# Patient Record
Sex: Male | Born: 1985 | Race: Black or African American | Hispanic: No | Marital: Single | Smoking: Former smoker
Health system: Southern US, Community
[De-identification: ages and names within clinical notes are randomized; demographics above are authoritative.]

## PROBLEM LIST (undated history)

## (undated) DIAGNOSIS — I509 Heart failure, unspecified: Secondary | ICD-10-CM

## (undated) DIAGNOSIS — R569 Unspecified convulsions: Secondary | ICD-10-CM

## (undated) DIAGNOSIS — I5022 Chronic systolic (congestive) heart failure: Secondary | ICD-10-CM

## (undated) DIAGNOSIS — I10 Essential (primary) hypertension: Secondary | ICD-10-CM

## (undated) HISTORY — PX: OTHER SURGICAL HISTORY: SHX169

## (undated) HISTORY — PX: FINGER AMPUTATION: SHX636

---

## 2011-11-24 ENCOUNTER — Emergency Department (HOSPITAL_BASED_OUTPATIENT_CLINIC_OR_DEPARTMENT_OTHER)
Admission: EM | Admit: 2011-11-24 | Discharge: 2011-11-24 | Disposition: A | Discharge status: Home / Self Care | Payer: Medicaid Other | Attending: Emergency Medicine | Admitting: Emergency Medicine

## 2011-11-24 ENCOUNTER — Encounter (HOSPITAL_BASED_OUTPATIENT_CLINIC_OR_DEPARTMENT_OTHER): Payer: Self-pay | Admitting: *Deleted

## 2011-11-24 ENCOUNTER — Emergency Department (HOSPITAL_BASED_OUTPATIENT_CLINIC_OR_DEPARTMENT_OTHER): Payer: Medicaid Other

## 2011-11-24 DIAGNOSIS — F17200 Nicotine dependence, unspecified, uncomplicated: Secondary | ICD-10-CM | POA: Insufficient documentation

## 2011-11-24 DIAGNOSIS — I10 Essential (primary) hypertension: Secondary | ICD-10-CM | POA: Insufficient documentation

## 2011-11-24 DIAGNOSIS — M79609 Pain in unspecified limb: Secondary | ICD-10-CM | POA: Insufficient documentation

## 2011-11-24 DIAGNOSIS — M79671 Pain in right foot: Secondary | ICD-10-CM

## 2011-11-24 MED ORDER — INDOMETHACIN 25 MG PO CAPS: ORAL_CAPSULE | ORAL | Status: DC

## 2011-11-24 MED ORDER — HYDROCHLOROTHIAZIDE 25 MG PO TABS: 25.0000 mg | ORAL_TABLET | Freq: Every day | ORAL | Status: DC

## 2011-11-24 NOTE — ED Notes (Signed)
Pt c/o right foot pain with swelling and redness no injury

## 2011-11-24 NOTE — ED Notes (Signed)
Patient back from  X-ray 

## 2011-11-24 NOTE — ED Provider Notes (Signed)
History   This chart was scribed for Mark Cooper III, MD by Sofie Rower. The patient was seen in room MH08/MH08 and the patient's care was started at 4:18PM.     CSN: [id number]  Arrival date & time 11/24/11  1425   First MD Initiated Contact with Patient 11/24/11 1618      Chief Complaint  Patient presents with  . Foot Pain    (Consider location/radiation/quality/duration/timing/severity/associated sxs/prior treatment) Patient is a 26 y.o. male presenting with lower extremity pain. The history is provided by the patient. No language interpreter was used.  Foot Pain This is a new problem. The current episode started more than 2 days ago (One week ago. ). The problem occurs constantly. The problem has been gradually worsening. Pertinent negatives include no chest pain. The symptoms are aggravated by walking and standing. The symptoms are relieved by rest. He has tried rest for the symptoms. The treatment provided moderate relief.    Ellwood Garcia is a 26 y.o. male , with a hx of hypertension, who presents to the Emergency Department complaining of sudden, progressively worsening, foot pain located at the right foot, onset one week ago.  Associated symptoms include swelling and erythema located at the right foot. The pt describes the foot pain he is experiencing as a sharp pain, which is intensified by walking or placing pressure upon the right foot. The pt has a familial hx of rheumatoid arthritis (mother).  The pt denies fever, earache, sore throat, cough, chest pain, vomiting, diarrhea, difficulty urinating, rash, fainting, LOC, and seizure or convulsion activity. Additionally, pt denies any injury to the right foot, any hx of gout, and any hx of surgical procedures.    The pt is a current some day smoker, however, he does not drink alcohol.   Pt does not have a PCP.    History reviewed. No pertinent past medical history.  Past Surgical History  Procedure Date  . Other surgical  history     figner amputation    History reviewed. No pertinent family history.  History  Substance Use Topics  . Smoking status: Current Some Day Smoker  . Smokeless tobacco: Not on file  . Alcohol Use: No      Review of Systems  Cardiovascular: Negative for chest pain.  All other systems reviewed and are negative.    Allergies  Review of patient's allergies indicates no known allergies.  Home Medications  No current outpatient prescriptions on file.  BP 153/102  Pulse 82  Temp 98.3 F (36.8 C) (Oral)  Resp 16  Ht 5\' 9"  (1.753 m)  Wt 285 lb (129.275 kg)  BMI 42.09 kg/m2  SpO2 99%  Physical Exam  Nursing note and vitals reviewed. Constitutional: He is oriented to person, place, and time. He appears well-developed and well-nourished.       Pt is morbidly obese.   HENT:  Head: Atraumatic.  Right Ear: Tympanic membrane normal.  Left Ear: Tympanic membrane normal.  Nose: Nose normal.  Eyes: Conjunctivae normal and EOM are normal. Pupils are equal, round, and reactive to light.  Neck: Normal range of motion.  Cardiovascular: Normal rate, regular rhythm and normal heart sounds.   Pulmonary/Chest: Effort normal and breath sounds normal.  Abdominal: Soft. Bowel sounds are normal. There is no tenderness.  Musculoskeletal: Normal range of motion. He exhibits tenderness.       Right foot plantar surface over the 5th metatarsal phalangeal joint. No palpable deformity detected. No mass detected. No erythema or  warmth detected. Intact sensation. Dorsalis pedis and posterior tibial pulses are intact.   Neurological: He is alert and oriented to person, place, and time.  Skin: Skin is warm and dry.  Psychiatric: He has a normal mood and affect. His behavior is normal.    ED Course  Procedures (including critical care time)  DIAGNOSTIC STUDIES: Oxygen Saturation is 99% on room, normal by my interpretation.    COORDINATION OF CARE:   4:28 PM- Treatment plan concerning  x-ray of right foot, blood work, and management of hypertension discussed with patient. Pt agrees with treatment.   5:18 PM- Treatment plan concerning x-ray results discussed with patient. Pt agrees with treatment.       Results for orders placed during the hospital encounter of 11/24/11  URIC ACID      Component Value Range   Uric Acid, Serum 5.9  4.0 - 7.8 mg/dL  SEDIMENTATION RATE      Component Value Range   Sed Rate 5  0 - 16 mm/hr   Dg Foot Complete Right  11/24/2011  *RADIOLOGY REPORT*  Clinical Data: right foot pain over the plantar surface.  Pain over the fifth MTP joint.  RIGHT FOOT COMPLETE - 3+ VIEW  Comparison: None.  Findings: Anatomic alignment bones of the right foot.  There is no fracture.  Soft tissues appear within normal limits.  Small toe phalanges and fifth MCP joint appear within normal limits.  No radiopaque foreign body.  IMPRESSION: Negative.   Original Report Authenticated By: Andreas Newport, M.D.       1. Pain in right foot   2. Hypertension      I personally performed the services described in this documentation, which was scribed in my presence. The recorded information has been reviewed and considered.  Mark Human, MD     Mark Cooper III, MD 11/24/11 [phone]

## 2012-03-13 ENCOUNTER — Encounter (HOSPITAL_BASED_OUTPATIENT_CLINIC_OR_DEPARTMENT_OTHER): Payer: Self-pay | Admitting: Emergency Medicine

## 2012-03-13 ENCOUNTER — Emergency Department (HOSPITAL_BASED_OUTPATIENT_CLINIC_OR_DEPARTMENT_OTHER)
Admission: EM | Admit: 2012-03-13 | Discharge: 2012-03-13 | Disposition: A | Discharge status: Home / Self Care | Payer: Medicaid Other | Attending: Emergency Medicine | Admitting: Emergency Medicine

## 2012-03-13 ENCOUNTER — Emergency Department (HOSPITAL_BASED_OUTPATIENT_CLINIC_OR_DEPARTMENT_OTHER): Payer: Medicaid Other

## 2012-03-13 DIAGNOSIS — Z79899 Other long term (current) drug therapy: Secondary | ICD-10-CM | POA: Insufficient documentation

## 2012-03-13 DIAGNOSIS — I10 Essential (primary) hypertension: Secondary | ICD-10-CM | POA: Insufficient documentation

## 2012-03-13 DIAGNOSIS — R202 Paresthesia of skin: Secondary | ICD-10-CM

## 2012-03-13 DIAGNOSIS — R209 Unspecified disturbances of skin sensation: Secondary | ICD-10-CM | POA: Insufficient documentation

## 2012-03-13 DIAGNOSIS — F17200 Nicotine dependence, unspecified, uncomplicated: Secondary | ICD-10-CM | POA: Insufficient documentation

## 2012-03-13 HISTORY — DX: Essential (primary) hypertension: I10

## 2012-03-13 NOTE — ED Notes (Signed)
Pt started to have facial numbness at 0900.  No other symptoms noted.

## 2012-03-13 NOTE — ED Provider Notes (Signed)
History     CSN: [id number]  Arrival date & time 03/13/12  1042   First MD Initiated Contact with Patient 03/13/12 1115      Chief Complaint  Patient presents with  . Numbness    (Consider location/radiation/quality/duration/timing/severity/associated sxs/prior treatment) HPI Patient with numbness to right side of face began this a.m. At work. He states it is mainly beside his right eye.  He denies weakness, visual changes, difficulty speaking, or swallowing.  He has a history of hypertension and is treated by Dr. Lovell Sheehan.  He states he is taking his medicine as prescribed.  No famly history of clotting abnormalities strokes.   Past Medical History  Diagnosis Date  . Hypertension     Past Surgical History  Procedure Laterality Date  . Other surgical history      figner amputation  . Finger amputation      No family history on file.  History  Substance Use Topics  . Smoking status: Current Some Day Smoker  . Smokeless tobacco: Not on file  . Alcohol Use: No      Review of Systems  All other systems reviewed and are negative.    Allergies  Review of patient's allergies indicates no known allergies.  Home Medications   Current Outpatient Rx  Name  Route  Sig  Dispense  Refill  . potassium chloride (K-DUR) 10 MEQ tablet   Oral   Take 10 mEq by mouth 2 (two) times daily.         . hydrochlorothiazide (HYDRODIURIL) 25 MG tablet   Oral   Take 1 tablet (25 mg total) by mouth daily.   30 tablet   2     BP 150/96  Pulse 76  Temp(Src) 98.5 F (36.9 C) (Oral)  Resp 20  Ht 5\' 11"  (1.803 m)  Wt 270 lb (122.471 kg)  BMI 37.67 kg/m2  SpO2 100%  Physical Exam  Nursing note and vitals reviewed. Constitutional: He appears well-developed and well-nourished.  HENT:  Head: Normocephalic and atraumatic.  Right Ear: External ear normal.  Left Ear: External ear normal.  Nose: Nose normal.  Mouth/Throat: Oropharynx is clear and moist.  Eyes: Conjunctivae and  EOM are normal. Pupils are equal, round, and reactive to light.  Neck: Normal range of motion. Neck supple.  Cardiovascular: Normal rate, regular rhythm, normal heart sounds and intact distal pulses.   Pulmonary/Chest: Effort normal.  Abdominal: Soft. Bowel sounds are normal.  Musculoskeletal: Normal range of motion.  Neurological: He is alert. He has normal strength and normal reflexes. No sensory deficit. He displays a negative Romberg sign. Coordination and gait normal. GCS eye subscore is 4. GCS verbal subscore is 5. GCS motor subscore is 6.  No abnormality of sharp or light sensation throughout face , bilateral ue, or lower extremities.      ED Course  Procedures (including critical care time)  Labs Reviewed - No data to display Ct Head Wo Contrast  03/13/2012  *RADIOLOGY REPORT*  Clinical Data: 27 year old male right facial numbness and 9:00 a.m.  CT HEAD WITHOUT CONTRAST  Technique:  Contiguous axial images were obtained from the base of the skull through the vertex without contrast.  Comparison: None.  Findings: , Visualized paranasal sinuses and mastoids are clear. Visualized orbit soft tissues are within normal limits.  Visualized scalp soft tissues are within normal limits.  No acute osseous abnormality identified.  Cerebral volume is normal.  No midline shift, ventriculomegaly, mass effect, evidence of mass lesion, intracranial  hemorrhage or evidence of cortically based acute infarction.  Gray-white matter differentiation is within normal limits throughout the brain.  No suspicious intracranial vascular hyperdensity.  IMPRESSION: Normal noncontrast CT appearance of the brain.   Original Report Authenticated By: Erskine Speed, M.D.      No diagnosis found.    MDM  Normal neurologic exam, patient may have early signs of Bell's palsy.  Patient advised regarding s/s stroke to return to ed.       Hilario Quarry, MD 03/13/12 [phone]

## 2012-07-03 ENCOUNTER — Emergency Department (HOSPITAL_BASED_OUTPATIENT_CLINIC_OR_DEPARTMENT_OTHER)
Admission: EM | Admit: 2012-07-03 | Discharge: 2012-07-03 | Disposition: A | Discharge status: Home / Self Care | Payer: Medicaid Other | Attending: Emergency Medicine | Admitting: Emergency Medicine

## 2012-07-03 ENCOUNTER — Encounter (HOSPITAL_BASED_OUTPATIENT_CLINIC_OR_DEPARTMENT_OTHER): Payer: Self-pay | Admitting: *Deleted

## 2012-07-03 DIAGNOSIS — Z760 Encounter for issue of repeat prescription: Secondary | ICD-10-CM | POA: Insufficient documentation

## 2012-07-03 DIAGNOSIS — R05 Cough: Secondary | ICD-10-CM

## 2012-07-03 DIAGNOSIS — Z87891 Personal history of nicotine dependence: Secondary | ICD-10-CM | POA: Insufficient documentation

## 2012-07-03 DIAGNOSIS — R059 Cough, unspecified: Secondary | ICD-10-CM | POA: Insufficient documentation

## 2012-07-03 DIAGNOSIS — S68118A Complete traumatic metacarpophalangeal amputation of other finger, initial encounter: Secondary | ICD-10-CM | POA: Insufficient documentation

## 2012-07-03 DIAGNOSIS — Z79899 Other long term (current) drug therapy: Secondary | ICD-10-CM | POA: Insufficient documentation

## 2012-07-03 DIAGNOSIS — J302 Other seasonal allergic rhinitis: Secondary | ICD-10-CM

## 2012-07-03 DIAGNOSIS — R6889 Other general symptoms and signs: Secondary | ICD-10-CM | POA: Insufficient documentation

## 2012-07-03 DIAGNOSIS — R0982 Postnasal drip: Secondary | ICD-10-CM | POA: Insufficient documentation

## 2012-07-03 DIAGNOSIS — J309 Allergic rhinitis, unspecified: Secondary | ICD-10-CM | POA: Insufficient documentation

## 2012-07-03 DIAGNOSIS — J3489 Other specified disorders of nose and nasal sinuses: Secondary | ICD-10-CM | POA: Insufficient documentation

## 2012-07-03 DIAGNOSIS — I10 Essential (primary) hypertension: Secondary | ICD-10-CM | POA: Insufficient documentation

## 2012-07-03 MED ORDER — HYDROCHLOROTHIAZIDE 25 MG PO TABS: 25.0000 mg | ORAL_TABLET | Freq: Every day | ORAL | Status: DC

## 2012-07-03 MED ORDER — POTASSIUM CHLORIDE ER 10 MEQ PO TBCR: 10.0000 meq | EXTENDED_RELEASE_TABLET | Freq: Two times a day (BID) | ORAL | Status: DC

## 2012-07-03 NOTE — ED Notes (Addendum)
C/o dry  cough and sneezing since yesterday and general body "soreness" c/o general h/a. Denies any fevers. States he is out of his b/p meds and potassium pill

## 2012-07-03 NOTE — ED Provider Notes (Signed)
History     CSN: [id number]  Arrival date & time 07/03/12  7829   First MD Initiated Contact with Patient 07/03/12 2157      Chief Complaint  Patient presents with  . Headache    (Consider location/radiation/quality/duration/timing/severity/associated sxs/prior treatment) HPI Comments: 27 year old male with a past medical history of hypertension and seasonal allergies presents to the emergency department complaining of sinus headache and congestion, congested nose, dry cough and sneezing beginning yesterday. He has not tried any alleviating factors for his symptoms. Denies fever or chills. States his fiance has the stomach bug and he wants to make sure that he is not sick. Admits to having seasonal allergies, however no longer takes a daily allergy pill. Also out of his high blood pressure medications as he does not have a primary care physician at this time.  Patient is a 27 y.o. male presenting with headaches. The history is provided by the patient.  Headache Associated symptoms: congestion, cough, drainage and sinus pressure   Associated symptoms: no fever     Past Medical History  Diagnosis Date  . Hypertension     Past Surgical History  Procedure Laterality Date  . Other surgical history      figner amputation  . Finger amputation      No family history on file.  History  Substance Use Topics  . Smoking status: Former Games developer  . Smokeless tobacco: Not on file  . Alcohol Use: No      Review of Systems  Constitutional: Negative for fever and chills.  HENT: Positive for congestion, rhinorrhea, postnasal drip and sinus pressure.   Respiratory: Positive for cough. Negative for shortness of breath and wheezing.   Cardiovascular: Negative for chest pain.  Neurological: Positive for headaches.  All other systems reviewed and are negative.    Allergies  Review of patient's allergies indicates no known allergies.  Home Medications   Current Outpatient Rx  Name   Route  Sig  Dispense  Refill  . hydrochlorothiazide (HYDRODIURIL) 25 MG tablet   Oral   Take 1 tablet (25 mg total) by mouth daily.   30 tablet   2   . potassium chloride (K-DUR) 10 MEQ tablet   Oral   Take 10 mEq by mouth 2 (two) times daily.           BP 141/90  Pulse 83  Temp(Src) 99 F (37.2 C) (Oral)  Resp 16  Ht 5\' 10"  (1.778 m)  Wt 237 lb (107.502 kg)  BMI 34.01 kg/m2  SpO2 96%  Physical Exam  Constitutional: He is oriented to person, place, and time. He appears well-developed and well-nourished. No distress.  HENT:  Head: Normocephalic and atraumatic.  Nose: Mucosal edema present. Right sinus exhibits frontal sinus tenderness. Left sinus exhibits frontal sinus tenderness.  Mouth/Throat: Uvula is midline and mucous membranes are normal. Posterior oropharyngeal erythema present. No oropharyngeal exudate or posterior oropharyngeal edema.  Post nasal drip present.  Eyes: Conjunctivae are normal.  Neck: Normal range of motion. Neck supple.  Cardiovascular: Normal rate, regular rhythm and normal heart sounds.   Pulmonary/Chest: Effort normal and breath sounds normal.  Musculoskeletal: Normal range of motion. He exhibits no edema.  Lymphadenopathy:    He has no cervical adenopathy.  Neurological: He is alert and oriented to person, place, and time.  Skin: Skin is warm and dry. He is not diaphoretic.  Psychiatric: He has a normal mood and affect. His behavior is normal.    ED Course  Procedures (including critical care time)  Labs Reviewed - No data to display No results found.   1. Seasonal allergies   2. Cough   3. Medication refill       MDM  27 year old male with seasonal allergies. No signs of infection on exam. He is in no apparent distress. Advised allergy pill daily along with nasal saline rinsing. Blood pressure medication refilled, resource guide given for PCP followup. Patient states understanding of plan and is agreeable.        Trevor Mace, PA-C 07/03/12 2231

## 2012-07-04 NOTE — ED Provider Notes (Signed)
Medical screening examination/treatment/procedure(s) were performed by non-physician practitioner and as supervising physician I was immediately available for consultation/collaboration.  Yanitza Shvartsman, MD 07/04/12 0808 

## 2013-07-03 ENCOUNTER — Encounter (HOSPITAL_COMMUNITY): Payer: Self-pay | Admitting: Emergency Medicine

## 2013-07-03 ENCOUNTER — Emergency Department (HOSPITAL_COMMUNITY)
Admission: EM | Admit: 2013-07-03 | Discharge: 2013-07-03 | Disposition: A | Discharge status: Home / Self Care | Payer: Medicaid Other | Attending: Emergency Medicine | Admitting: Emergency Medicine

## 2013-07-03 DIAGNOSIS — Z9119 Patient's noncompliance with other medical treatment and regimen: Secondary | ICD-10-CM | POA: Insufficient documentation

## 2013-07-03 DIAGNOSIS — Z87891 Personal history of nicotine dependence: Secondary | ICD-10-CM | POA: Insufficient documentation

## 2013-07-03 DIAGNOSIS — Z91199 Patient's noncompliance with other medical treatment and regimen due to unspecified reason: Secondary | ICD-10-CM | POA: Insufficient documentation

## 2013-07-03 DIAGNOSIS — I10 Essential (primary) hypertension: Secondary | ICD-10-CM

## 2013-07-03 MED ORDER — LISINOPRIL 5 MG PO TABS: 5.0000 mg | ORAL_TABLET | Freq: Every day | ORAL | Status: DC

## 2013-07-03 NOTE — ED Notes (Signed)
Pt. Stated, I went to donate Plasma and they said my BP is too high

## 2013-07-03 NOTE — ED Provider Notes (Signed)
CSN: [id number]     Arrival date & time 07/03/13  1418 History   First MD Initiated Contact with Patient 07/03/13 1557     Chief Complaint  Patient presents with  . Hypertension     (Consider location/radiation/quality/duration/timing/severity/associated sxs/prior Treatment) HPI Comments: He tried to donate plasma and he was sent to the ER because his blood pressure was too high. He has been treated for hypertension previously with lisinopril. He said he was sent for worked well for him. He has not been taking it for 6 months to one year. He denies any headaches, shortness of breath, chest pain, blurry vision.  Patient is a 28 y.o. male presenting with hypertension. The history is provided by the patient.  Hypertension This is a chronic problem. The current episode started more than 1 week ago. The problem occurs constantly. The problem has not changed since onset.Pertinent negatives include no chest pain, no abdominal pain, no headaches and no shortness of breath. Nothing aggravates the symptoms. Nothing relieves the symptoms.    Past Medical History  Diagnosis Date  . Hypertension    Past Surgical History  Procedure Laterality Date  . Other surgical history      figner amputation  . Finger amputation     No family history on file. History  Substance Use Topics  . Smoking status: Former Games developer  . Smokeless tobacco: Not on file  . Alcohol Use: No    Review of Systems  Constitutional: Negative for fever and chills.  Respiratory: Negative for shortness of breath.   Cardiovascular: Negative for chest pain.  Gastrointestinal: Negative for abdominal pain.  Neurological: Negative for headaches.  All other systems reviewed and are negative.     Allergies  Review of patient's allergies indicates no known allergies.  Home Medications   Prior to Admission medications   Not on File   BP 158/84  Pulse 83  Temp(Src) 98.7 F (37.1 C) (Oral)  Resp 18  SpO2 97% Physical  Exam  Nursing note and vitals reviewed. Constitutional: He is oriented to person, place, and time. He appears well-developed and well-nourished. No distress.  HENT:  Head: Normocephalic and atraumatic.  Mouth/Throat: No oropharyngeal exudate.  Eyes: EOM are normal. Pupils are equal, round, and reactive to light.  Neck: Normal range of motion. Neck supple.  Cardiovascular: Normal rate and regular rhythm.  Exam reveals no friction rub.   No murmur heard. Pulmonary/Chest: Effort normal and breath sounds normal. No respiratory distress. He has no wheezes. He has no rales.  Abdominal: Soft. He exhibits no distension. There is no tenderness. There is no rebound.  Musculoskeletal: Normal range of motion. He exhibits no edema.  Neurological: He is alert and oriented to person, place, and time.  Skin: No rash noted. He is not diaphoretic.    ED Course  Procedures (including critical care time) Labs Review Labs Reviewed - No data to display  Imaging Review No results found.   EKG Interpretation None      MDM   Final diagnoses:  Hypertension    28 year old male here with asymptomatic hypertension. He is noncompliant with his medications. He also does not have a PCP at this time. We'll restart him on his lisinopril, 5 mg daily, which he stated worked very well for him. I counseled him on quitting smoking, changing his diet, exercise. Given resource guide to establish followup care. No hx of angioedema with his lisinopril.    Dagmar Hait, MD 07/03/13 [phone]

## 2013-07-03 NOTE — Discharge Instructions (Signed)
Hypertension As your heart beats, it forces blood through your arteries. This force is your blood pressure. If the pressure is too high, it is called hypertension (HTN) or high blood pressure. HTN is dangerous because you may have it and not know it. High blood pressure may mean that your heart has to work harder to pump blood. Your arteries may be narrow or stiff. The extra work puts you at risk for heart disease, stroke, and other problems.  Blood pressure consists of two numbers, a higher number over a lower, 110/72, for example. It is stated as "110 over 72." The ideal is below 120 for the top number (systolic) and under 80 for the bottom (diastolic). Write down your blood pressure today. You should pay close attention to your blood pressure if you have certain conditions such as:  Heart failure.  Prior heart attack.  Diabetes  Chronic kidney disease.  Prior stroke.  Multiple risk factors for heart disease. To see if you have HTN, your blood pressure should be measured while you are seated with your arm held at the level of the heart. It should be measured at least twice. A one-time elevated blood pressure reading (especially in the Emergency Department) does not mean that you need treatment. There may be conditions in which the blood pressure is different between your right and left arms. It is important to see your caregiver soon for a recheck. Most people have essential hypertension which means that there is not a specific cause. This type of high blood pressure may be lowered by changing lifestyle factors such as:  Stress.  Smoking.  Lack of exercise.  Excessive weight.  Drug/tobacco/alcohol use.  Eating less salt. Most people do not have symptoms from high blood pressure until it has caused damage to the body. Effective treatment can often prevent, delay or reduce that damage. TREATMENT  When a cause has been identified, treatment for high blood pressure is directed at the  cause. There are a large number of medications to treat HTN. These fall into several categories, and your caregiver will help you select the medicines that are best for you. Medications may have side effects. You should review side effects with your caregiver. If your blood pressure stays high after you have made lifestyle changes or started on medicines,   Your medication(s) may need to be changed.  Other problems may need to be addressed.  Be certain you understand your prescriptions, and know how and when to take your medicine.  Be sure to follow up with your caregiver within the time frame advised (usually within two weeks) to have your blood pressure rechecked and to review your medications.  If you are taking more than one medicine to lower your blood pressure, make sure you know how and at what times they should be taken. Taking two medicines at the same time can result in blood pressure that is too low. SEEK IMMEDIATE MEDICAL CARE IF:  You develop a severe headache, blurred or changing vision, or confusion.  You have unusual weakness or numbness, or a faint feeling.  You have severe chest or abdominal pain, vomiting, or breathing problems. MAKE SURE YOU:   Understand these instructions.  Will watch your condition.  Will get help right away if you are not doing well or get worse. Document Released: 01/20/2005 Document Revised: 04/14/2011 Document Reviewed: 09/10/2007 [facility] Patient Information 2014 [place].   Emergency Department Resource Guide 1) Find a Doctor and Pay Out of Pocket Although  you won't have to find out who is covered by your insurance plan, it is a good idea to ask around and get recommendations. You will then need to call the office and see if the doctor you have chosen will accept you as a new patient and what types of options they offer for patients who are self-pay. Some doctors offer discounts or will set up payment plans for their patients who do  not have insurance, but you will need to ask so you aren't surprised when you get to your appointment.  2) Contact Your Local Health Department Not all health departments have doctors that can see patients for sick visits, but many do, so it is worth a call to see if yours does. If you don't know where your local health department is, you can check in your phone book. The CDC also has a tool to help you locate your state's health department, and many state websites also have listings of all of their local health departments.  3) Find a [facility] If your illness is not likely to be very severe or complicated, you may want to try a walk in clinic. These are popping up all over the country in pharmacies, drugstores, and shopping centers. They're usually staffed by nurse practitioners or physician assistants that have been trained to treat common illnesses and complaints. They're usually fairly quick and inexpensive. However, if you have serious medical issues or chronic medical problems, these are probably not your best option.  No Primary Care Doctor: - Call Health Connect at  [phone] - they can help you locate a primary care doctor that  accepts your insurance, provides certain services, etc. - Physician Referral Service- [phone]  Chronic Pain Problems: Organization         Address  Phone   Notes  [facility]  [phone] Patients need to be referred by their primary care doctor.   Medication Assistance: Organization         Address  Phone   Notes  [facility] Medication [facility] [place], [place] [address] [phone] --Must be a resident of [facility] -- Must have NO insurance coverage whatsoever (no Medicaid/ Medicare, etc.) -- The pt. MUST have a primary care doctor that directs their care regularly and follows them in the community   [facility]ist  [phone]   [employer]  [phone]    Agencies that  provide inexpensive medical care: Organization         Address  Phone   Notes  [place]  [phone]   [facility]    [phone]   [facility] [place],  [phone]   [place] [address] [place] [phone]   Planned Parenthood    [phone]   [facility]    [phone]   [place] and [address], [place] Phone:  [phone], Fax:  [phone] Hours of Operation:  9 am - 6 pm, M-F.  Also accepts Medicaid/Medicare and self-pay.  [facility] for [address], [address], [place] Phone: [phone], Fax: [phone]. Hours of Operation:  8:30 am - 5:30 pm, M-F.  Also accepts Medicaid and self-pay.  [facility] [place] [address], [place] Phone: [phone]   [place]  [address], [place], [place] [phone], Ext. [phone] Mondays & Thursdays: 7-9 AM.  First 15 patients are seen on a first come, first serve basis. °  ° °Medicaid-accepting [place] Providers: ° °Organization         Address  Phone   Notes  °[facility] [address], [place] [phone] Also accepts self-pay patients.  °[facility] [address], [place] ° ([phone]) [phone]   °[facility] [address], [place] [phone]) [phone]   °[facility] [address] [place] [address], [place] [phone]) [phone]   °Veita Bland [address], [place]  ° ([phone] Only accepts [place] Access Medicaid patients after they have their name applied to their card.  ° °Self-Pay (no insurance) in [place]: ° °Organization         Address  Phone   Notes  °Sickle Cell Patients, [facility] [address], [place] [phone]) [phone]   °[facility] [address], [place] [phone]) [phone]   °[facility] [place] ° [address], [place] [phone]) [phone]   °[facility]/Dr. Osei-Bonsu ° [address] [place] [address], [place] or [address], [place] [phone] Phone number for both [place] and [place] locations is the same.  °[facility] [address], [place] [phone]   °[facility] [address] [place] [address], [place] or [address] [phone]) [phone] °([phone]) [phone]   °[facility] [address], [place] ([phone]) [phone], phone; ([phone]) [phone], fax Sees patients 1st and 3rd Saturday of every month.  Must not qualify for public or private insurance (i.e. Medicaid, Medicare, [place] Health Choice, Veterans' Benefits) • Household income should be no more than 200% of the poverty level •The clinic cannot treat you if you are pregnant or think you are pregnant • Sexually transmitted diseases are not treated at the clinic.  ° ° °Dental Care: °Organization         Address  Phone  Notes  °[place] [facility] [address], [place] [phone]) [phone] Accepts children up to age 21 who are enrolled in Medicaid or [place] Health Choice; pregnant women with a Medicaid card; and children who have applied for Medicaid or [place] Health Choice, but were declined, whose parents can pay a reduced fee at time of service.  °[place] [facility] [place]  [address], [place] [phone]) [phone] Accepts children up to age 21 who are enrolled in Medicaid or [place] Health Choice; pregnant women with a Medicaid card; and children who have applied for Medicaid or  Health Choice, but were declined, whose parents can pay a reduced fee at time of service.  °[facility] ° [address], [place] [phone]) [phone] Patients are seen by appointment only. Walk-ins  are not accepted. [facility] will see patients 18 years of age and older. °Monday - Tuesday (8am-5pm) °Most Wednesdays (8:30-5pm) °$30 per visit, cash only  °[facility] ° [address], [place] [phone]) [phone] Patients are seen by appointment only. Walk-ins are not accepted. [facility] will see patients 18 years of age and older. °One Wednesday Evening (Monthly: Volunteer Based).  $30 per visit, cash only  °[facility] [employer]  [phone] for adults; Children under age 30, call Graduate Pediatric Dentistry at [phone]. Children aged 38-14, please call [phone] to request a pediatric application.  Dental services are provided in all areas of dental care including fillings, crowns and bridges, complete and partial dentures, implants, gum treatment, root canals, and extractions. Preventive care is also provided. Treatment is provided to both adults and children. Patients are selected via a lottery and there is often a waiting list.   [facility] [address], [place]  [phone] [email or web address]   [facility] [address] [place], [place] [phone], Ext. [phone] Second and Fourth Thursday of each month, opens at 6:30 AM; Clinic ends at 9 AM.  Patients are seen on a first-come first-served basis, and a limited number are seen during each clinic.   [facility]  [address] Hillard Danker [place], [place] [phone]   Eligibility Requirements You must have lived in [place], [place], or [place] counties for at least the last three months.   You cannot be eligible for state or federal sponsored [employer], including [employer], [place], or [employer].   You generally cannot be eligible for healthcare insurance through your employer.    How to apply: Eligibility screenings are held every Tuesday and Wednesday afternoon from 1:00 pm until 4:00 pm. You do not need an appointment  for the interview!  [facility] [address], [place], [place]   [place]  [place]iz Department  [place]  [phone]    Behavioral Health Resources in the Community: Intensive Outpatient Programs Organization         Address  Phone  Notes  [place]. [address] [place], [place] [phone]   [facility] [address], [place], [place]   ADS: [facility] [address] [place], [place]   [place] [address]  [place], [place] or [phone]   Substance Abuse Resources Organization         Address  Phone  Notes  [facility]  [phone])[phone]   [place]  ([phone]) [phone]   [facility] [place]   Chinita Pester  [phone]   Residential & Outpatient Substance Abuse Program  [phone]   Psychological Services Organization         Address  Phone  Notes  [facility] [place]  [place]  [phone]   [place] [address], [place] or [phone]    Mobile Crisis Teams Organization         Address  Phone  Notes  [facility], Mobile Crisis Care Unit  [phone]   [facility]  [address] [place], [place]   [place] [address] [place] [phone]    Self-Help/Support Groups Organization         Address  Phone             Notes  [place]. of [place] - variety of support groups  Sellers Call for more information  Narcotics Anonymous (NA), [facility] [address], [place]  2 meetings at this location   Special educational needs teacher  Address  Phone  Notes  [facility]  Treatment [address],    [place]  1-(217) [phone]   [facility]  [address], [place] [address], [place], [place]   Salcha Rogers, [place] [phone] Admissions: 8am-3pm M-F  Incentives Substance [place] [address],    [place], [place] [phone]   [facility] [address] [place], [place], [place]   [facility] [address]  [place], [place]   [facility] - Intensive Outpatient [address], [place], [place]   [facility] [place].,  [place], [place] [phone]-(223) [phone] or [phone]   [facility] ([facility]) [address] [place], [place] Accepts Medicaid  [facility] [place] [address]  [place] [phone] Substance Abuse/Addiction Treatment   [facility] Organization         Address  Phone  Notes  [facility]  [phone]   Domenic Schwab, PhD [address] Arlis Porta [place], [place]   [phone] or [phone]   [place], [place] [phone]   [facility] [address] [place], [place] [phone] Insurance/Medicaid/sponsorship through [facility] and Families [address]                                    [place], [place] [phone] [place] [address], [place] [phone]    Dr. Adele Schilder  [phone]   [facility] Dept. 1) [address] [place] 2) [place] 3)  [address] [address], [place] [phone]  [phone]   [place] [phone] or [phone] (After Hours)

## 2019-01-26 ENCOUNTER — Ambulatory Visit: Payer: HRSA Program | Attending: Internal Medicine

## 2019-01-26 DIAGNOSIS — Z20828 Contact with and (suspected) exposure to other viral communicable diseases: Secondary | ICD-10-CM | POA: Diagnosis present

## 2019-01-26 DIAGNOSIS — Z20822 Contact with and (suspected) exposure to covid-19: Secondary | ICD-10-CM

## 2019-01-27 LAB — NOVEL CORONAVIRUS, NAA: SARS-CoV-2, NAA: NOT DETECTED

## 2019-02-17 ENCOUNTER — Encounter (HOSPITAL_BASED_OUTPATIENT_CLINIC_OR_DEPARTMENT_OTHER): Payer: Self-pay | Admitting: Emergency Medicine

## 2019-02-17 ENCOUNTER — Emergency Department (HOSPITAL_BASED_OUTPATIENT_CLINIC_OR_DEPARTMENT_OTHER): Payer: Self-pay

## 2019-02-17 ENCOUNTER — Observation Stay (HOSPITAL_BASED_OUTPATIENT_CLINIC_OR_DEPARTMENT_OTHER)
Admission: EM | Admit: 2019-02-17 | Discharge: 2019-02-18 | Disposition: A | Discharge status: Home / Self Care | Payer: Self-pay | Attending: Cardiology | Admitting: Cardiology

## 2019-02-17 ENCOUNTER — Other Ambulatory Visit: Payer: Self-pay

## 2019-02-17 DIAGNOSIS — I5021 Acute systolic (congestive) heart failure: Secondary | ICD-10-CM

## 2019-02-17 DIAGNOSIS — I110 Hypertensive heart disease with heart failure: Principal | ICD-10-CM

## 2019-02-17 DIAGNOSIS — R918 Other nonspecific abnormal finding of lung field: Secondary | ICD-10-CM | POA: Insufficient documentation

## 2019-02-17 DIAGNOSIS — Z79899 Other long term (current) drug therapy: Secondary | ICD-10-CM | POA: Insufficient documentation

## 2019-02-17 DIAGNOSIS — Z20822 Contact with and (suspected) exposure to covid-19: Secondary | ICD-10-CM | POA: Insufficient documentation

## 2019-02-17 DIAGNOSIS — I340 Nonrheumatic mitral (valve) insufficiency: Secondary | ICD-10-CM | POA: Insufficient documentation

## 2019-02-17 DIAGNOSIS — Z87891 Personal history of nicotine dependence: Secondary | ICD-10-CM | POA: Insufficient documentation

## 2019-02-17 DIAGNOSIS — I509 Heart failure, unspecified: Secondary | ICD-10-CM

## 2019-02-17 DIAGNOSIS — Z6841 Body Mass Index (BMI) 40.0 and over, adult: Secondary | ICD-10-CM | POA: Insufficient documentation

## 2019-02-17 LAB — CBC WITH DIFFERENTIAL/PLATELET
Abs Immature Granulocytes: 0.01 10*3/uL (ref 0.00–0.07)
Basophils Absolute: 0 10*3/uL (ref 0.0–0.1)
Basophils Relative: 1 %
Eosinophils Absolute: 0.2 10*3/uL (ref 0.0–0.5)
Eosinophils Relative: 3 %
HCT: 45.2 % (ref 39.0–52.0)
Hemoglobin: 14.4 g/dL (ref 13.0–17.0)
Immature Granulocytes: 0 %
Lymphocytes Relative: 31 %
Lymphs Abs: 2 10*3/uL (ref 0.7–4.0)
MCH: 28 pg (ref 26.0–34.0)
MCHC: 31.9 g/dL (ref 30.0–36.0)
MCV: 87.8 fL (ref 80.0–100.0)
Monocytes Absolute: 0.6 10*3/uL (ref 0.1–1.0)
Monocytes Relative: 9 %
Neutro Abs: 3.7 10*3/uL (ref 1.7–7.7)
Neutrophils Relative %: 56 %
Platelets: 213 10*3/uL (ref 150–400)
RBC: 5.15 MIL/uL (ref 4.22–5.81)
RDW: 12.4 % (ref 11.5–15.5)
WBC: 6.6 10*3/uL (ref 4.0–10.5)
nRBC: 0 % (ref 0.0–0.2)

## 2019-02-17 LAB — BASIC METABOLIC PANEL
Anion gap: 7 (ref 5–15)
BUN: 12 mg/dL (ref 6–20)
CO2: 30 mmol/L (ref 22–32)
Calcium: 9.5 mg/dL (ref 8.9–10.3)
Chloride: 102 mmol/L (ref 98–111)
Creatinine, Ser: 0.91 mg/dL (ref 0.61–1.24)
GFR calc Af Amer: >60 mL/min (ref >60)
GFR calc non Af Amer: >60 mL/min (ref >60)
Glucose, Bld: 106 mg/dL — ABNORMAL HIGH (ref 70–99)
Potassium: 4.5 mmol/L (ref 3.5–5.1)
Sodium: 139 mmol/L (ref 135–145)

## 2019-02-17 LAB — BRAIN NATRIURETIC PEPTIDE: B Natriuretic Peptide: 574.8 pg/mL — ABNORMAL HIGH (ref 0.0–100.0)

## 2019-02-17 LAB — D-DIMER, QUANTITATIVE (NOT AT ARMC): D-Dimer, Quant: 1.17 ug/mL-FEU — ABNORMAL HIGH (ref 0.00–0.50)

## 2019-02-17 LAB — TROPONIN I (HIGH SENSITIVITY)
Troponin I (High Sensitivity): 120 ng/L (ref <18)
Troponin I (High Sensitivity): 98 ng/L — ABNORMAL HIGH (ref <18)

## 2019-02-17 LAB — SARS CORONAVIRUS 2 (TAT 6-24 HRS): SARS Coronavirus 2: NEGATIVE

## 2019-02-17 LAB — HIV ANTIBODY (ROUTINE TESTING W REFLEX): HIV Screen 4th Generation wRfx: NONREACTIVE

## 2019-02-17 MED ORDER — IOHEXOL 350 MG/ML SOLN
100.0000 mL | Freq: Once | INTRAVENOUS | Status: AC | PRN
  Administered 2019-02-17: 15:00:00 100 mL via INTRAVENOUS

## 2019-02-17 MED ORDER — CARVEDILOL 12.5 MG PO TABS
12.5000 mg | ORAL_TABLET | Freq: Two times a day (BID) | ORAL | Status: DC
  Administered 2019-02-17 – 2019-02-18 (×2): 12.5 mg via ORAL
  Filled 2019-02-17 (×2): qty 1

## 2019-02-17 MED ORDER — SODIUM CHLORIDE 0.9% FLUSH: 3.0000 mL | INTRAVENOUS | Status: DC | PRN

## 2019-02-17 MED ORDER — ONDANSETRON HCL 4 MG/2ML IJ SOLN: 4.0000 mg | Freq: Four times a day (QID) | INTRAMUSCULAR | Status: DC | PRN

## 2019-02-17 MED ORDER — NITROGLYCERIN 0.4 MG SL SUBL: 0.4000 mg | SUBLINGUAL_TABLET | SUBLINGUAL | Status: DC | PRN

## 2019-02-17 MED ORDER — ENOXAPARIN SODIUM 80 MG/0.8ML ~~LOC~~ SOLN
70.0000 mg | SUBCUTANEOUS | Status: DC
  Administered 2019-02-17: 70 mg via SUBCUTANEOUS
  Filled 2019-02-17: qty 0.8

## 2019-02-17 MED ORDER — ZOLPIDEM TARTRATE 5 MG PO TABS: 5.0000 mg | ORAL_TABLET | Freq: Every evening | ORAL | Status: DC | PRN

## 2019-02-17 MED ORDER — ACETAMINOPHEN 325 MG PO TABS: 650.0000 mg | ORAL_TABLET | ORAL | Status: DC | PRN

## 2019-02-17 MED ORDER — SODIUM CHLORIDE 0.9 % IV SOLN: 250.0000 mL | INTRAVENOUS | Status: DC | PRN

## 2019-02-17 MED ORDER — FUROSEMIDE 10 MG/ML IJ SOLN
40.0000 mg | Freq: Once | INTRAMUSCULAR | Status: AC
  Administered 2019-02-17: 16:00:00 40 mg via INTRAVENOUS
  Filled 2019-02-17: qty 4

## 2019-02-17 MED ORDER — ALPRAZOLAM 0.25 MG PO TABS: 0.2500 mg | ORAL_TABLET | Freq: Two times a day (BID) | ORAL | Status: DC | PRN

## 2019-02-17 MED ORDER — LOSARTAN POTASSIUM 50 MG PO TABS
50.0000 mg | ORAL_TABLET | Freq: Every day | ORAL | Status: DC
  Administered 2019-02-18: 10:00:00 50 mg via ORAL
  Filled 2019-02-17: qty 1

## 2019-02-17 MED ORDER — FUROSEMIDE 10 MG/ML IJ SOLN
40.0000 mg | Freq: Once | INTRAMUSCULAR | Status: AC
  Administered 2019-02-17: 40 mg via INTRAVENOUS
  Filled 2019-02-17: qty 4

## 2019-02-17 MED ORDER — SODIUM CHLORIDE 0.9% FLUSH
3.0000 mL | Freq: Two times a day (BID) | INTRAVENOUS | Status: DC
  Administered 2019-02-17: 21:00:00 3 mL via INTRAVENOUS

## 2019-02-17 NOTE — ED Notes (Signed)
Patient transported to CT 

## 2019-02-17 NOTE — ED Provider Notes (Signed)
[name] EMERGENCY DEPARTMENT Provider Note   CSN: [id number] Arrival date & time: 02/17/19  1150     History Chief Complaint  Patient presents with  . Shortness of Breath    Mark Garcia is a 34 y.o. male.  Patient is a 34 year old male with no significant past medical history.  He presents today for evaluation of dyspnea.  Patient states he woke up this morning from sleep feeling short of breath and gasping for air.  He went to bed last night feeling well with no symptoms.  He denies any chest pain, fevers, or cough.  This sensation lasted for several minutes, then resolved.  Patient does tell me that he snores at night and has been told in the past he likely has sleep apnea.  He now feels better.  The history is provided by the patient.  Shortness of Breath Severity:  Moderate Onset quality:  Sudden Duration:  10 minutes Timing:  Constant Progression:  Resolved Chronicity:  New Relieved by:  Nothing Worsened by:  Nothing      Past Medical History:  Diagnosis Date  . Hypertension     There are no problems to display for this patient.   Past Surgical History:  Procedure Laterality Date  . FINGER AMPUTATION    . OTHER SURGICAL HISTORY     figner amputation       No family history on file.  Social History   Tobacco Use  . Smoking status: Former Research scientist (life sciences)  . Smokeless tobacco: Never Used  Substance Use Topics  . Alcohol use: No  . Drug use: No    Home Medications Prior to Admission medications   Medication Sig Start Date End Date Taking? Authorizing Provider  lisinopril (PRINIVIL,ZESTRIL) 5 MG tablet Take 1 tablet (5 mg total) by mouth daily. 07/03/13   Evelina Bucy, MD    Allergies    Patient has no known allergies.  Review of Systems   Review of Systems  Respiratory: Positive for shortness of breath.   All other systems reviewed and are negative.   Physical Exam Updated Vital Signs BP (!) 157/101 (BP Location: Right Arm)   Pulse  (!) 101   Temp 98.4 F (36.9 C) (Oral)   Resp 18   Ht 5\' 10"  (1.778 m)   Wt 113.4 kg   SpO2 97%   BMI 35.87 kg/m   Physical Exam Vitals and nursing note reviewed.  Constitutional:      General: He is not in acute distress.    Appearance: He is well-developed. He is not diaphoretic.  HENT:     Head: Normocephalic and atraumatic.  Cardiovascular:     Rate and Rhythm: Normal rate and regular rhythm.     Heart sounds: No murmur. No friction rub.  Pulmonary:     Effort: Pulmonary effort is normal. No respiratory distress.     Breath sounds: Normal breath sounds. No wheezing or rales.  Abdominal:     General: Bowel sounds are normal. There is no distension.     Palpations: Abdomen is soft.     Tenderness: There is no abdominal tenderness.  Musculoskeletal:        General: Normal range of motion.     Cervical back: Normal range of motion and neck supple.     Right lower leg: No tenderness. No edema.     Left lower leg: No tenderness. No edema.  Skin:    General: Skin is warm and dry.  Neurological:  Mental Status: He is alert and oriented to person, place, and time.     Coordination: Coordination normal.     ED Results / Procedures / Treatments   Labs (all labs ordered are listed, but only abnormal results are displayed) Labs Reviewed  BASIC METABOLIC PANEL  CBC WITH DIFFERENTIAL/PLATELET  BRAIN NATRIURETIC PEPTIDE  D-DIMER, QUANTITATIVE (NOT AT [facility])  TROPONIN I (HIGH SENSITIVITY)    EKG ED ECG REPORT   Date: 02/17/2019  Rate: 102  Rhythm: sinus tachycardia  QRS Axis: normal  Intervals: normal  ST/T Wave abnormalities: nonspecific T wave changes  Conduction Disutrbances:none  Narrative Interpretation:   Old EKG Reviewed: none available  I have personally reviewed the EKG tracing and agree with the computerized printout as noted.   Radiology No results found.  Procedures Procedures (including critical care time)  Medications Ordered in  ED Medications - No data to display  ED Course  I have reviewed the triage vital signs and the nursing notes.  Pertinent labs & imaging results that were available during my care of the patient were reviewed by me and considered in my medical decision making (see chart for details).    MDM Rules/Calculators/A&P  Patient presenting here with acute onset of shortness of breath upon waking from sleep this morning.  Patient is tachycardic with nonspecific EKG changes.  Laboratory studies reveal an elevated BNP and mildly elevated troponin.  A CT scan was obtained to rule out pulmonary embolism.  This study was negative for PE, but does show pulmonary edema and cardiomegaly.  It appears as though he is in new onset congestive heart failure.  I have discussed these findings with Dr. Estrella Myrtle from cardiology who feels as though the patient should be admitted to the hospital.  He was given IV Lasix here and [facility] be [facility].  CRITICAL CARE Performed by: Geoffery Lyons Total critical care time: 30 minutes Critical care time was exclusive of separately billable procedures and treating other patients. Critical care was necessary to treat or prevent imminent or life-threatening deterioration. Critical care was time spent personally by me on the following activities: development of treatment plan with patient and/or surrogate as well as nursing, discussions with consultants, evaluation of patient's response to treatment, examination of patient, obtaining history from patient or surrogate, ordering and performing treatments and interventions, ordering and review of laboratory studies, ordering and review of radiographic studies, pulse oximetry and re-evaluation of patient's condition.   Final Clinical Impression(s) / ED Diagnoses Final diagnoses:  None    Rx / DC Orders ED Discharge Orders    None       Geoffery Lyons, MD 02/17/19 1516

## 2019-02-17 NOTE — ED Notes (Signed)
Kept all valuables, pants, socks, shirt, jacket, cell phone, ear buds, wallet, shoes. Informed the hospital is not responsible for the loss of valuables kept by pt. Offered to lock with security, declines and all valuables will be kept on his person

## 2019-02-17 NOTE — ED Notes (Signed)
Report given to Chris RN with [facility]. 

## 2019-02-17 NOTE — ED Notes (Signed)
Patient transported to X-ray 

## 2019-02-17 NOTE — Progress Notes (Signed)
CCMD notified that patients HR dropped to 39 twice. Nonsustained. PT asleep. On call provider notified.

## 2019-02-17 NOTE — ED Triage Notes (Signed)
SOB this am around 0700, no cough, worse when trying to lay down, denies pain or SOB at present

## 2019-02-17 NOTE — H&P (Addendum)
Cardiology Admission History and Physical:   Patient ID: Mark Garcia; MRN: [id number]; DOB: 1986-01-24   Admission date: 02/17/2019  Primary Care Provider: Patient, No Pcp Per Primary Cardiologist: Peter Martinique, MD new Primary Electrophysiologist: None    Chief Complaint:  CHF  Patient Profile:   Mark Garcia is a 34 y.o. male with a history of HTN (not on rx), and ?OSA, obesity. He went to [facility] this am for SOB. Dx w/ CHF, and transferred to [facility] for admission.   History of Present Illness:   Mark Garcia girlfriend has checked his BP on occasion, very high, with SBP >220. He has not been on meds due to lack of insurance. He now has insurance through his job.   He has gained some weight in the last several months, has gone up sizes on clothes. However, no LE edema, no orthopnea. +PND, but almost certainly has sleep apnea.   Girlfriend notes that he is a heavy snorer and will stop breathing during the night, she has to wake him. Pt admits that he wakes in the night.   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 Denies DOE until this am.   He worked last pm and did well. This am, he felt SOB w/ any exertion. Could not do anything w/out feeling he could not get his breath. He did not have any chest pain. He knew something was not right>>went to [facility].  At [facility], he got IV Lasix 40 mg and has been urinating frequently since then. He is breathing better now.   Has never had anything like this before.    Past Medical History:  Diagnosis Date  . Hypertension     Past Surgical History:  Procedure Laterality Date  . FINGER AMPUTATION Left    tip of L index finger>>got caught in a machine     Medications Prior to Admission: Prior to Admission medications   Medication Sig Start Date End Date Taking? Authorizing Provider  lisinopril (PRINIVIL,ZESTRIL) 5 MG tablet Take 1 tablet (5 mg total) by mouth daily. 07/03/13   Evelina Bucy, MD     Allergies:    No Known Allergies  Social History:   Social History   Socioeconomic History  . Marital status: Single    Spouse name: Not on file  . Number of children: Not on file  . Years of education: Not on file  . Highest education level: Not on file  Occupational History  . Occupation: Animal nutritionist, 2nd shift    Employer: Conservation officer, historic buildings  Tobacco Use  . Smoking status: Former Smoker    Quit date: 02/04/2012    Years since quitting: 7.0  . Smokeless tobacco: Never Used  Substance and Sexual Activity  . Alcohol use: No  . Drug use: No  . Sexual activity: Not on file  Other Topics Concern  . Not on file  Social History Narrative   Pt lives with girlfriend.   Social Determinants of Health   Financial Resource Strain:   . Difficulty of Paying Living Expenses: Not on file  Food Insecurity:   . Worried About Charity fundraiser in the Last Year: Not on file  . Ran Out of Food in the Last Year: Not on file  Transportation Needs:   . Lack of Transportation (Medical): Not on file  . Lack of Transportation (Non-Medical): Not on file  Physical Activity:   . Days of Exercise per Week: Not  on file  . Minutes of Exercise per Session: Not on file  Stress:   . Feeling of Stress : Not on file  Social Connections:   . Frequency of Communication with Friends and Family: Not on file  . Frequency of Social Gatherings with Friends and Family: Not on file  . Attends Religious Services: Not on file  . Active Member of Clubs or Organizations: Not on file  . Attends Banker Meetings: Not on file  . Marital Status: Not on file  Intimate Partner Violence:   . Fear of Current or Ex-Partner: Not on file  . Emotionally Abused: Not on file  . Physically Abused: Not on file  . Sexually Abused: Not on file    Family History:  The patient's family history includes Arthritis in his mother; Diabetes in his maternal grandmother; Scleroderma in his mother.   The patient He indicated that his  mother is alive. He indicated that his father is alive. He indicated that his maternal grandmother is alive.   ROS:  Please see the history of present illness.  All other ROS reviewed and negative.     Physical Exam/Data:   Vitals:   02/17/19 1545 02/17/19 1600 02/17/19 1700 02/17/19 1737  BP: (!) 153/108 (!) 147/128  (!) 163/114  Pulse: 98 99  95  Resp: (!) 24   20  Temp:    98.7 F (37.1 C)  TempSrc:    Oral  SpO2: 97%   98%  Weight:   (!) 140.6 kg   Height:   5\' 10"  (1.778 m)     Intake/Output Summary (Last 24 hours) at 02/17/2019 1813 Last data filed at 02/17/2019 1635 Gross per 24 hour  Intake --  Output 800 ml  Net -800 ml   Filed Weights   02/17/19 1206 02/17/19 1700  Weight: 113.4 kg (!) 140.6 kg   Body mass index is 44.48 kg/m.  General:  Well nourished, well developed, male in no acute distress HEENT: normal Lymph: no adenopathy Neck:  JVD approx 9 cm, +HJR Endocrine:  No thryomegaly Vascular: No carotid bruits; 4/4 extremity pulses 2+ bilaterally  Cardiac:  normal S1, S2; RRR; no murmur, no rub or gallop  Lungs: decreased BS bases bilaterally, no wheezing, rhonchi, some rales  Abd: soft, nontender, no hepatomegaly  Ext: trace pedal edema Musculoskeletal:  No deformities, BUE and BLE strength normal and equal Skin: warm and dry  Neuro:  CNs 2-12 intact, no focal abnormalities noted Psych:  Normal affect    EKG:  The ECG was personally reviewed: ST, HR 102, LVH w/ lat T wave inversions, biatrial enlargement, no old in system Telemetry: SR, ST  Relevant CV Studies:  None  Laboratory Data:  Chemistry Recent Labs  Lab 02/17/19 1232  NA 139  K 4.5  CL 102  CO2 30  GLUCOSE 106*  BUN 12  CREATININE 0.91  CALCIUM 9.5  GFRNONAA >60  GFRAA >60  ANIONGAP 7    No results for input(s): PROT, ALBUMIN, AST, ALT, ALKPHOS, BILITOT in the last 168 hours. Hematology Recent Labs  Lab 02/17/19 1232  WBC 6.6  RBC 5.15  HGB 14.4  HCT 45.2  MCV  87.8  MCH 28.0  MCHC 31.9  RDW 12.4  PLT 213   Cardiac Enzymes  High Sensitivity Troponin:   Recent Labs  Lab 02/17/19 1232 02/17/19 1455  TROPONINIHS 120* 98*     BNP Recent Labs  Lab 02/17/19 1232  BNP 574.8*  DDimer  Recent Labs  Lab 02/17/19 1232  DDIMER 1.17*   Lipids: No results found for: CHOL, HDL, LDLCALC, LDLDIRECT, TRIG, CHOLHDL INR: No results found for: INR, PROTIME A1c: No results found for: HGBA1C Thyroid: No results found for: TSH, T3TOTAL, T4TOTAL, THYROIDAB  Radiology/Studies:  DG Chest 2 View  Result Date: 02/17/2019 CLINICAL DATA:  Shortness of breath EXAM: CHEST - 2 VIEW COMPARISON:  None. FINDINGS: Cardiomegaly. Mild, diffuse interstitial pulmonary opacity. The visualized skeletal structures are unremarkable. IMPRESSION: 1. Cardiomegaly, which may reflect enlargement of the heart or pericardial effusion. 2. Mild, diffuse interstitial pulmonary opacity, likely edema. No focal airspace opacity. Electronically Signed   By: Lauralyn Primes M.D.   On: 02/17/2019 13:05   CT Angio Chest PE W and/or Wo Contrast  Result Date: 02/17/2019 CLINICAL DATA:  Shortness of breath EXAM: CT ANGIOGRAPHY CHEST WITH CONTRAST TECHNIQUE: Multidetector CT imaging of the chest was performed using the standard protocol during bolus administration of intravenous contrast. Multiplanar CT image reconstructions and MIPs were obtained to evaluate the vascular anatomy. CONTRAST:  OMNIPAQUE IOHEXOL 350 MG/ML SOLN COMPARISON:  Chest radiograph, 02/17/2019 FINDINGS: Cardiovascular: Satisfactory opacification of the pulmonary arteries to the segmental level. No evidence of pulmonary embolism. Cardiomegaly. No pericardial effusion. Mediastinum/Nodes: No enlarged mediastinal, hilar, or axillary lymph nodes. Thyroid gland, trachea, and esophagus demonstrate no significant findings. Lungs/Pleura: Interlobular septal thickening. Small bilateral pulmonary nodules, measuring up to 5 mm in the  left lower lobe (series 8, image 76). No pleural effusion or pneumothorax. Upper Abdomen: No acute abnormality. Musculoskeletal: No chest wall abnormality. No acute or significant osseous findings. Review of the MIP images confirms the above findings. IMPRESSION: 1. Negative examination for pulmonary embolism. 2. Cardiomegaly with interstitial pulmonary edema. 3. Small bilateral pulmonary nodules, measuring up to 5 mm in the left lower lobe. No follow-up needed if patient is low-risk (and has no known or suspected primary neoplasm). Non-contrast chest CT can be considered in 12 months if patient is high-risk. This recommendation follows the consensus statement: Guidelines for Management of Incidental Pulmonary Nodules Detected on CT Images: From the Fleischner Society 2017; Radiology 2017; 284:228-243. Electronically Signed   By: Lauralyn Primes M.D.   On: 02/17/2019 14:55    Assessment and Plan:   1. Acute CHF - new dx, type unclear but suspect combined >>echo - continue diuresis, he had Lasix 40 mg IV x 1 this pm, give another dose tonight - daily wts, strict I/O - BP is elevated, add BB and ARB - mild trop elevation more c/w CHF than ACS. - f/u in am to see response to therapy. - since EF likely decreased, will ck lipids, TSH and A1c  Active Problems:   Congestive heart failure (CHF) (HCC)   For questions or updates, please contact [facility] Please consult [email or web address] for contact info under Cardiology/STEMI.    Melida Quitter, PA-C  02/17/2019 6:13 PM

## 2019-02-17 NOTE — ED Notes (Signed)
Report given to receiving nurse, Eun RN at [facility]

## 2019-02-18 ENCOUNTER — Other Ambulatory Visit: Payer: Self-pay | Admitting: Physician Assistant

## 2019-02-18 ENCOUNTER — Observation Stay (HOSPITAL_BASED_OUTPATIENT_CLINIC_OR_DEPARTMENT_OTHER): Payer: Self-pay

## 2019-02-18 DIAGNOSIS — I5021 Acute systolic (congestive) heart failure: Secondary | ICD-10-CM

## 2019-02-18 DIAGNOSIS — Z79899 Other long term (current) drug therapy: Secondary | ICD-10-CM

## 2019-02-18 DIAGNOSIS — I340 Nonrheumatic mitral (valve) insufficiency: Secondary | ICD-10-CM

## 2019-02-18 DIAGNOSIS — I5041 Acute combined systolic (congestive) and diastolic (congestive) heart failure: Secondary | ICD-10-CM

## 2019-02-18 LAB — ECHOCARDIOGRAM COMPLETE
Height: 70 in
Weight: 5017.6 oz

## 2019-02-18 LAB — HEPATIC FUNCTION PANEL
ALT: 34 U/L (ref 0–44)
AST: 25 U/L (ref 15–41)
Albumin: 3.7 g/dL (ref 3.5–5.0)
Alkaline Phosphatase: 42 U/L (ref 38–126)
Bilirubin, Direct: 0.1 mg/dL (ref 0.0–0.2)
Indirect Bilirubin: 0.8 mg/dL (ref 0.3–0.9)
Total Bilirubin: 0.9 mg/dL (ref 0.3–1.2)
Total Protein: 7.1 g/dL (ref 6.5–8.1)

## 2019-02-18 LAB — LIPID PANEL
Cholesterol: 144 mg/dL (ref 0–200)
HDL: 42 mg/dL (ref >40)
LDL Cholesterol: 89 mg/dL (ref 0–99)
Total CHOL/HDL Ratio: 3.4 RATIO
Triglycerides: 65 mg/dL (ref <150)
VLDL: 13 mg/dL (ref 0–40)

## 2019-02-18 LAB — BASIC METABOLIC PANEL
Anion gap: 9 (ref 5–15)
BUN: 12 mg/dL (ref 6–20)
CO2: 29 mmol/L (ref 22–32)
Calcium: 9.2 mg/dL (ref 8.9–10.3)
Chloride: 100 mmol/L (ref 98–111)
Creatinine, Ser: 1.11 mg/dL (ref 0.61–1.24)
GFR calc Af Amer: >60 mL/min (ref >60)
GFR calc non Af Amer: >60 mL/min (ref >60)
Glucose, Bld: 110 mg/dL — ABNORMAL HIGH (ref 70–99)
Potassium: 3.8 mmol/L (ref 3.5–5.1)
Sodium: 138 mmol/L (ref 135–145)

## 2019-02-18 LAB — TSH: TSH: 2.559 u[IU]/mL (ref 0.350–4.500)

## 2019-02-18 LAB — HEMOGLOBIN A1C
Hgb A1c MFr Bld: 6 % — ABNORMAL HIGH (ref 4.8–5.6)
Mean Plasma Glucose: 125.5 mg/dL

## 2019-02-18 MED ORDER — SACUBITRIL-VALSARTAN 24-26 MG PO TABS: 1.0000 | ORAL_TABLET | Freq: Two times a day (BID) | ORAL | Status: DC

## 2019-02-18 MED ORDER — CARVEDILOL 12.5 MG PO TABS: 12.5000 mg | ORAL_TABLET | Freq: Two times a day (BID) | ORAL | 6 refills | Status: DC

## 2019-02-18 MED ORDER — SPIRONOLACTONE 12.5 MG HALF TABLET: 12.5000 mg | ORAL_TABLET | Freq: Every day | ORAL | Status: DC

## 2019-02-18 MED ORDER — SPIRONOLACTONE 25 MG PO TABS: 12.5000 mg | ORAL_TABLET | Freq: Every day | ORAL | 3 refills | Status: DC

## 2019-02-18 MED ORDER — SACUBITRIL-VALSARTAN 24-26 MG PO TABS: 1.0000 | ORAL_TABLET | Freq: Two times a day (BID) | ORAL | 3 refills | Status: DC

## 2019-02-18 MED FILL — ENTRESTO 24 MG-26 MG TABLET: 24-26 | 30 days supply | Qty: 60 | Fill #0

## 2019-02-18 MED FILL — CARVEDILOL 12.5 MG TABLET: 12.5 | 30 days supply | Qty: 60 | Fill #0

## 2019-02-18 MED FILL — SPIRONOLACTONE 25 MG TABLET: 25 | 30 days supply | Qty: 15 | Fill #0

## 2019-02-18 NOTE — TOC Progression Note (Addendum)
Transition of Care [facility]) - Progression Note    Patient Details  Name: Mark Garcia MRN: [id number] Date of Birth: 04-25-1985  [facility]) CM/SW Contact  Leone Haven, RN Phone Number: 02/18/2019, 2:47 PM  Clinical Narrative:    Patient is for dc today, awaiting beneift check for entresto, NCM gave patient the 10.00 copay card.his cell is [phone].  TOC is filling the 30 day free for patient.  Patient states he does not have a insurance card yet but he has insurance with aetna ID [id number], phon [phone].        Expected Discharge Plan and Services           Expected Discharge Date: 02/18/19                                     Social Determinants of Health (SDOH) Interventions    Readmission Risk Interventions No flowsheet data found.

## 2019-02-18 NOTE — Progress Notes (Addendum)
Progress Note  Patient Name: Mark Garcia Date of Encounter: 02/18/2019  Primary Cardiologist: Emira Eubanks [place], MD   Subjective   Feels well this am. Good response to diuretic last night. No dyspnea or chest pain.  Inpatient Medications    Scheduled Meds: . carvedilol  12.5 mg Oral BID WC  . enoxaparin (LOVENOX) injection  70 mg Subcutaneous Q24H  . losartan  50 mg Oral Daily  . sodium chloride flush  3 mL Intravenous Q12H   Continuous Infusions: . sodium chloride     PRN Meds: sodium chloride, acetaminophen, ALPRAZolam, nitroGLYCERIN, ondansetron (ZOFRAN) IV, sodium chloride flush, zolpidem   Vital Signs    Vitals:   02/17/19 2025 02/18/19 0005 02/18/19 0334 02/18/19 0752  BP: (!) 143/81 129/86 108/85 (!) 138/99  Pulse: 92 86 83 85  Resp: 20 18 18 19   Temp: 98 F (36.7 C) 97.9 F (36.6 C) 97.7 F (36.5 C) 98.2 F (36.8 C)  TempSrc: Oral Oral Oral Oral  SpO2: 97% 93% 97% 98%  Weight:   (!) 142.2 kg   Height:        Intake/Output Summary (Last 24 hours) at 02/18/2019 0844 Last data filed at 02/18/2019 0757 Gross per 24 hour  Intake 840 ml  Output 2350 ml  Net -1510 ml   Last 3 Weights 02/18/2019 02/17/2019 02/17/2019  Weight (lbs) 313 lb 9.6 oz 309 lb 15.5 oz 250 lb  Weight (kg) 142.248 kg 140.6 kg 113.399 kg      Telemetry    NSR. Some bradycardia during sleep- Personally Reviewed  ECG    None today - Personally Reviewed  Physical Exam   GEN: No acute distress.  obese Neck: No JVD Cardiac: RRR, no murmurs, rubs, or gallops.  Respiratory: Clear to auscultation bilaterally. GI: Soft, nontender, non-distended  MS: No edema; No deformity. Neuro:  Nonfocal  Psych: Normal affect   Labs    High Sensitivity Troponin:   Recent Labs  Lab 02/17/19 1232 02/17/19 1455  TROPONINIHS 120* 98*      Chemistry Recent Labs  Lab 02/17/19 1232 02/18/19 0407  NA 139 138  K 4.5 3.8  CL 102 100  CO2 30 29  GLUCOSE 106* 110*  BUN 12 12  CREATININE 0.91  1.11  CALCIUM 9.5 9.2  PROT  --  7.1  ALBUMIN  --  3.7  AST  --  25  ALT  --  34  ALKPHOS  --  42  BILITOT  --  0.9  GFRNONAA >60 >60  GFRAA >60 >60  ANIONGAP 7 9     Hematology Recent Labs  Lab 02/17/19 1232  WBC 6.6  RBC 5.15  HGB 14.4  HCT 45.2  MCV 87.8  MCH 28.0  MCHC 31.9  RDW 12.4  PLT 213    BNP Recent Labs  Lab 02/17/19 1232  BNP 574.8*     DDimer  Recent Labs  Lab 02/17/19 1232  DDIMER 1.17*     Radiology    DG Chest 2 View  Result Date: 02/17/2019 CLINICAL DATA:  Shortness of breath EXAM: CHEST - 2 VIEW COMPARISON:  None. FINDINGS: Cardiomegaly. Mild, diffuse interstitial pulmonary opacity. The visualized skeletal structures are unremarkable. IMPRESSION: 1. Cardiomegaly, which may reflect enlargement of the heart or pericardial effusion. 2. Mild, diffuse interstitial pulmonary opacity, likely edema. No focal airspace opacity. Electronically Signed   By: 02/19/2019 M.D.   On: 02/17/2019 13:05   CT Angio Chest PE W and/or Wo Contrast  Result  Date: 02/17/2019 CLINICAL DATA:  Shortness of breath EXAM: CT ANGIOGRAPHY CHEST WITH CONTRAST TECHNIQUE: Multidetector CT imaging of the chest was performed using the standard protocol during bolus administration of intravenous contrast. Multiplanar CT image reconstructions and MIPs were obtained to evaluate the vascular anatomy. CONTRAST:  174mL OMNIPAQUE IOHEXOL 350 MG/ML SOLN COMPARISON:  Chest radiograph, 02/17/2019 FINDINGS: Cardiovascular: Satisfactory opacification of the pulmonary arteries to the segmental level. No evidence of pulmonary embolism. Cardiomegaly. No pericardial effusion. Mediastinum/Nodes: No enlarged mediastinal, hilar, or axillary lymph nodes. Thyroid gland, trachea, and esophagus demonstrate no significant findings. Lungs/Pleura: Interlobular septal thickening. Small bilateral pulmonary nodules, measuring up to 5 mm in the left lower lobe (series 8, image 76). No pleural effusion or  pneumothorax. Upper Abdomen: No acute abnormality. Musculoskeletal: No chest wall abnormality. No acute or significant osseous findings. Review of the MIP images confirms the above findings. IMPRESSION: 1. Negative examination for pulmonary embolism. 2. Cardiomegaly with interstitial pulmonary edema. 3. Small bilateral pulmonary nodules, measuring up to 5 mm in the left lower lobe. No follow-up needed if patient is low-risk (and has no known or suspected primary neoplasm). Non-contrast chest CT can be considered in 12 months if patient is high-risk. This recommendation follows the consensus statement: Guidelines for Management of Incidental Pulmonary Nodules Detected on CT Images: From the Fleischner Society 2017; Radiology 2017; 284:228-243. Electronically Signed   By: Eddie Candle M.D.   On: 02/17/2019 14:55    Cardiac Studies   Echo pending  Patient Profile     34 y.o. male with longstanding uncontrolled and untreated HTN presents with new onset CHF  Assessment & Plan    1. Hypertensive heart disease with new onset CHF. Echo pending to assess EF. BP much better this am after initiation of Coreg and losartan. Good diuresis with IV lasix.  - BNP 574 - CXR and CT show CM with mild pulmonary edema.  - will check Echo. This will help guide medical therapy and need for further diuresis -continue losartan and Coreg for now.  - sodium restriction - anticipate DC today post Echo 2. Obesity with likely OSA by history. Will need outpatient sleep study.  For questions or updates, please contact [place] Please consult [email or web address] for contact info under        Signed, Hydia Copelin Martinique, MD  02/18/2019, 8:44 AM    Addendum: I reviewed Echo today. His LV is enlarged with moderate LVH. There is severe global HK with EF 25-30%. No significant valvular abnormality.   Findings are c/w hypertensive heart disease.  Recommend switching losartan to Entresto 24/26 mg bid. Add spironolactone 12.5 mg  daily. OK for DC home today. Will need BMET early next week. Will need close follow up with further titration of CHF medication as outpatient.    Breckyn Ticas Martinique MD, [facility]

## 2019-02-18 NOTE — Plan of Care (Signed)

## 2019-02-18 NOTE — Discharge Summary (Signed)
Discharge Summary    Patient ID: Mark Garcia MRN: [id number]; DOB: [birth year]  Admit date: 02/17/2019 Discharge date: 02/18/2019  Primary Care Provider: Patient, No Pcp Per  Primary Cardiologist: Peter Swaziland, MD   Discharge Diagnoses    Principal Problem:   Acute systolic CHF (congestive heart failure) (HCC) Active Problems:   Congestive heart failure (CHF) (HCC)   Acute CHF (congestive heart failure) (HCC)   Hypertensive heart disease with heart failure (HCC)   Morbid obesity (HCC)     Diagnostic Studies/Procedures    Echo 02/18/19 1. Left ventricular ejection fraction, by visual estimation, is 25 to 30%. The left ventricle has severely decreased function. There is moderately increased left ventricular hypertrophy.  2. The left ventricle demonstrates global hypokinesis.  3. Severely dilated left ventricular internal cavity size.  4. Left ventricular diastolic parameters are consistent with Grade II diastolic dysfunction (pseudonormalization).  5. Elevated left atrial pressure.  6. Global right ventricle has normal systolic function.The right ventricular size is normal.  7. Left atrial size was mildly dilated.  8. Right atrial size was normal.  9. The mitral valve is normal in structure. Mild mitral valve regurgitation. 10. The tricuspid valve is normal in structure. 11. The aortic valve is normal in structure. Aortic valve regurgitation is not visualized. 12. The pulmonic valve was not well visualized. Pulmonic valve regurgitation is not visualized. 13. The inferior vena cava is normal in size with greater than 50% respiratory variability, suggesting right atrial pressure of 3 mmHg. 14. TR signal is inadequate for assessing pulmonary artery systolic pressure.   History of Present Illness     Mark Garcia is a 34 y.o. male with a history of HTN (not on rx), ?OSA and obesity admitted for CHF.   He went to [facility] for SOB. Mr. Decicco girlfriend has checked his BP on occasion,  very high, with SBP >220. He has not been on meds due to lack of insurance. He now has insurance through his job. He has gained some weight in the last several months, has gone up sizes on clothes. However, no LE edema, no orthopnea. +PND, but almost certainly has sleep apnea. Girlfriend notes that he is a heavy snorer and will stop breathing during the night, she has to wake him.   He presented to [facility] for evaluation of SOB with exertion. No chest pain. Breathing improved with IV lasix. Transferred to [facility].   Hospital Course     Consultants: None  He was admitted overnight for IV diuresis. BNP 574. CXR with interstitial pulmonary edema and cardiomegaly. He diuresed 2L. Started on Coreg and Losartan. However echo showed severely reduce LV function at 25-30%, moderate LVH, global hypokinesis. Elevated LA pressure. No significant valvular abnormality. Felt that his findings are consistent with hypertensive heart disease. His Losartan switched to Entresto 24/26mg  BID and added spironolactone 12.5mg  bid. Outpatient titration of heart failure medications. Consider outpatient evaluation of sleep study.   Did the patient have an acute coronary syndrome (MI, NSTEMI, STEMI, etc) this admission?:  No                               Did the patient have a percutaneous coronary intervention (stent / angioplasty)?:  No.   _____________  Discharge Vitals Blood pressure (!) 128/92, pulse 84, temperature 98.6 F (37 C), temperature source Oral, resp. rate 20, height 5\' 10"  (1.778 m), weight (!) 142.2 kg, SpO2 95 %.  Filed Weights  02/17/19 1206 02/17/19 1700 02/18/19 0334  Weight: 113.4 kg (!) 140.6 kg (!) 142.2 kg    Labs & Radiologic Studies    CBC Recent Labs    02/17/19 1232  WBC 6.6  NEUTROABS 3.7  HGB 14.4  HCT 45.2  MCV 87.8  PLT 213   Basic Metabolic Panel Recent Labs    86/57/8401/14/21 1232 02/18/19 0407  NA 139 138  K 4.5 3.8  CL 102 100  CO2 30 29  GLUCOSE 106* 110*  BUN 12 12    CREATININE 0.91 1.11  CALCIUM 9.5 9.2   Liver Function Tests Recent Labs    02/18/19 0407  AST 25  ALT 34  ALKPHOS 42  BILITOT 0.9  PROT 7.1  ALBUMIN 3.7   No results for input(s): LIPASE, AMYLASE in the last 72 hours. High Sensitivity Troponin:   Recent Labs  Lab 02/17/19 1232 02/17/19 1455  TROPONINIHS 120* 98*    D-Dimer Recent Labs    02/17/19 1232  DDIMER 1.17*   Hemoglobin A1C Recent Labs    02/18/19 0407  HGBA1C 6.0*   Fasting Lipid Panel Recent Labs    02/18/19 0407  CHOL 144  HDL 42  LDLCALC 89  TRIG 65  CHOLHDL 3.4   Thyroid Function Tests Recent Labs    02/18/19 0407  TSH 2.559   _____________  DG Chest 2 View  Result Date: 02/17/2019 CLINICAL DATA:  Shortness of breath EXAM: CHEST - 2 VIEW COMPARISON:  None. FINDINGS: Cardiomegaly. Mild, diffuse interstitial pulmonary opacity. The visualized skeletal structures are unremarkable. IMPRESSION: 1. Cardiomegaly, which may reflect enlargement of the heart or pericardial effusion. 2. Mild, diffuse interstitial pulmonary opacity, likely edema. No focal airspace opacity. Electronically Signed   By: Lauralyn PrimesAlex  Bibbey M.D.   On: 02/17/2019 13:05   CT Angio Chest PE W and/or Wo Contrast  Result Date: 02/17/2019 CLINICAL DATA:  Shortness of breath EXAM: CT ANGIOGRAPHY CHEST WITH CONTRAST TECHNIQUE: Multidetector CT imaging of the chest was performed using the standard protocol during bolus administration of intravenous contrast. Multiplanar CT image reconstructions and MIPs were obtained to evaluate the vascular anatomy. CONTRAST:  100mL OMNIPAQUE IOHEXOL 350 MG/ML SOLN COMPARISON:  Chest radiograph, 02/17/2019 FINDINGS: Cardiovascular: Satisfactory opacification of the pulmonary arteries to the segmental level. No evidence of pulmonary embolism. Cardiomegaly. No pericardial effusion. Mediastinum/Nodes: No enlarged mediastinal, hilar, or axillary lymph nodes. Thyroid gland, trachea, and esophagus demonstrate no  significant findings. Lungs/Pleura: Interlobular septal thickening. Small bilateral pulmonary nodules, measuring up to 5 mm in the left lower lobe (series 8, image 76). No pleural effusion or pneumothorax. Upper Abdomen: No acute abnormality. Musculoskeletal: No chest wall abnormality. No acute or significant osseous findings. Review of the MIP images confirms the above findings. IMPRESSION: 1. Negative examination for pulmonary embolism. 2. Cardiomegaly with interstitial pulmonary edema. 3. Small bilateral pulmonary nodules, measuring up to 5 mm in the left lower lobe. No follow-up needed if patient is low-risk (and has no known or suspected primary neoplasm). Non-contrast chest CT can be considered in 12 months if patient is high-risk. This recommendation follows the consensus statement: Guidelines for Management of Incidental Pulmonary Nodules Detected on CT Images: From the Fleischner Society 2017; Radiology 2017; 284:228-243. Electronically Signed   By: Lauralyn PrimesAlex  Bibbey M.D.   On: 02/17/2019 14:55   ECHOCARDIOGRAM COMPLETE  Result Date: 02/18/2019   ECHOCARDIOGRAM REPORT   Patient Name:   Colbert CoyerARON Vasallo Date of Exam: 02/18/2019 Medical Rec #:  [id number]   Height:  70.0 in Accession #:    [id number]  Weight:       313.6 lb Date of Birth:  [birth year]   BSA:          2.53 m Patient Age:    33 years    BP:           138/99 mmHg Patient Gender: M           HR:           89 bpm. Exam Location:  Inpatient Procedure: 2D Echo, Cardiac Doppler and Color Doppler Indications:    I50.23 Acute on chronic systolic (congestive) heart failure  History:        Patient has no prior history of Echocardiogram examinations.                 Risk Factors:Hypertension.  Sonographer:    Elmarie Shiley Dance Referring Phys: [id number] RHONDA G BARRETT IMPRESSIONS  1. Left ventricular ejection fraction, by visual estimation, is 25 to 30%. The left ventricle has severely decreased function. There is moderately increased left ventricular hypertrophy.   2. The left ventricle demonstrates global hypokinesis.  3. Severely dilated left ventricular internal cavity size.  4. Left ventricular diastolic parameters are consistent with Grade II diastolic dysfunction (pseudonormalization).  5. Elevated left atrial pressure.  6. Global right ventricle has normal systolic function.The right ventricular size is normal.  7. Left atrial size was mildly dilated.  8. Right atrial size was normal.  9. The mitral valve is normal in structure. Mild mitral valve regurgitation. 10. The tricuspid valve is normal in structure. 11. The aortic valve is normal in structure. Aortic valve regurgitation is not visualized. 12. The pulmonic valve was not well visualized. Pulmonic valve regurgitation is not visualized. 13. The inferior vena cava is normal in size with greater than 50% respiratory variability, suggesting right atrial pressure of 3 mmHg. 14. TR signal is inadequate for assessing pulmonary artery systolic pressure. FINDINGS  Left Ventricle: Left ventricular ejection fraction, by visual estimation, is 25 to 30%. The left ventricle has severely decreased function. The left ventricle demonstrates global hypokinesis. The left ventricular internal cavity size was severely dilated left ventricle. There is moderately increased left ventricular hypertrophy. Left ventricular diastolic parameters are consistent with Grade II diastolic dysfunction (pseudonormalization). Elevated left atrial pressure. Right Ventricle: The right ventricular size is normal. No increase in right ventricular wall thickness. Global RV systolic function is has normal systolic function. Left Atrium: Left atrial size was mildly dilated. Right Atrium: Right atrial size was normal in size Pericardium: There is no evidence of pericardial effusion. Mitral Valve: The mitral valve is normal in structure. Mild mitral valve regurgitation. Tricuspid Valve: The tricuspid valve is normal in structure. Tricuspid valve regurgitation  is trivial. Aortic Valve: The aortic valve is normal in structure. Aortic valve regurgitation is not visualized. Pulmonic Valve: The pulmonic valve was not well visualized. Pulmonic valve regurgitation is not visualized. Pulmonic regurgitation is not visualized. Aorta: The aortic root and ascending aorta are structurally normal, with no evidence of dilitation. Venous: The inferior vena cava is normal in size with greater than 50% respiratory variability, suggesting right atrial pressure of 3 mmHg. IAS/Shunts: The interatrial septum was not well visualized.  LEFT VENTRICLE PLAX 2D LVIDd:         7.00 cm  Diastology LVIDs:         5.90 cm  LV e' lateral:   5.34 cm/s LV PW:  1.60 cm  LV E/e' lateral: 16.4 LV IVS:        1.00 cm  LV e' medial:    5.52 cm/s LVOT diam:     2.30 cm  LV E/e' medial:  15.8 LV SV:         82 ml LV SV Index:   30.21 LVOT Area:     4.15 cm  RIGHT VENTRICLE             IVC RV Basal diam:  3.10 cm     IVC diam: 2.00 cm RV Mid diam:    2.40 cm RV S prime:     14.10 cm/s TAPSE (M-mode): 1.9 cm LEFT ATRIUM              Index       RIGHT ATRIUM           Index LA diam:        5.50 cm  2.18 cm/m  RA Area:     19.20 cm LA Vol (A2C):   131.0 ml 51.84 ml/m RA Volume:   53.50 ml  21.17 ml/m LA Vol (A4C):   72.3 ml  28.61 ml/m LA Biplane Vol: 96.0 ml  37.99 ml/m  AORTIC VALVE LVOT Vmax:   73.80 cm/s LVOT Vmean:  44.600 cm/s LVOT VTI:    0.114 m  AORTA Ao Root diam: 3.30 cm Ao Asc diam:  3.10 cm MITRAL VALVE MV Area (PHT): 5.38 cm             SHUNTS MV PHT:        40.89 msec           Systemic VTI:  0.11 m MV Decel Time: 141 msec             Systemic Diam: 2.30 cm MV E velocity: 87.40 cm/s 103 cm/s MV A velocity: 58.50 cm/s 70.3 cm/s MV E/A ratio:  1.49       1.5  Oswaldo Milian MD Electronically signed by Oswaldo Milian MD Signature Date/Time: 02/18/2019/1:23:01 PM    Final    Disposition   Pt is being discharged home today in good condition.  Follow-up Plans & Appointments      Follow-up Information    Deberah Pelton, NP. Go on 03/07/2019.   Specialty: Cardiology Why: @1 :30pm for hospital follow up with Dr. Doug Sou PA/NP Contact information: [address] [place] [address] [place]. Go on 02/22/2019.   Specialty: Cardiology Why: between 7:30am to 4:30pm for BMET (kidney function and electrolyte check) Contact information: [address] [place] [phone]         Discharge Instructions    Diet - low sodium heart healthy   Complete by: As directed    Discharge instructions   Complete by: As directed    Weigh yourself EVERY morning after you go to the bathroom but before you eat or drink anything. Write this number down in a weight log/diary. If you gain 3 pounds overnight or 5 pounds in a week, call the office. Take your medicines as prescribed. If you have concerns about your medications, please call us before you stop taking them.  Eat low salt foods--Limit salt (sodium) to 2000 mg per day. This will help prevent your body from holding onto fluid. Read food labels as many processed foods have a lot of sodium, especially canned goods and prepackaged meats. If  you would like some assistance choosing low sodium foods, we would be happy to set you up with a nutritionist. Stay as active as you can everyday. Staying active will give you more energy and make your muscles stronger. Start with 5 minutes at a time and work your way up to 30 minutes a day. Break up your activities--do some in the morning and some in the afternoon. Start with 3 days per week and work your way up to 5 days as you can. If you have chest pain, feel short of breath, dizzy, or lightheaded, STOP. If you don't feel better after a short rest, call 911. If you do feel better, call the office to let us know you have symptoms with exercise. Limit all fluids for the day to less than 2 liters. Fluid  includes all drinks, coffee, juice, ice chips, soup, jello, and all other liquids.   Increase activity slowly   Complete by: As directed       Discharge Medications   Allergies as of 02/18/2019   No Known Allergies     Medication List    STOP taking these medications   guaiFENesin 600 MG 12 hr tablet Commonly known as: MUCINEX   lisinopril 5 MG tablet Commonly known as: ZESTRIL     TAKE these medications   carvedilol 12.5 MG tablet Commonly known as: COREG Take 1 tablet (12.5 mg total) by mouth 2 (two) times daily with a meal.   sacubitril-valsartan 24-26 MG Commonly known as: ENTRESTO Take 1 tablet by mouth 2 (two) times daily. Start taking on: February 19, 2019   spironolactone 25 MG tablet Commonly known as: ALDACTONE Take 0.5 tablets (12.5 mg total) by mouth daily. Start taking on: February 19, 2019          Outstanding Labs/Studies   BMET early next week   Duration of Discharge Encounter   Greater than 30 minutes including physician time.  Lorelei Pont, PA 02/18/2019, 2:06 PM

## 2019-02-18 NOTE — Progress Notes (Signed)
  Echocardiogram 2D Echocardiogram has been performed.  Mark Garcia 02/18/2019, 9:41 AM

## 2019-02-22 ENCOUNTER — Other Ambulatory Visit: Payer: Self-pay

## 2019-02-22 DIAGNOSIS — Z79899 Other long term (current) drug therapy: Secondary | ICD-10-CM

## 2019-02-22 DIAGNOSIS — I5021 Acute systolic (congestive) heart failure: Secondary | ICD-10-CM

## 2019-02-22 LAB — BASIC METABOLIC PANEL
BUN/Creatinine Ratio: 13 (ref 9–20)
BUN: 13 mg/dL (ref 6–20)
CO2: 23 mmol/L (ref 20–29)
Calcium: 9.9 mg/dL (ref 8.7–10.2)
Chloride: 100 mmol/L (ref 96–106)
Creatinine, Ser: 0.98 mg/dL (ref 0.76–1.27)
GFR calc Af Amer: 115 mL/min/{1.73_m2} (ref >59)
GFR calc non Af Amer: 99 mL/min/{1.73_m2} (ref >59)
Glucose: 89 mg/dL (ref 65–99)
Potassium: 4.7 mmol/L (ref 3.5–5.2)
Sodium: 138 mmol/L (ref 134–144)

## 2019-03-03 NOTE — Progress Notes (Signed)
Cardiology Clinic Note   Patient Name: Mark Garcia Date of Encounter: 03/07/2019  Primary Care Provider:  Patient, No Pcp Per Primary Cardiologist:  Peter Swaziland, MD  Patient Profile    Mark Garcia 34 year old male presents today for follow-up of his congestive heart failure, hypertensive heart disease, and morbid obesity.  Past Medical History    Past Medical History:  Diagnosis Date  . Hypertension    Past Surgical History:  Procedure Laterality Date  . FINGER AMPUTATION Left    tip of L index finger>>got caught in a machine    Allergies  No Known Allergies  History of Present Illness  Mark Garcia has past medical history of new acute systolic CHF, hypertensive heart disease with heart failure, probable obstructive sleep apnea and morbid obesity.  His echocardiogram from 02/18/2019 showed an ejection fraction of 25 to 30%, severely decreased left ventricular function, moderate LVH, global hypokinesis, severely dilated left ventricle, grade 2 diastolic dysfunction, elevated left atrial pressures, and a dilated left atrium.  A recent chest x-ray showed interstitial pulmonary edema and cardiomegaly.  Mark Garcia was recently admitted to [facility] 02/17/2019- 02/18/2019.  He was newly diagnosed with acute systolic congestive heart failure at that time.  He presented to the hospital with increased shortness of breath on exertion and elevated blood pressure SBP greater than 220.  He had not been taking his medication due to lack of insurance however, now he has insurance through his new job.  He had noticed that he had gained some weight over the last several months and increased close sizes.  He did not have any lower extremity edema, and no orthopnea.  His girlfriend noted that he has PND.  He denied chest pain.  He was given IV Lasix and his breathing improved.  It was felt his findings were consistent with hypertensive heart disease.  His losartan was switched to Entresto 24-26 mg  twice daily, spironolactone 12.5  daily, and carvedilol 12.5 mg twice daily were added.  He presents to the clinic today for follow-up and states he has not had any further shortness of breath except with vigorous activity.  With normal activities he states that he does not have trouble with his breathing.  He has been following a low-sodium diet but states it is hard to find foods that do not contain sodium.  His blood pressure has been much better controlled at home.  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  I will increase his Sherryll Burger today and have him follow-up in 2 weeks.  He denies chest pain, shortness of breath, lower extremity edema, fatigue, palpitations, melena, hematuria, hemoptysis, diaphoresis, weakness, presyncope, syncope, orthopnea, and PND.    Home Medications    Prior to Admission medications   Medication Sig Start Date End Date Taking? Authorizing Provider  carvedilol (COREG) 12.5 MG tablet Take 1 tablet (12.5 mg total) by mouth 2 (two) times daily with a meal. 02/18/19   Bhagat, Bhavinkumar, PA  sacubitril-valsartan (ENTRESTO) 24-26 MG Take 1 tablet by mouth 2 (two) times daily. 02/19/19   Manson Passey, PA  spironolactone (ALDACTONE) 25 MG tablet Take 0.5 tablets (12.5 mg total) by mouth daily. 02/19/19   Manson Passey, PA    Family History    Family History  Problem Relation Age of Onset  . Scleroderma Mother   . Arthritis Mother   . Diabetes Maternal  Grandmother    He indicated that his mother is alive. He indicated that his father is alive. He indicated that his maternal grandmother is alive.  Social History    Social History   Socioeconomic History  . Marital status: Single    Spouse name: Not on file  . Number of children: Not on file  . Years of education: Not on file  . Highest education level: Not on file  Occupational History  . Occupation:  Animal nutritionist, 2nd shift    Employer: Conservation officer, historic buildings  Tobacco Use  . Smoking status: Former Smoker    Quit date: 02/04/2012    Years since quitting: 7.0  . Smokeless tobacco: Never Used  Substance and Sexual Activity  . Alcohol use: No  . Drug use: No  . Sexual activity: Not on file  Other Topics Concern  . Not on file  Social History Narrative   Pt lives with girlfriend.   Social Determinants of Health   Financial Resource Strain:   . Difficulty of Paying Living Expenses: Not on file  Food Insecurity:   . Worried About Charity fundraiser in the Last Year: Not on file  . Ran Out of Food in the Last Year: Not on file  Transportation Needs:   . Lack of Transportation (Medical): Not on file  . Lack of Transportation (Non-Medical): Not on file  Physical Activity:   . Days of Exercise per Week: Not on file  . Minutes of Exercise per Session: Not on file  Stress:   . Feeling of Stress : Not on file  Social Connections:   . Frequency of Communication with Friends and Family: Not on file  . Frequency of Social Gatherings with Friends and Family: Not on file  . Attends Religious Services: Not on file  . Active Member of Clubs or Organizations: Not on file  . Attends Archivist Meetings: Not on file  . Marital Status: Not on file  Intimate Partner Violence:   . Fear of Current or Ex-Partner: Not on file  . Emotionally Abused: Not on file  . Physically Abused: Not on file  . Sexually Abused: Not on file     Review of Systems    General:  No chills, fever, night sweats or weight changes.  Cardiovascular:  No chest pain, dyspnea on exertion, edema, orthopnea, palpitations, paroxysmal nocturnal dyspnea. Dermatological: No rash, lesions/masses Respiratory: No cough, dyspnea Urologic: No hematuria, dysuria Abdominal:   No nausea, vomiting, diarrhea, bright red blood per rectum, melena, or hematemesis Neurologic:  No visual changes, wkns, changes in mental  status. All other systems reviewed and are otherwise negative except as noted above.  Physical Exam    VS:  BP 124/82 (BP Location: Left Arm, Patient Position: Sitting, Cuff Size: Large)   Pulse 94   Wt (!) 324 lb 9.6 oz (147.2 kg)   SpO2 94%   BMI 46.58 kg/m  , BMI Body mass index is 46.58 kg/m. GEN: Well nourished, well developed, in no acute distress. HEENT: normal. Neck: Supple, no JVD, carotid bruits, or masses. Cardiac: RRR, no murmurs, rubs, or gallops. No clubbing, cyanosis, edema.  Radials/DP/PT 2+ and equal bilaterally.  Respiratory:  Respirations regular and unlabored, clear to auscultation bilaterally. GI: Soft, nontender, nondistended, BS + x 4. MS: no deformity or atrophy. Skin: warm and dry, no rash. Neuro:  Strength and sensation are intact. Psych: Normal affect.  Accessory Clinical Findings    ECG personally reviewed by me today-none  today  EKG 02/17/2019 Sinus tachycardia biatrial enlargement  Non-speci know he could do thatfic intra-ventricular conduction delay LVH with secondary repolarization abnormality Anterior ST elevation, probably due to LVH No previous ECGs available  Echocardiogram 02/18/2019 IMPRESSIONS    1. Left ventricular ejection fraction, by visual estimation, is 25 to 30%. The left ventricle has severely decreased function. There is moderately increased left ventricular hypertrophy.  2. The left ventricle demonstrates global hypokinesis.  3. Severely dilated left ventricular internal cavity size.  4. Left ventricular diastolic parameters are consistent with Grade II diastolic dysfunction (pseudonormalization).  5. Elevated left atrial pressure.  6. Global right ventricle has normal systolic function.The right ventricular size is normal.  7. Left atrial size was mildly dilated.  8. Right atrial size was normal.  9. The mitral valve is normal in structure. Mild mitral valve regurgitation. 10. The tricuspid valve is normal in  structure. 11. The aortic valve is normal in structure. Aortic valve regurgitation is not visualized. 12. The pulmonic valve was not well visualized. Pulmonic valve regurgitation is not visualized. 13. The inferior vena cava is normal in size with greater than 50% respiratory variability, suggesting right atrial pressure of 3 mmHg. 14. TR signal is inadequate for assessing pulmonary artery systolic pressure.  Assessment & Plan   1.  Acute systolic CHF-echocardiogram 02/18/2019 showed EF 25 to 30%, moderate LVH, global hypokinesis, severely dilated left ventricle, grade 2 diastolic dysfunction, elevated left atrial pressures, and a dilated left atrium.  Increase Entresto 49-51 twice daily Continue spironolactone 12.5 mg  daily Continue carvedilol 12.5 twice daily Daily weights Heart healthy low-sodium diet-salty 6 given Order BMP 2 weeks Recommend repeat echocardiogram when medication is optimized.  Hypertensive heart disease with heart failure-BP today 124/82 Increase Entresto 49-51 twice daily Continue spironolactone 12.5 daily Continue carvedilol 12.5 mg twice daily Healthy low-sodium diet Increase physical activity as tolerated-increase physical activity 15-20 minutes walking daily outside of work Blood pressure log  Morbid obesity-weight at discharge 313.6 pounds and weight today 324.6 Healthy low-sodium diet Increase physical activity as tolerated Continue weight loss  Disposition: Follow-up with me in 2 weeks.   Thomasene Ripple. Syla Devoss NP-C       [facility] [address] Office [phone] Fax [phone]

## 2019-03-07 ENCOUNTER — Ambulatory Visit (INDEPENDENT_AMBULATORY_CARE_PROVIDER_SITE_OTHER): Payer: 59 | Admitting: General Practice

## 2019-03-07 ENCOUNTER — Encounter (INDEPENDENT_AMBULATORY_CARE_PROVIDER_SITE_OTHER): Payer: Self-pay

## 2019-03-07 ENCOUNTER — Encounter: Payer: Self-pay | Admitting: General Practice

## 2019-03-07 ENCOUNTER — Other Ambulatory Visit: Payer: Self-pay

## 2019-03-07 VITALS — BP 124/82 | HR 94 | Wt 324.6 lb

## 2019-03-07 DIAGNOSIS — Z79899 Other long term (current) drug therapy: Secondary | ICD-10-CM

## 2019-03-07 DIAGNOSIS — I110 Hypertensive heart disease with heart failure: Secondary | ICD-10-CM

## 2019-03-07 DIAGNOSIS — I5021 Acute systolic (congestive) heart failure: Secondary | ICD-10-CM

## 2019-03-07 MED ORDER — SACUBITRIL-VALSARTAN 49-51 MG PO TABS: 1.0000 | ORAL_TABLET | Freq: Two times a day (BID) | ORAL | 6 refills | Status: DC

## 2019-03-07 NOTE — Patient Instructions (Signed)
Medication Instructions:  INCREASE ENTRESTO 49/51MG  TWICE DAILY If you need a refill on your cardiac medications before your next appointment, please call your pharmacy.  Special Instructions: PLEASE TRY TO INCREASE YOUR WALKING 15-20 MIN OUTSIDE OF YOUR WORK HOURS  PLEASE TAKE AND LOG YOUR BLOOD PRESSURE DAILY  PLEASE READ AND FOLLOW SALTY 6 ATTACHED  Reduce your risk of getting COVID-19 With your heart disease it is especially important for people at increased risk of severe illness from COVID-19, and those who live with them, to protect themselves from getting COVID-19. The best way to protect yourself and to help reduce the spread of the virus that causes COVID-19 is to: Marland Kitchen Limit your interactions with other people as much as possible. . Take precautions to prevent getting COVID-19 when you do interact with others. If you start feeling sick and think you may have COVID-19, get in touch with your healthcare provider within 24 hours.  Follow-Up: IN 2 WEEKS In Person Edd Fabian, FNP.    At [facility], you and your health needs are our priority.  As part of our continuing mission to provide you with exceptional heart care, we have created designated Provider Care Teams.  These Care Teams include your primary Cardiologist (physician) and Advanced Practice Providers (APPs -  Physician Assistants and Nurse Practitioners) who all work together to provide you with the care you need, when you need it.  Thank you for choosing [facility] at [facility]!!

## 2019-03-18 NOTE — Progress Notes (Signed)
Cardiology Clinic Note   Patient Name: Mark Garcia Date of Encounter: 03/21/2019  Primary Care Provider:  Patient, No Pcp Per Primary Cardiologist:  Peter Swazil[place], MD  Patient Profile    Mark Garcia 34 year old male presents today for follow-up of his congestive heart failure, hypertensive heart disease, and morbid obesity.  Past Medical History    Past Medical History:  Diagnosis Date  . Hypertension    Past Surgical History:  Procedure Laterality Date  . FINGER AMPUTATION Left    tip of L index finger>>got caught in a machine    Allergies  No Known Allergies  History of Present Illness   Mark Garcia has past medical history of new acute systolic CHF, hypertensive heart disease with heart failure, probable obstructive sleep apnea and morbid obesity.  His echocardiogram from 02/18/2019 showed an ejection fraction of 25 to 30%, severely decreased left ventricular function, moderate LVH, global hypokinesis, severely dilated left ventricle, grade 2 diastolic dysfunction, elevated left atrial pressures, and a dilated left atrium.  A recent chest x-ray showed interstitial pulmonary edema and cardiomegaly.  Mark Garcia was recently admitted to [facility] 02/17/2019- 02/18/2019.  He was newly diagnosed with acute systolic congestive heart failure at that time.  He presented to the hospital with increased shortness of breath on exertion and elevated blood pressure SBP greater than 220.  He had not been taking his medication due to lack of insurance however, now he has insurance through his new job.  He had noticed that he had gained some weight over the last several months and increased close sizes.  He did not have any lower extremity edema, and no orthopnea.  His girlfriend noted that he has PND.  He denied chest pain.  He was given IV Lasix and his breathing improved.  It was felt his findings were consistent with hypertensive heart disease.  His losartan was switched to Entresto 24-26  mg twice daily, spironolactone 12.5  daily, and carvedilol 12.5 mg twice daily were added.  He presented to the clinic 03/07/19 for follow-up and stated he had not had any further shortness of breath except with vigorous activity.  With normal activities he stated that he did not have trouble with his breathing.  He had been following a low-sodium diet but stated it was hard to find foods that did not contain sodium.  His blood pressure had been much better controlled at home.  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  I will increased his Entresto (49-51) at that time.   He presents the clinic today and states he feels well.  He has tolerated his increase in  [place] and his blood pressures have been in the 120s to 130s over 70s at home.  He is compliant with a low-sodium diet and has been getting extra walking and 3-4 times per week.  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  He is also lost about 2 pounds.  I will increase his Sherryll Burger again today and have him follow-up in 2 weeks.  He denies chest pain, shortness of breath, lower extremity edema, fatigue, palpitations, melena, hematuria, hemoptysis, diaphoresis, weakness, presyncope, syncope, orthopnea, and PND.   Home Medications    Prior to Admission medications   Medication Sig Start Date End Date Taking? Authorizing Provider  carvedilol (COREG) 12.5 MG tablet Take 1  tablet (12.5 mg total) by mouth 2 (two) times daily with a meal. 02/18/19   Bhagat, Bhavinkumar, PA  sacubitril-valsartan (ENTRESTO) 49-51 MG Take 1 tablet by mouth 2 (two) times daily. 03/07/19   Deberah Pelton, NP  spironolactone (ALDACTONE) 25 MG tablet Take 0.5 tablets (12.5 mg total) by mouth daily. 02/19/19   Leanor Kail, PA    Family History    Family History  Problem Relation Age of Onset  . Scleroderma Mother   .  Arthritis Mother   . Diabetes Maternal Grandmother    He indicated that his mother is alive. He indicated that his father is alive. He indicated that his maternal grandmother is alive.  Social History    Social History   Socioeconomic History  . Marital status: Single    Spouse name: Not on file  . Number of children: Not on file  . Years of education: Not on file  . Highest education level: Not on file  Occupational History  . Occupation: Animal nutritionist, 2nd shift    Employer: Conservation officer, historic buildings  Tobacco Use  . Smoking status: Former Smoker    Quit date: 02/04/2012    Years since quitting: 7.1  . Smokeless tobacco: Never Used  Substance and Sexual Activity  . Alcohol use: No  . Drug use: No  . Sexual activity: Not on file  Other Topics Concern  . Not on file  Social History Narrative   Pt lives with girlfriend.   Social Determinants of Health   Financial Resource Strain:   . Difficulty of Paying Living Expenses: Not on file  Food Insecurity:   . Worried About Charity fundraiser in the Last Year: Not on file  . Ran Out of Food in the Last Year: Not on file  Transportation Needs:   . Lack of Transportation (Medical): Not on file  . Lack of Transportation (Non-Medical): Not on file  Physical Activity:   . Days of Exercise per Week: Not on file  . Minutes of Exercise per Session: Not on file  Stress:   . Feeling of Stress : Not on file  Social Connections:   . Frequency of Communication with Friends and Family: Not on file  . Frequency of Social Gatherings with Friends and Family: Not on file  . Attends Religious Services: Not on file  . Active Member of Clubs or Organizations: Not on file  . Attends Archivist Meetings: Not on file  . Marital Status: Not on file  Intimate Partner Violence:   . Fear of Current or Ex-Partner: Not on file  . Emotionally Abused: Not on file  . Physically Abused: Not on file  . Sexually Abused: Not on file     Review of  Systems    General:  No chills, fever, night sweats or weight changes.  Cardiovascular:  No chest pain, dyspnea on exertion, edema, orthopnea, palpitations, paroxysmal nocturnal dyspnea. Dermatological: No rash, lesions/masses Respiratory: No cough, dyspnea Urologic: No hematuria, dysuria Abdominal:   No nausea, vomiting, diarrhea, bright red blood per rectum, melena, or hematemesis Neurologic:  No visual changes, wkns, changes in mental status. All other systems reviewed and are otherwise negative except as noted above.  Physical Exam    VS:  BP (!) 155/74   Pulse 84   Temp (!) 97.2 F (36.2 C)   Ht 5\' 10"  (1.778 m)   Wt (!) 322 lb 12.8 oz (146.4 kg)   SpO2 99%   BMI 46.32 kg/m  ,  BMI Body mass index is 46.32 kg/m. GEN: Well nourished, well developed, in no acute distress. HEENT: normal. Neck: Supple, no JVD, carotid bruits, or masses. Cardiac: RRR, no murmurs, rubs, or gallops. No clubbing, cyanosis, edema.  Radials/DP/PT 2+ and equal bilaterally.  Respiratory:  Respirations regular and unlabored, clear to auscultation bilaterally. GI: Soft, nontender, nondistended, BS + x 4. MS: no deformity or atrophy. Skin: warm and dry, no rash. Neuro:  Strength and sensation are intact. Psych: Normal affect.  Accessory Clinical Findings    ECG personally reviewed by me today-none today   EKG 02/17/2019 Sinus tachycardia biatrial enlargement  Non-speci know he could do thatfic intra-ventricular conduction delay LVH with secondary repolarization abnormality Anterior ST elevation, probably due to LVH No previous ECGs available  Echocardiogram 02/18/2019 IMPRESSIONS   1. Left ventricular ejection fraction, by visual estimation, is 25 to 30%. The left ventricle has severely decreased function. There is moderately increased left ventricular hypertrophy. 2. The left ventricle demonstrates global hypokinesis. 3. Severely dilated left ventricular internal cavity size. 4. Left  ventricular diastolic parameters are consistent with Grade II diastolic dysfunction (pseudonormalization). 5. Elevated left atrial pressure. 6. Global right ventricle has normal systolic function.The right ventricular size is normal. 7. Left atrial size was mildly dilated. 8. Right atrial size was normal. 9. The mitral valve is normal in structure. Mild mitral valve regurgitation. 10. The tricuspid valve is normal in structure. 11. The aortic valve is normal in structure. Aortic valve regurgitation is not visualized. 12. The pulmonic valve was not well visualized. Pulmonic valve regurgitation is not visualized. 13. The inferior vena cava is normal in size with greater than 50% respiratory variability, suggesting right atrial pressure of 3 mmHg. 14. TR signal is inadequate for assessing pulmonary artery systolic pressure.  Assessment & Plan   1.   Acute systolic CHF-echocardiogram 02/18/2019 showed EF 25 to 30%, moderate LVH, global hypokinesis, severely dilated left ventricle, grade 2 diastolic dysfunction, elevated left atrial pressures, and a dilated left atrium.  Increase Entresto to 97-103 mg twice daily Continue spironolactone 12.5 mg  daily Continue carvedilol 12.5 twice daily Daily weights Heart healthy low-sodium diet-salty 6 given Order BMP Recommend repeat echocardiogram 1 month after medication is optimized.  Hypertensive heart disease with heart failure-BP today 155/74.  At home 130-120 over 70's Increase Entresto to 97-103 mg twice daily Continue spironolactone 12.5 daily Continue carvedilol 12.5 mg twice daily Healthy low-sodium diet Increase physical activity as tolerated-increase physical activity 15-20 minutes walking daily outside of work 4-5x per week Blood pressure log  Morbid obesity-weight at discharge 313.6 pounds , weight 03/07/19 324.6, Weight today 322.8 pounds Healthy low-sodium diet Increase physical activity as tolerated Continue weight  loss  Disposition: Follow-up with me in 2 weeks.   Thomasene Ripple. Maanya Hippert NP-C       [facility] [address] Office [phone] Fax [phone]

## 2019-03-21 ENCOUNTER — Ambulatory Visit (INDEPENDENT_AMBULATORY_CARE_PROVIDER_SITE_OTHER): Payer: 59 | Admitting: General Practice

## 2019-03-21 ENCOUNTER — Encounter: Payer: Self-pay | Admitting: General Practice

## 2019-03-21 ENCOUNTER — Other Ambulatory Visit: Payer: Self-pay

## 2019-03-21 VITALS — BP 155/74 | HR 84 | Temp 97.2°F | Ht 70.0 in | Wt 322.8 lb

## 2019-03-21 DIAGNOSIS — I110 Hypertensive heart disease with heart failure: Secondary | ICD-10-CM | POA: Diagnosis not present

## 2019-03-21 DIAGNOSIS — I5023 Acute on chronic systolic (congestive) heart failure: Secondary | ICD-10-CM

## 2019-03-21 DIAGNOSIS — Z79899 Other long term (current) drug therapy: Secondary | ICD-10-CM

## 2019-03-21 MED ORDER — ENTRESTO 97-103 MG PO TABS: 1.0000 | ORAL_TABLET | Freq: Two times a day (BID) | ORAL | 3 refills | Status: DC

## 2019-03-21 NOTE — Patient Instructions (Signed)
Medication Instructions:  INCREASE ENTRESTO 97/103MG  TWICE DAILY If you need a refill on your cardiac medications before your next appointment, please call your pharmacy.  Labwork: BMET TODAY HERE IN OUR OFFICE AT [facility]    You will NOT need to fast   If you have labs (blood work) drawn today and your tests are completely normal, you will receive your results only by: Marland Kitchen MyChart Message (if you have MyChart) OR A paper copy in the mail If you have any lab test that is abnormal or we need to change your treatment, we will call you to review these results. You may go to any [facility] lab that is convenient for you however, we do have a lab in our office that is able to assist you. You do NOT need an appointment for our lab. Once in our office in our office lobby there is a podium where you sign-in and ring the doorbell to alert [place] that you are here. Lab is open 8:00am and closes at 4:00pm; closes for lunch from 12:45 - 1:45pm. PLEASE BRING A COPY OF YOUR INSURANCE CARD WITH YOU.  Special Instructions: INCREASE PHYSICAL ACTIVITY AS TOLERATED  Reduce your risk of getting COVID-19 With your heart disease it is especially important for people at increased risk of severe illness from COVID-19, and those who live with them, to protect themselves from getting COVID-19. The best way to protect yourself and to help reduce the spread of the virus that causes COVID-19 is to: Marland Kitchen Limit your interactions with other people as much as possible. . Take precautions to prevent getting COVID-19 when you do interact with others. If you start feeling sick and think you may have COVID-19, get in touch with your healthcare provider within 24 hours.  Follow-Up: 2 WEEKS  In Person Edd Fabian, FNP.    At [facility], you and your health needs are our priority.  As part of our continuing mission to provide you with exceptional heart care, we have created designated Provider Care Teams.  These Care Teams include your  primary Cardiologist (physician) and Advanced Practice Providers (APPs -  Physician Assistants and Nurse Practitioners) who all work together to provide you with the care you need, when you need it.  Thank you for choosing [facility] at [facility]!!

## 2019-03-22 LAB — BASIC METABOLIC PANEL
BUN/Creatinine Ratio: 9 (ref 9–20)
BUN: 10 mg/dL (ref 6–20)
CO2: 26 mmol/L (ref 20–29)
Calcium: 9.7 mg/dL (ref 8.7–10.2)
Chloride: 102 mmol/L (ref 96–106)
Creatinine, Ser: 1.07 mg/dL (ref 0.76–1.27)
GFR calc Af Amer: 103 mL/min/{1.73_m2} (ref >59)
GFR calc non Af Amer: 89 mL/min/{1.73_m2} (ref >59)
Glucose: 79 mg/dL (ref 65–99)
Potassium: 4.9 mmol/L (ref 3.5–5.2)
Sodium: 140 mmol/L (ref 134–144)

## 2019-04-04 NOTE — Progress Notes (Signed)
Cardiology Clinic Note   Patient Name: Mark Garcia Date of Encounter: 04/05/2019  Primary Care Provider:  Patient, No Pcp Per Primary Cardiologist:  Peter Martinique, MD  Patient Profile    Mark Garcia 34 year old male presents today for follow-up of his congestive heart failure, hypertensive heart disease, and morbid obesity.  Past Medical History    Past Medical History:  Diagnosis Date  . Hypertension    Past Surgical History:  Procedure Laterality Date  . 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 has past medical history ofnewacute systolic CHF, hypertensive heart disease with heart failure, probable obstructive sleep apnea and morbid obesity. His echocardiogram from 02/18/2019 showed an ejection fraction of 25 to 30%, severely decreased left ventricular function, moderate LVH, global hypokinesis, severely dilated left ventricle, grade 2 diastolic dysfunction, elevated left atrial pressures, and a dilated left atrium. A recent chest x-ray showed interstitial pulmonary edema and cardiomegaly.  Mr. [name] was recently admitted to [facility] 02/17/2019-02/18/2019. He was newly diagnosed with acute systolic congestive heart failure at that time. He presented to the hospital with increased shortness of breath on exertion and elevated blood pressure SBP greater than 220. He had not been taking his medication due to lack of insurance however, now he has insurance through his new job. He had noticed that he had gained some weight over the last several months and increased close sizes. He did not have any lower extremity edema, and no orthopnea. His girlfriend noted that he has PND. He denied chest pain. He was given IV Lasix and his breathing improved. It was felt his findings were consistent with hypertensive heart disease. His losartan was switched to Entresto  24-26 mg twice daily, spironolactone 12.5 daily, and carvedilol 12.5 mg twice daily were added.  He presented to the clinic 03/07/19 for follow-up and statedhe had not had any further shortness of breath except with vigorous activity. With normal activities he stated that he did not have trouble with his breathing. He had been following a low-sodium diet but stated it was hard to find foods that did not contain sodium. His blood pressure had been much better controlled at home. 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 I  increased his Entresto (49-51) at that time.   He presented the clinic 03/21/19 and stated he felt well.  He has tolerated his increase in  [place] and his blood pressures have been in the 120s to 130s over 70s at home.  He is compliant with a low-sodium diet and has been getting extra walking and 3-4 times per week.  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  He had also lost about 2 pounds.   He presents the clinic today for follow-up and states he continues to be physically active both in and out of work.  He continues to follow a low-sodium diet.  He is tolerating his Entresto well at the 97-1 03 dose.  I will increase his spironolactone to 25 mg today, ordered a BMP, and have him follow-up in 2 weeks.  Hedenies chest pain, shortness of breath, lower extremity edema, fatigue, palpitations, melena, hematuria, hemoptysis, diaphoresis, weakness, presyncope, syncope, orthopnea, and PND.  Home Medications  Prior to Admission medications   Medication Sig Start Date End Date Taking? Authorizing Provider  carvedilol (COREG) 12.5 MG tablet Take 1 tablet (12.5 mg total) by mouth 2 (two) times daily with a meal. 02/18/19   Bhagat, Bhavinkumar, PA  sacubitril-valsartan (ENTRESTO) 97-103 MG Take 1 tablet by mouth 2 (two) times daily.  03/21/19   Ronney Asters, NP  spironolactone (ALDACTONE) 25 MG tablet Take 0.5 tablets (12.5 mg total) by mouth daily. 02/19/19   Manson Passey, PA    Family History    Family History  Problem Relation Age of Onset  . Scleroderma Mother   . Arthritis Mother   . Diabetes Maternal Grandmother    He indicated that his mother is alive. He indicated that his father is alive. He indicated that his maternal grandmother is alive.  Social History    Social History   Socioeconomic History  . Marital status: Single    Spouse name: Not on file  . Number of children: Not on file  . Years of education: Not on file  . Highest education level: Not on file  Occupational History  . Occupation: Engineer, materials, 2nd shift    Employer: [employer]  Tobacco Use  . Smoking status: Former Smoker    Quit date: 02/04/2012    Years since quitting: 7.1  . Smokeless tobacco: Never Used  Substance and Sexual Activity  . Alcohol use: No  . Drug use: No  . Sexual activity: Not on file  Other Topics Concern  . Not on file  Social History Narrative   Pt lives with girlfriend.   Social Determinants of Health   Financial Resource Strain:   . Difficulty of Paying Living Expenses: Not on file  Food Insecurity:   . Worried About Programme researcher, broadcasting/film/video in the Last Year: Not on file  . Ran Out of Food in the Last Year: Not on file  Transportation Needs:   . Lack of Transportation (Medical): Not on file  . Lack of Transportation (Non-Medical): Not on file  Physical Activity:   . Days of Exercise per Week: Not on file  . Minutes of Exercise per Session: Not on file  Stress:   . Feeling of Stress : Not on file  Social Connections:   . Frequency of Communication with Friends and Family: Not on file  . Frequency of Social Gatherings with Friends and Family: Not on file  . Attends Religious Services: Not on file  . Active Member of Clubs or Organizations: Not on file  . Attends Tax inspector Meetings: Not on file  . Marital Status: Not on file  Intimate Partner Violence:   . Fear of Current or Ex-Partner: Not on file  . Emotionally Abused: Not on file  . Physically Abused: Not on file  . Sexually Abused: Not on file     Review of Systems    General:  No chills, fever, night sweats or weight changes.  Cardiovascular:  No chest pain, dyspnea on exertion, edema, orthopnea, palpitations, paroxysmal nocturnal dyspnea. Dermatological: No rash, lesions/masses Respiratory: No cough, dyspnea Urologic: No hematuria, dysuria Abdominal:   No nausea, vomiting, diarrhea, bright red blood per rectum, melena, or hematemesis Neurologic:  No visual changes, wkns, changes in mental status. All other systems reviewed and are otherwise negative except as noted above.  Physical Exam    VS:  BP 130/90 (BP Location: Left Arm, Patient Position: Sitting, Cuff Size: Large)   Pulse 86   Wt Marland Kitchen)  324 lb 12.8 oz (147.3 kg)   BMI 46.60 kg/m  , BMI Body mass index is 46.6 kg/m. GEN: Well nourished, well developed, in no acute distress. HEENT: normal. Neck: Supple, no JVD, carotid bruits, or masses. Cardiac: RRR, no murmurs, rubs, or gallops. No clubbing, cyanosis, edema.  Radials/DP/PT 2+ and equal bilaterally.  Respiratory:  Respirations regular and unlabored, clear to auscultation bilaterally. GI: Soft, nontender, nondistended, BS + x 4. MS: no deformity or atrophy. Skin: warm and dry, no rash. Neuro:  Strength and sensation are intact. Psych: Normal affect.  Accessory Clinical Findings    ECG personally reviewed by me today- None today   EKG 02/17/2019 Sinus tachycardia biatrial enlargement Non-speciknow he could do thatfic intra-ventricular conduction delay LVH with secondary repolarization abnormality Anterior ST elevation, probably due to LVH No previous ECGs available  Echocardiogram 02/18/2019 IMPRESSIONS   1. Left ventricular ejection fraction, by visual  estimation, is 25 to 30%. The left ventricle has severely decreased function. There is moderately increased left ventricular hypertrophy. 2. The left ventricle demonstrates global hypokinesis. 3. Severely dilated left ventricular internal cavity size. 4. Left ventricular diastolic parameters are consistent with Grade II diastolic dysfunction (pseudonormalization). 5. Elevated left atrial pressure. 6. Global right ventricle has normal systolic function.The right ventricular size is normal. 7. Left atrial size was mildly dilated. 8. Right atrial size was normal. 9. The mitral valve is normal in structure. Mild mitral valve regurgitation. 10. The tricuspid valve is normal in structure. 11. The aortic valve is normal in structure. Aortic valve regurgitation is not visualized. 12. The pulmonic valve was not well visualized. Pulmonic valve regurgitation is not visualized. 13. The inferior vena cava is normal in size with greater than 50% respiratory variability, suggesting right atrial pressure of 3 mmHg. 14. TR signal is inadequate for assessing pulmonary artery systolic pressure.  Assessment & Plan   1.  Acute systolic CHF-echocardiogram 02/18/2019 showed EF 25 to 30%, moderate LVH, global hypokinesis, severely dilated left ventricle, grade 2 diastolic dysfunction, elevated left atrial pressures, and a dilated left atrium. ContinueEntresto to 97-103 mgtwice daily Increase spironolactone to 25 mg daily Continue carvedilol 12.5 twice daily Daily weights Heart healthy low-sodium diet-salty 6  Order BMP Recommend repeat echocardiogram 1 month after medicationsareoptimized.  Hypertensive heart disease with heart failure-BP today 130/90  At home 130-120 over 70's ContinueEntresto to 97-103 mgtwice daily Increase spironolactone to 25 mg daily Continue carvedilol 12.5 mg twice daily Healthy low-sodium diet Increase physical activity as tolerated-increase physical activity 15-20  minutes walking daily outside of work 4-5x per week Blood pressure log  Morbid obesity-weight at discharge 313.6 pounds , weight 03/07/19 324.6, Weight today 324 pounds Healthy low-sodium diet Increase physical activity as tolerated Continue weight loss  Disposition: Follow-up with me in 2 weeks.   Thomasene Ripple. Maelle Sheaffer NP-C      [facility] [address] Office [phone] Fax [phone]

## 2019-04-05 ENCOUNTER — Ambulatory Visit (INDEPENDENT_AMBULATORY_CARE_PROVIDER_SITE_OTHER): Payer: 59 | Admitting: General Practice

## 2019-04-05 ENCOUNTER — Other Ambulatory Visit: Payer: Self-pay

## 2019-04-05 ENCOUNTER — Encounter: Payer: Self-pay | Admitting: General Practice

## 2019-04-05 VITALS — BP 130/90 | HR 86 | Wt 324.8 lb

## 2019-04-05 DIAGNOSIS — I5023 Acute on chronic systolic (congestive) heart failure: Secondary | ICD-10-CM

## 2019-04-05 DIAGNOSIS — Z79899 Other long term (current) drug therapy: Secondary | ICD-10-CM | POA: Diagnosis not present

## 2019-04-05 DIAGNOSIS — I110 Hypertensive heart disease with heart failure: Secondary | ICD-10-CM

## 2019-04-05 MED ORDER — SPIRONOLACTONE 25 MG PO TABS: 25.0000 mg | ORAL_TABLET | Freq: Every day | ORAL | 3 refills | Status: DC

## 2019-04-05 NOTE — Patient Instructions (Signed)
Medication Instructions:  INCREASE SPIRONOLACTONE 25MG  DAILY-WHOLE TABLET If you need a refill on your cardiac medications before your next appointment, please call your pharmacy.  Labwork: BMET TODAY HERE IN OUR OFFICE AT [facility]    If you have labs (blood work) drawn today and your tests are completely normal, you will receive your results only by: [facility] MyChart Message (if you have MyChart) OR A paper copy in the mail If you have any lab test that is abnormal or we need to change your treatment, we will call you to review these results. You may go to any [facility] lab that is convenient for you however, we do have a lab in our office that is able to assist you. You do NOT need an appointment for our lab. Once in our office in our office lobby there is a podium where you sign-in and ring the doorbell to alert Marland Kitchen that you are here. Lab is open 8:00am and closes at 4:00pm; closes for lunch from 12:45 - 1:45pm. PLEASE BRING A COPY OF YOUR INSURANCE CARD WITH YOU.  Special Instructions: PLEASE CONTINUE TO TAKE AND LOG YOUR BLOOD PRESSURE DAILY  Follow-Up: 2 WEEKS In Person [place], FNP.    At [facility], you and your health needs are our priority.  As part of our continuing mission to provide you with exceptional heart care, we have created designated Provider Care Teams.  These Care Teams include your primary Cardiologist (physician) and Advanced Practice Providers (APPs -  Physician Assistants and Nurse Practitioners) who all work together to provide you with the care you need, when you need it.  Reduce your risk of getting COVID-19 With your heart disease it is especially important for people at increased risk of severe illness from COVID-19, and those who live with them, to protect themselves from getting COVID-19. The best way to protect yourself and to help reduce the spread of the virus that causes COVID-19 is to: [facility] Limit your interactions with other people as much as  possible. . Take COVID-19 when you do interact with others. If you start feeling sick and think you may have COVID-19, get in touch with your healthcare provider within 24 hours.  Thank you for choosing [facility] at [facility]!!

## 2019-04-06 LAB — BASIC METABOLIC PANEL
BUN/Creatinine Ratio: 13 (ref 9–20)
BUN: 12 mg/dL (ref 6–20)
CO2: 25 mmol/L (ref 20–29)
Calcium: 9.9 mg/dL (ref 8.7–10.2)
Chloride: 100 mmol/L (ref 96–106)
Creatinine, Ser: 0.94 mg/dL (ref 0.76–1.27)
GFR calc Af Amer: 121 mL/min/{1.73_m2} (ref >59)
GFR calc non Af Amer: 105 mL/min/{1.73_m2} (ref >59)
Glucose: 86 mg/dL (ref 65–99)
Potassium: 4.7 mmol/L (ref 3.5–5.2)
Sodium: 138 mmol/L (ref 134–144)

## 2019-04-18 NOTE — Progress Notes (Signed)
Cardiology Clinic Note   Patient Name: Mark Garcia Date of Encounter: 04/19/2019  Primary Care Provider:  Patient, No Pcp Per Primary Cardiologist:  Peter [place], MD  Patient Profile    Mark Garcia 34 year old male presents today for follow-up of his congestive heart failure, hypertensive heart disease, and morbid obesity.  Past Medical History    Past Medical History:  Diagnosis Date  . Hypertension    Past Surgical History:  Procedure Laterality Date  . FINGER AMPUTATION Left    tip of L index finger>>got caught in a machine    Allergies  No Known Allergies  History of Present Illness    Mark Garcia has past medical history ofnewacute systolic CHF, hypertensive heart disease with heart failure, probable obstructive sleep apnea and morbid obesity. His echocardiogram from 02/18/2019 showed an ejection fraction of 25 to 30%, severely decreased left ventricular function, moderate LVH, global hypokinesis, severely dilated left ventricle, grade 2 diastolic dysfunction, elevated left atrial pressures, and a dilated left atrium. A recent chest x-ray showed interstitial pulmonary edema and cardiomegaly.  Mark Garcia was recently admitted to [facility] 02/17/2019-02/18/2019. He was newly diagnosed with acute systolic congestive heart failure at that time. He presented to the hospital with increased shortness of breath on exertion and elevated blood pressure SBP greater than 220. He had not been taking his medication due to lack of insurance however, now he has insurance through his new job. He had noticed that he had gained some weight over the last several months and increased close sizes. He did not have any lower extremity edema, and no orthopnea. His girlfriend noted that he has PND. He denied chest pain. He was given IV Lasix and his breathing improved. It was felt his findings were consistent with hypertensive heart disease. His losartan was switched to Entresto  24-26 mg twice daily, spironolactone 12.5 daily, and carvedilol 12.5 mg twice daily were added.  He presentedto the clinic 2/1/21for follow-up and statedhe hadnot had any further shortness of breath except with vigorous activity. With normal activities he statedthat he didnot have trouble with his breathing. He hadbeen following a low-sodium diet but statedit washard to find foods that didnot contain sodium. His blood pressure hadbeen much better controlled at home. 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 I  increasedhis Entresto (49-51) at that time.  He presented the clinic 03/21/19 and statedhe felt well. He has tolerated his increase in [place] and his blood pressures have been in the 120s to 130s over 70s at home. He is compliant with a low-sodium diet and has been getting extra walking and 3-4 times per week. 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 He had also lost about 2 pounds.   He presented to the clinic 04/05/19 for follow-up and stated he continued to be physically active both in and out of work.  He continued to follow a low-sodium diet.  He was tolerating his Entresto well at the 97-1 03 dose.  I increased his spironolactone to 25 mg, and ordered a BMP.  He presents today for follow-up evaluation and states he feels well today.  He is continue to increase his physical activity and eat a heart healthy diet.  He states that he is starting to feel much better with the additions of his heart medications.  He has not had any side effects  and has remained compliant.  I will increase his carvedilol to 25 today, repeat an echocardiogram and have him follow-up with Dr. Martinique in 3 months.  Today hedenies chest pain, shortness of breath, lower extremity edema, fatigue, palpitations, melena, hematuria, hemoptysis, diaphoresis,  weakness, presyncope, syncope, orthopnea, and PND.  Home Medications    Prior to Admission medications   Medication Sig Start Date End Date Taking? Authorizing Provider  carvedilol (COREG) 12.5 MG tablet Take 1 tablet (12.5 mg total) by mouth 2 (two) times daily with a meal. 02/18/19   Bhagat, Bhavinkumar, PA  sacubitril-valsartan (ENTRESTO) 97-103 MG Take 1 tablet by mouth 2 (two) times daily. 03/21/19   Deberah Pelton, NP  spironolactone (ALDACTONE) 25 MG tablet Take 1 tablet (25 mg total) by mouth daily. 04/05/19   Deberah Pelton, NP    Family History    Family History  Problem Relation Age of Onset  . Scleroderma Mother   . Arthritis Mother   . Diabetes Maternal Grandmother    He indicated that his mother is alive. He indicated that his father is alive. He indicated that his maternal grandmother is alive.  Social History    Social History   Socioeconomic History  . Marital status: Single    Spouse name: Not on file  . Number of children: Not on file  . Years of education: Not on file  . Highest education level: Not on file  Occupational History  . Occupation: Animal nutritionist, 2nd shift    Employer: Conservation officer, historic buildings  Tobacco Use  . Smoking status: Former Smoker    Quit date: 02/04/2012    Years since quitting: 7.2  . Smokeless tobacco: Never Used  Substance and Sexual Activity  . Alcohol use: No  . Drug use: No  . Sexual activity: Not on file  Other Topics Concern  . Not on file  Social History Narrative   Pt lives with girlfriend.   Social Determinants of Health   Financial Resource Strain:   . Difficulty of Paying Living Expenses:   Food Insecurity:   . Worried About Charity fundraiser in the Last Year:   . Arboriculturist in the Last Year:   Transportation Needs:   . Film/video editor (Medical):   Marland Kitchen Lack of Transportation (Non-Medical):   Physical Activity:   . Days of Exercise per Week:   . Minutes of Exercise per Session:   Stress:   .  Feeling of Stress :   Social Connections:   . Frequency of Communication with Friends and Family:   . Frequency of Social Gatherings with Friends and Family:   . Attends Religious Services:   . Active Member of Clubs or Organizations:   . Attends Archivist Meetings:   Marland Kitchen Marital Status:   Intimate Partner Violence:   . Fear of Current or Ex-Partner:   . Emotionally Abused:   Marland Kitchen Physically Abused:   . Sexually Abused:      Review of Systems    General:  No chills, fever, night sweats or weight changes.  Cardiovascular:  No chest pain, dyspnea on exertion, edema, orthopnea, palpitations, paroxysmal nocturnal dyspnea. Dermatological: No rash, lesions/masses Respiratory: No cough, dyspnea Urologic: No hematuria, dysuria Abdominal:   No nausea, vomiting, diarrhea, bright red blood per rectum, melena, or hematemesis Neurologic:  No visual changes, wkns, changes in mental status. All other systems reviewed and are otherwise negative except as noted above.  Physical Exam    VS:  BP 138/80   Pulse 77   Temp (!) 97.3 F (36.3 C)   Ht 5\' 10"  (1.778 m)   Wt (!) 333 lb (151 kg)   SpO2 97%   BMI 47.78 kg/m  , BMI Body mass index is 47.78 kg/m. GEN: Well nourished, well developed, in no acute distress. HEENT: normal. Neck: Supple, no JVD, carotid bruits, or masses. Cardiac: RRR, no murmurs, rubs, or gallops. No clubbing, cyanosis, edema.  Radials/DP/PT 2+ and equal bilaterally.  Respiratory:  Respirations regular and unlabored, clear to auscultation bilaterally. GI: Soft, nontender, nondistended, BS + x 4. MS: no deformity or atrophy. Skin: warm and dry, no rash. Neuro:  Strength and sensation are intact. Psych: Normal affect.  Accessory Clinical Findings    ECG personally reviewed by me today-none today  EKG 02/17/2019 Sinus tachycardia biatrial enlargement Non-speciknow he could do thatfic intra-ventricular conduction delay LVH with secondary repolarization  abnormality Anterior ST elevation, probably due to LVH No previous ECGs available  Echocardiogram 02/18/2019 IMPRESSIONS   1. Left ventricular ejection fraction, by visual estimation, is 25 to 30%. The left ventricle has severely decreased function. There is moderately increased left ventricular hypertrophy. 2. The left ventricle demonstrates global hypokinesis. 3. Severely dilated left ventricular internal cavity size. 4. Left ventricular diastolic parameters are consistent with Grade II diastolic dysfunction (pseudonormalization). 5. Elevated left atrial pressure. 6. Global right ventricle has normal systolic function.The right ventricular size is normal. 7. Left atrial size was mildly dilated. 8. Right atrial size was normal. 9. The mitral valve is normal in structure. Mild mitral valve regurgitation. 10. The tricuspid valve is normal in structure. 11. The aortic valve is normal in structure. Aortic valve regurgitation is not visualized. 12. The pulmonic valve was not well visualized. Pulmonic valve regurgitation is not visualized. 13. The inferior vena cava is normal in size with greater than 50% respiratory variability, suggesting right atrial pressure of 3 mmHg. 14. TR signal is inadequate for assessing pulmonary artery systolic pressure.  Assessment & Plan   1.   Acute systolic CHF-echocardiogram 02/18/2019 showed EF 25 to 30%, moderate LVH, global hypokinesis, severely dilated left ventricle, grade 2 diastolic dysfunction, elevated left atrial pressures, and a dilated left atrium. ContinueEntrestoto 97-103 mgtwice daily Continue spironolactone to 25 mg daily Increase carvedilol to 25 twice daily Daily weights Heart healthy low-sodium diet-salty 6  Continue to monitor blood pressure biweekly Order BMP Repeat echocardiogrammid April.  Hypertensive heart disease with heart failure-BP May At home continues to have blood pressure in the 130-120 over  70's ContinueEntrestoto 97-103 mgtwice daily Increase spironolactone to 25 mg daily Increase carvedilol to 25 twice daily Healthy low-sodium diet Increase physical activity as tolerated-increase physical activity 20-30 minutes walking daily outside of work4-5x per week Blood pressure log biweekly  Morbid obesity-weight at discharge 313.6 pounds,weight 2/1/21324.6, Weight today333 pounds up from 324 pounds 2 weeks ago Healthy low-sodium diet Increase physical activity as tolerated Continue weight loss  Disposition: Follow-up withDr. 05/05/2130 in 3 months.   [place]. Rourke Mcquitty NP-C      [facility] [address] Office [phone] Fax [phone]

## 2019-04-19 ENCOUNTER — Encounter: Payer: Self-pay | Admitting: General Practice

## 2019-04-19 ENCOUNTER — Other Ambulatory Visit: Payer: Self-pay

## 2019-04-19 ENCOUNTER — Ambulatory Visit (INDEPENDENT_AMBULATORY_CARE_PROVIDER_SITE_OTHER): Payer: 59 | Admitting: General Practice

## 2019-04-19 VITALS — BP 138/80 | HR 77 | Temp 97.3°F | Ht 70.0 in | Wt 333.0 lb

## 2019-04-19 DIAGNOSIS — Z79899 Other long term (current) drug therapy: Secondary | ICD-10-CM | POA: Diagnosis not present

## 2019-04-19 DIAGNOSIS — I110 Hypertensive heart disease with heart failure: Secondary | ICD-10-CM

## 2019-04-19 DIAGNOSIS — I5023 Acute on chronic systolic (congestive) heart failure: Secondary | ICD-10-CM

## 2019-04-19 MED ORDER — CARVEDILOL 25 MG PO TABS: 25.0000 mg | ORAL_TABLET | Freq: Two times a day (BID) | ORAL | 3 refills | Status: DC

## 2019-04-19 NOTE — Patient Instructions (Signed)
Medication Instructions:  INCREASE CARVEDILOL 25MG  DAILY If you need a refill on your cardiac medications before your next appointment, please call your pharmacy.  Labwork: BMET TODAY HERE IN OUR OFFICE AT [facility]    If you have labs (blood work) drawn today and your tests are completely normal, you will receive your results only by: Marland Kitchen MyChart Message (if you have MyChart) OR A paper copy in the mail If you have any lab test that is abnormal or we need to change your treatment, we will call you to review these results. You may go to any [facility] lab that is convenient for you however, we do have a lab in our office that is able to assist you. You do NOT need an appointment for our lab. Once in our office in our office lobby there is a podium where you sign-in and ring the doorbell to alert [place] that you are here. Lab is open 8:00am and closes at 4:00pm; closes for lunch from 12:45 - 1:45pm. PLEASE BRING A COPY OF YOUR INSURANCE CARD WITH YOU.  Testing/Procedures: Echocardiogram IN 1 MONTH (ABOUT 05-24-2019) - Your physician has requested that you have an echocardiogram. Echocardiography is a painless test that uses sound waves to create images of your heart. It provides your doctor with information about the size and shape of your heart and how well your heart's chambers and valves are working. This procedure takes approximately one hour. There are no restrictions for this procedure. This will be performed at our [facility] location - [address], [address].  Special Instructions: CONTINUE DAILY WEIGHTS AND BRING LOG WITH YOU TO FOLLOW UP APPOINTMENT.  TAKE BLOOD PRESSURE AT LEAST 2 TIMES A WEEK AND BRING WITH YOU TO FOLLOW UP APPOINTMENT.  PLEASE READ AND FOLLOW HEART HEALTHY DIET ATTACHED  PLEASE READ AND FOLLOW SALTY 6 ATTACHED  Follow-Up: 3 months  In Person Peter Martinique, MD.    At [facility], you and your health needs are our priority.  As part of our continuing mission to provide  you with exceptional heart care, we have created designated Provider Care Teams.  These Care Teams include your primary Cardiologist (physician) and Advanced Practice Providers (APPs -  Physician Assistants and Nurse Practitioners) who all work together to provide you with the care you need, when you need it.  Reduce your risk of getting COVID-19 With your heart disease it is especially important for people at increased risk of severe illness from COVID-19, and those who live with them, to protect themselves from getting COVID-19. The best way to protect yourself and to help reduce the spread of the virus that causes COVID-19 is to: Marland Kitchen Limit your interactions with other people as much as possible. . Take COVID-19 when you do interact with others. If you start feeling sick and think you may have COVID-19, get in touch with your healthcare provider within 24 hours.  Thank you for choosing [facility] at [facility]!!         HEART HEALTHY DIET Getting too much fat and cholesterol in your diet may cause health problems. Choosing the right foods helps keep your fat and cholesterol at normal levels. This can keep you from getting certain diseases. Your doctor may recommend an eating plan that includes:  Total fat: ______% or less of total calories a day.  Saturated fat: ______% or less of total calories a day.  Cholesterol: less than _________mg a day.  Fiber: ______g a day. What are tips  for following this plan? Meal planning  At meals, divide your plate into four equal parts: ? Fill one-half of your plate with vegetables and green salads. ? Fill one-fourth of your plate with whole grains. ? Fill one-fourth of your plate with low-fat (lean) protein foods.  Eat fish that is high in omega-3 fats at least two times a week. This includes mackerel, tuna, sardines, and salmon.  Eat foods that are high in fiber, such as whole grains, beans, apples, broccoli, carrots, peas, and  barley. General tips   Work with your doctor to lose weight if you need to.  Avoid: ? Foods with added sugar. ? Fried foods. ? Foods with partially hydrogenated oils.  Limit alcohol intake to no more than 1 drink a day for nonpregnant women and 2 drinks a day for men. One drink equals 12 oz of beer, 5 oz of wine, or 1 oz of hard liquor. Reading food labels  Check food labels for: ? Trans fats. ? Partially hydrogenated oils. ? Saturated fat (g) in each serving. ? Cholesterol (mg) in each serving. ? Fiber (g) in each serving.  Choose foods with healthy fats, such as: ? Monounsaturated fats. ? Polyunsaturated fats. ? Omega-3 fats.  Choose grain products that have whole grains. Look for the word "whole" as the first word in the ingredient list. Cooking  Cook foods using low-fat methods. These include baking, boiling, grilling, and broiling.  Eat more home-cooked foods. Eat at restaurants and buffets less often.  Avoid cooking using saturated fats, such as butter, cream, palm oil, palm kernel oil, and coconut oil. Recommended foods  Fruits  All fresh, canned (in natural juice), or frozen fruits. Vegetables  Fresh or frozen vegetables (raw, steamed, roasted, or grilled). Green salads. Grains  Whole grains, such as whole wheat or whole grain breads, crackers, cereals, and pasta. Unsweetened oatmeal, bulgur, barley, quinoa, or brown rice. Corn or whole wheat flour tortillas. Meats and other protein foods  Ground beef (85% or leaner), grass-fed beef, or beef trimmed of fat. Skinless chicken or [place]. Ground chicken or [place]. Pork trimmed of fat. All fish and seafood. Egg whites. Dried beans, peas, or lentils. Unsalted nuts or seeds. Unsalted canned beans. Nut butters without added sugar or oil. Dairy  Low-fat or nonfat dairy products, such as skim or 1% milk, 2% or reduced-fat cheeses, low-fat and fat-free ricotta or cottage cheese, or plain low-fat and nonfat  yogurt. Fats and oils  Tub margarine without trans fats. Light or reduced-fat mayonnaise and salad dressings. Avocado. Olive, canola, sesame, or safflower oils. The items listed above may not be a complete list of foods and beverages you can eat. Contact a dietitian for more information. Foods to avoid Fruits  Canned fruit in heavy syrup. Fruit in cream or butter sauce. Fried fruit. Vegetables  Vegetables cooked in cheese, cream, or butter sauce. Fried vegetables. Grains  White bread. White pasta. White rice. Cornbread. Bagels, pastries, and croissants. Crackers and snack foods that contain trans fat and hydrogenated oils. Meats and other protein foods  Fatty cuts of meat. Ribs, chicken wings, bacon, sausage, bologna, salami, chitterlings, fatback, hot dogs, bratwurst, and packaged lunch meats. Liver and organ meats. Whole eggs and egg yolks. Chicken and [place] with skin. Fried meat. Dairy  Whole or 2% milk, cream, half-and-half, and cream cheese. Whole milk cheeses. Whole-fat or sweetened yogurt. Full-fat cheeses. Nondairy creamers and whipped toppings. Processed cheese, cheese spreads, and cheese curds. Beverages  Alcohol. Sugar-sweetened drinks such as sodas,  lemonade, and fruit drinks. Fats and oils  Butter, stick margarine, lard, shortening, ghee, or bacon fat. Coconut, palm kernel, and palm oils. Sweets and desserts  Corn syrup, sugars, honey, and molasses. Candy. Jam and jelly. Syrup. Sweetened cereals. Cookies, pies, cakes, donuts, muffins, and ice cream. The items listed above may not be a complete list of foods and beverages you should avoid. Contact a dietitian for more information. Summary  Choosing the right foods helps keep your fat and cholesterol at normal levels. This can keep you from getting certain diseases.  At meals, fill one-half of your plate with vegetables and green salads.  Eat high-fiber foods, like whole grains, beans, apples, carrots, peas, and  barley.  Limit added sugar, saturated fats, alcohol, and fried foods. This information is not intended to replace advice given to you by your health care provider. Make sure you discuss any questions you have with your health care provider. Document Revised: 09/23/2017 Document Reviewed: 10/07/2016 Elsevier Patient Education  2020 [employer].

## 2019-04-20 LAB — BASIC METABOLIC PANEL
BUN/Creatinine Ratio: 14 (ref 9–20)
BUN: 11 mg/dL (ref 6–20)
CO2: 26 mmol/L (ref 20–29)
Calcium: 9.7 mg/dL (ref 8.7–10.2)
Chloride: 100 mmol/L (ref 96–106)
Creatinine, Ser: 0.79 mg/dL (ref 0.76–1.27)
GFR calc Af Amer: 134 mL/min/{1.73_m2} (ref >59)
GFR calc non Af Amer: 116 mL/min/{1.73_m2} (ref >59)
Glucose: 105 mg/dL — ABNORMAL HIGH (ref 65–99)
Potassium: 4.5 mmol/L (ref 3.5–5.2)
Sodium: 139 mmol/L (ref 134–144)

## 2019-05-24 ENCOUNTER — Other Ambulatory Visit (HOSPITAL_COMMUNITY): Payer: 59

## 2019-05-31 NOTE — Telephone Encounter (Signed)
Called pharmacy d/w pharmacist no RX group number listed RX bin# [id number] RX [id number] N Finished filling out form and faxed, pt notified via Epic email

## 2019-06-06 ENCOUNTER — Telehealth: Payer: Self-pay

## 2019-06-06 NOTE — Telephone Encounter (Signed)
Received fax on 06/03/19 from [employer] that the patient was approved for Entresto until 02/03/2020 at no out of pocket cost.

## 2019-06-13 ENCOUNTER — Other Ambulatory Visit: Payer: Self-pay | Admitting: General Practice

## 2019-06-14 ENCOUNTER — Other Ambulatory Visit: Payer: Self-pay

## 2019-06-14 ENCOUNTER — Ambulatory Visit (HOSPITAL_COMMUNITY): Payer: 59 | Attending: Cardiovascular Disease

## 2019-06-14 DIAGNOSIS — I110 Hypertensive heart disease with heart failure: Secondary | ICD-10-CM | POA: Diagnosis present

## 2019-06-14 DIAGNOSIS — I5023 Acute on chronic systolic (congestive) heart failure: Secondary | ICD-10-CM | POA: Insufficient documentation

## 2019-06-14 MED ORDER — PERFLUTREN LIPID MICROSPHERE
1.0000 mL | INTRAVENOUS | Status: AC | PRN
  Administered 2019-06-14: 2 mL via INTRAVENOUS

## 2019-07-15 NOTE — Progress Notes (Signed)
Cardiology Office Note   Date:  07/18/2019   ID:  Mark Garcia, DOB [birth year], MRN [id number]  PCP:  Patient, No Pcp Per  Cardiologist:   Mark Waterworth [place], MD   Chief Complaint  Patient presents with  . Congestive Heart Failure      History of Present Illness: Mark Garcia is a 34 y.o. male who presents for follow up CHF and hypertensive heart disease. He was admitted in January with Acute systolic CHF.  His echocardiogram from 02/18/2019 showed an ejection fraction of 25 to 30%, severely decreased left ventricular function, moderate LVH, global hypokinesis, severely dilated left ventricle, grade 2 diastolic dysfunction, elevated left atrial pressures, and a dilated left atrium. A  chest x-ray showed interstitial pulmonary edema and cardiomegaly. BP was greater than 875 systolic.  He had not been taking his medication due to lack of insurance however, now he has insurance through his new job.  He was given IV Lasix and his breathing improved. It was felt his findings were consistent with hypertensive heart disease. His losartan was switched to Entresto 24-26 mg twice daily, spironolactone 12.5 daily, and carvedilol 12.5 mg twice daily were added. He has had several outpatient follow up visits with additional titration of medication. He has been compliant with therapy. He works as a Presenter, broadcasting. Repeat Echo done 06/14/19 and was no change with persistent EF 25-30%.   He reports that he is feeling so much better. Denies any chest pain or dyspnea. He is more energetic and more active. Does a lot of walking on the job. Is much more careful with diet. His wife is now 7 months pregnant so he is really motivated to take care of himself. Wife does note some apnea and snoring at night.   Past Medical History:  Diagnosis Date  . Hypertension     Past Surgical History:  Procedure Laterality Date  . FINGER AMPUTATION Left    tip of L index finger>>got caught in a machine     Current  Outpatient Medications  Medication Sig Dispense Refill  . carvedilol (COREG) 25 MG tablet Take 1 tablet (25 mg total) by mouth 2 (two) times daily with a meal. 30 tablet 3  . sacubitril-valsartan (ENTRESTO) 97-103 MG Take 1 tablet by mouth 2 (two) times daily. 60 tablet 3  . spironolactone (ALDACTONE) 25 MG tablet TAKE 1 TABLET(25 MG) BY MOUTH DAILY 30 tablet 4   No current facility-administered medications for this visit.    Allergies:   Patient has no known allergies.    Social History:  The patient  reports that he quit smoking about 7 years ago. He has never used smokeless tobacco. He reports that he does not drink alcohol and does not use drugs.   Family History:  The patient's family history includes Arthritis in his mother; Diabetes in his maternal grandmother; Scleroderma in his mother.    ROS:  Please see the history of present illness.   Otherwise, review of systems are positive for none.   All other systems are reviewed and negative.    PHYSICAL EXAM: VS:  BP 126/82   Pulse 78   Ht 5\' 10"  (1.778 m)   Wt (!) 330 lb 3.2 oz (149.8 kg)   SpO2 94%   BMI 47.38 kg/m  , BMI Body mass index is 47.38 kg/m. GEN: Well nourished, obese, in no acute distress  HEENT: normal  Neck: no JVD, carotid bruits, or masses Cardiac: RRR; no murmurs, rubs, or gallops,no edema  Respiratory:  clear to auscultation bilaterally, normal work of breathing GI: soft, nontender, nondistended, + BS MS: no deformity or atrophy  Skin: warm and dry, no rash Neuro:  Strength and sensation are intact Psych: euthymic mood, full affect   EKG:  EKG is not ordered today. The ekg ordered today demonstrates N/A   Recent Labs: 02/17/2019: B Natriuretic Peptide 574.8; Hemoglobin 14.4; Platelets 213 02/18/2019: ALT 34; TSH 2.559 04/19/2019: BUN 11; Creatinine, Ser 0.79; Potassium 4.5; Sodium 139    Lipid Panel    Component Value Date/Time   CHOL 144 02/18/2019 0407   TRIG 65 02/18/2019 0407   HDL 42  02/18/2019 0407   CHOLHDL 3.4 02/18/2019 0407   VLDL 13 02/18/2019 0407   LDLCALC 89 02/18/2019 0407      Wt Readings from Last 3 Encounters:  07/18/19 (!) 330 lb 3.2 oz (149.8 kg)  04/19/19 (!) 333 lb (151 kg)  04/05/19 (!) 324 lb 12.8 oz (147.3 kg)      Other studies Reviewed: Additional studies/ records that were reviewed today include:  Echocardiogram 02/18/2019 IMPRESSIONS   1. Left ventricular ejection fraction, by visual estimation, is 25 to 30%. The left ventricle has severely decreased function. There is moderately increased left ventricular hypertrophy. 2. The left ventricle demonstrates global hypokinesis. 3. Severely dilated left ventricular internal cavity size. 4. Left ventricular diastolic parameters are consistent with Grade II diastolic dysfunction (pseudonormalization). 5. Elevated left atrial pressure. 6. Global right ventricle has normal systolic function.The right ventricular size is normal. 7. Left atrial size was mildly dilated. 8. Right atrial size was normal. 9. The mitral valve is normal in structure. Mild mitral valve regurgitation. 10. The tricuspid valve is normal in structure. 11. The aortic valve is normal in structure. Aortic valve regurgitation is not visualized. 12. The pulmonic valve was not well visualized. Pulmonic valve regurgitation is not visualized. 13. The inferior vena cava is normal in size with greater than 50% respiratory variability, suggesting right atrial pressure of 3 mmHg. 14. TR signal is inadequate for assessing pulmonary artery systolic pressure.  Echo 06/14/19: IMPRESSIONS    1. No LV thrombus on contrast imaging. Left ventricular ejection  fraction, by estimation, is 25 to 30%. The left ventricle has severely  decreased function. The left ventricle demonstrates global hypokinesis.  The left ventricular internal cavity size was  moderately to severely dilated. Left ventricular diastolic parameters are   consistent with Grade I diastolic dysfunction (impaired relaxation).  2. Right ventricular systolic function is mildly reduced. The right  ventricular size is normal. Tricuspid regurgitation signal is inadequate  for assessing PA pressure.  3. Left atrial size was mildly dilated.  4. The mitral valve is grossly normal. Mild mitral valve regurgitation.  No evidence of mitral stenosis.  5. The aortic valve is tricuspid. Aortic valve regurgitation is not  visualized. No aortic stenosis is present.   Comparison(s): No significant change from prior study.    ASSESSMENT AND PLAN:  1.  Acute on chronic systolic CHF. EF is still 25-30% despite optimal medical therapy but clinically he is doing much better and is class 1-2. Will continue current therapy. Encourage weight loss. Continue lifestyle modification. 2. Morbid obesity. Some symptoms of sleep apnea. Will arrange for sleep study 3. HTN with hypertensive heart disease. BP is well controlled.   Current medicines are reviewed at length with the patient today.  The patient does not have concerns regarding medicines.  The following changes have been made:  no change  Labs/ tests ordered today include:   Orders Placed This Encounter  Procedures  . Split night study     Disposition:   FU with me in 6 months  Signed, Miciah Shealy [place], MD  07/18/2019 4:13 PM    [facility] [address], [place], [place], [address] Phone [phone], Fax [phone]

## 2019-07-18 ENCOUNTER — Encounter: Payer: Self-pay | Admitting: Cardiology

## 2019-07-18 ENCOUNTER — Ambulatory Visit (INDEPENDENT_AMBULATORY_CARE_PROVIDER_SITE_OTHER): Payer: 59 | Admitting: Cardiology

## 2019-07-18 ENCOUNTER — Other Ambulatory Visit: Payer: Self-pay

## 2019-07-18 DIAGNOSIS — I5023 Acute on chronic systolic (congestive) heart failure: Secondary | ICD-10-CM

## 2019-07-18 DIAGNOSIS — I110 Hypertensive heart disease with heart failure: Secondary | ICD-10-CM | POA: Diagnosis not present

## 2019-07-18 DIAGNOSIS — R0683 Snoring: Secondary | ICD-10-CM | POA: Diagnosis not present

## 2019-07-18 NOTE — Patient Instructions (Signed)
Medication Instructions:  Continue same medications *If you need a refill on your cardiac medications before your next appointment, please call your pharmacy*   Lab Work: None ordered   Testing/Procedures: Schedule Sleep study   Follow-Up: At [facility], you and your health needs are our priority.  As part of our continuing mission to provide you with exceptional heart care, we have created designated Provider Care Teams.  These Care Teams include your primary Cardiologist (physician) and Advanced Practice Providers (APPs -  Physician Assistants and Nurse Practitioners) who all work together to provide you with the care you need, when you need it.  We recommend signing up for the patient portal called "MyChart".  Sign up information is provided on this After Visit Summary.  MyChart is used to connect with patients for Virtual Visits (Telemedicine).  Patients are able to view lab/test results, encounter notes, upcoming appointments, etc.  Non-urgent messages can be sent to your provider as well.   To learn more about what you can do with MyChart, go to [email or web address].    Your next appointment: 6 months    Call in Sept to schedule Dec appointment     The format for your next appointment: Office     Provider:  Dr.Jordan

## 2019-07-22 ENCOUNTER — Telehealth: Payer: Self-pay | Admitting: *Deleted

## 2019-07-22 NOTE — Telephone Encounter (Signed)
-----   Message from Gaynelle Cage, [place] sent at 07/21/2019 10:45 AM EDT -----  ----- Message ----- From: Pryor Curia Sent: 07/18/2019   4:19 PM EDT To: Gaynelle Cage, CMA, Theressa Stamps, RN, #  Needs to scheduled for Sleep study

## 2019-08-02 ENCOUNTER — Other Ambulatory Visit: Payer: Self-pay | Admitting: General Practice

## 2019-10-31 ENCOUNTER — Other Ambulatory Visit: Payer: Self-pay

## 2019-10-31 ENCOUNTER — Emergency Department (HOSPITAL_COMMUNITY): Payer: 59

## 2019-10-31 ENCOUNTER — Encounter (HOSPITAL_COMMUNITY): Payer: Self-pay | Admitting: Student

## 2019-10-31 ENCOUNTER — Emergency Department (HOSPITAL_COMMUNITY)
Admission: EM | Admit: 2019-10-31 | Discharge: 2019-10-31 | Disposition: A | Discharge status: Home / Self Care | Payer: 59 | Attending: Emergency Medicine | Admitting: Emergency Medicine

## 2019-10-31 DIAGNOSIS — I110 Hypertensive heart disease with heart failure: Secondary | ICD-10-CM | POA: Insufficient documentation

## 2019-10-31 DIAGNOSIS — D696 Thrombocytopenia, unspecified: Secondary | ICD-10-CM

## 2019-10-31 DIAGNOSIS — I5021 Acute systolic (congestive) heart failure: Secondary | ICD-10-CM | POA: Diagnosis not present

## 2019-10-31 DIAGNOSIS — Z79899 Other long term (current) drug therapy: Secondary | ICD-10-CM | POA: Insufficient documentation

## 2019-10-31 DIAGNOSIS — S42292A Other displaced fracture of upper end of left humerus, initial encounter for closed fracture: Secondary | ICD-10-CM

## 2019-10-31 DIAGNOSIS — R569 Unspecified convulsions: Secondary | ICD-10-CM

## 2019-10-31 LAB — RAPID URINE DRUG SCREEN, HOSP PERFORMED
Amphetamines: NOT DETECTED
Barbiturates: NOT DETECTED
Benzodiazepines: NOT DETECTED
Cocaine: NOT DETECTED
Opiates: NOT DETECTED
Tetrahydrocannabinol: POSITIVE — AB

## 2019-10-31 LAB — CBC WITH DIFFERENTIAL/PLATELET
Abs Immature Granulocytes: 0.01 10*3/uL (ref 0.00–0.07)
Basophils Absolute: 0 10*3/uL (ref 0.0–0.1)
Basophils Relative: 1 %
Eosinophils Absolute: 0.2 10*3/uL (ref 0.0–0.5)
Eosinophils Relative: 5 %
HCT: 45 % (ref 39.0–52.0)
Hemoglobin: 14.5 g/dL (ref 13.0–17.0)
Immature Granulocytes: 0 %
Lymphocytes Relative: 19 %
Lymphs Abs: 0.8 10*3/uL (ref 0.7–4.0)
MCH: 28.6 pg (ref 26.0–34.0)
MCHC: 32.2 g/dL (ref 30.0–36.0)
MCV: 88.8 fL (ref 80.0–100.0)
Monocytes Absolute: 0.3 10*3/uL (ref 0.1–1.0)
Monocytes Relative: 7 %
Neutro Abs: 2.8 10*3/uL (ref 1.7–7.7)
Neutrophils Relative %: 68 %
Platelets: 97 10*3/uL — ABNORMAL LOW (ref 150–400)
RBC: 5.07 MIL/uL (ref 4.22–5.81)
RDW: 12.1 % (ref 11.5–15.5)
WBC: 4 10*3/uL (ref 4.0–10.5)
nRBC: 0 % (ref 0.0–0.2)

## 2019-10-31 LAB — COMPREHENSIVE METABOLIC PANEL
ALT: 26 U/L (ref 0–44)
AST: 27 U/L (ref 15–41)
Albumin: 4 g/dL (ref 3.5–5.0)
Alkaline Phosphatase: 54 U/L (ref 38–126)
Anion gap: 9 (ref 5–15)
BUN: 11 mg/dL (ref 6–20)
CO2: 23 mmol/L (ref 22–32)
Calcium: 9.4 mg/dL (ref 8.9–10.3)
Chloride: 103 mmol/L (ref 98–111)
Creatinine, Ser: 0.97 mg/dL (ref 0.61–1.24)
GFR calc Af Amer: >60 mL/min (ref >60)
GFR calc non Af Amer: >60 mL/min (ref >60)
Glucose, Bld: 124 mg/dL — ABNORMAL HIGH (ref 70–99)
Potassium: 4.7 mmol/L (ref 3.5–5.1)
Sodium: 135 mmol/L (ref 135–145)
Total Bilirubin: 0.6 mg/dL (ref 0.3–1.2)
Total Protein: 7.2 g/dL (ref 6.5–8.1)

## 2019-10-31 LAB — URINALYSIS, ROUTINE W REFLEX MICROSCOPIC
Bacteria, UA: NONE SEEN
Bilirubin Urine: NEGATIVE
Glucose, UA: 150 mg/dL — AB
Ketones, ur: NEGATIVE mg/dL
Leukocytes,Ua: NEGATIVE
Nitrite: NEGATIVE
Protein, ur: NEGATIVE mg/dL
Specific Gravity, Urine: 1.013 (ref 1.005–1.030)
pH: 6 (ref 5.0–8.0)

## 2019-10-31 LAB — SALICYLATE LEVEL: Salicylate Lvl: <7 mg/dL — ABNORMAL LOW (ref 7.0–30.0)

## 2019-10-31 LAB — ETHANOL: Alcohol, Ethyl (B): <10 mg/dL (ref <10)

## 2019-10-31 LAB — CBG MONITORING, ED: Glucose-Capillary: 115 mg/dL — ABNORMAL HIGH (ref 70–99)

## 2019-10-31 LAB — ACETAMINOPHEN LEVEL: Acetaminophen (Tylenol), Serum: <10 ug/mL — ABNORMAL LOW (ref 10–30)

## 2019-10-31 LAB — MAGNESIUM: Magnesium: 1.6 mg/dL — ABNORMAL LOW (ref 1.7–2.4)

## 2019-10-31 MED ORDER — HYDROCODONE-ACETAMINOPHEN 5-325 MG PO TABS: 1.0000 | ORAL_TABLET | Freq: Four times a day (QID) | ORAL | 0 refills | Status: DC | PRN

## 2019-10-31 MED ORDER — LEVETIRACETAM 500 MG PO TABS
500.0000 mg | ORAL_TABLET | Freq: Two times a day (BID) | ORAL | 1 refills | Status: DC
Start: 2019-10-31 — End: 2020-03-06

## 2019-10-31 MED ORDER — MAGNESIUM SULFATE 2 GM/50ML IV SOLN
2.0000 g | Freq: Once | INTRAVENOUS | Status: AC
  Administered 2019-10-31: 2 g via INTRAVENOUS
  Filled 2019-10-31: qty 50

## 2019-10-31 MED ORDER — LEVETIRACETAM IN NACL 1000 MG/100ML IV SOLN
1000.0000 mg | Freq: Once | INTRAVENOUS | Status: AC
  Administered 2019-10-31: 1000 mg via INTRAVENOUS
  Filled 2019-10-31: qty 100

## 2019-10-31 MED ORDER — ACETAMINOPHEN 325 MG PO TABS
650.0000 mg | ORAL_TABLET | Freq: Once | ORAL | Status: AC
  Administered 2019-10-31: 650 mg via ORAL
  Filled 2019-10-31: qty 2

## 2019-10-31 NOTE — ED Triage Notes (Addendum)
Pt brought to ED via EMS from home with c/o seizure that lasted approx 1-2 minutes per significant other. EMS reports pt seen at hospital recently for seizures, waiting for neuro appt. EMS states pt was postictal  on arrival to home, 5mg  Midazolam given. Pt lethargic on arrival to ED, oriented x4, VSS. Seizure pads, suction, and oxygen setup in room. Hx sleep apnea, CHF, HTN.  EMS v/s: 140/98 90 HR 98% on room air 171 CBG NSR

## 2019-10-31 NOTE — ED Provider Notes (Signed)
[name] EMERGENCY DEPARTMENT Provider Note   CSN: [id number] Arrival date & time: 10/31/19  0402     History Chief Complaint  Patient presents with  . Seizures    Mark Garcia is a 34 y.o. male with a history of hypertension, systolic congestive heart failure and seizure  who presents to the emergency department by EMS from home with a chief complaint of seizure.  Per the patient significant other, the patient had seizure-like activity with full body shaking and jerking that lasted for approximately 1 to 2 minutes prior to arrival that was witnessed by his significant other while they were laying in bed.  No tongue biting or urinary or fecal incontinence.  He did not fall out of bed.  EMS reports that the patient appeared postictal when they arrived at his home.  No seizure-like activity was noted.  He was given 5 mg of midazolam by EMS.  Patient was seen at [facility] health ER on 8/21 for new onset seizure where he had an unremarkable head CT and normal labs.  He was discharged home with Keppra.  He was seen by Dr. Anne Hahn with neurology in the office on 8/27.  He was compliant with his home dose of Keppra until he ran out of the medication on September 23 or 24.  In the ER, the patient has drowsy, but is arousable.  He has some confusion with following commands.  Reports that he has been sleeping well.  No recent illnesses.  He denies chest pain, shortness of breath, numbness, weakness, visual changes, neck pain, headaches, abdominal pain, vomiting, dizziness, lightheadedness, or syncope.  He denies tobacco use.  He reports infrequent, social alcohol use.  He denies illicit or recreational substance use.  Level 5 caveat secondary to altered mental status.  The history is provided by the patient and medical records. No language interpreter was used.       Past Medical History:  Diagnosis Date  . Hypertension     Patient Active Problem List   Diagnosis Date Noted   . Acute systolic CHF (congestive heart failure) (HCC) 02/18/2019  . Morbid obesity (HCC) 02/18/2019  . Congestive heart failure (CHF) (HCC) 02/17/2019  . Acute CHF (congestive heart failure) (HCC) 02/17/2019  . Hypertensive heart disease with heart failure [facility])     Past Surgical History:  Procedure Laterality Date  . 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 Scleroderma Mother   . Arthritis Mother   . Diabetes Maternal Grandmother     Social History   Tobacco Use  . Smoking status: Former Smoker    Quit date: 02/04/2012    Years since quitting: 7.7  . Smokeless tobacco: Never Used  Substance Use Topics  . Alcohol use: No  . Drug use: No    Home Medications Prior to Admission medications   Medication Sig Start Date End Date Taking? Authorizing Provider  carvedilol (COREG) 25 MG tablet TAKE 1 TABLET(25 MG) BY MOUTH TWICE DAILY WITH A MEAL Patient taking differently: Take 25 mg by mouth 2 (two) times daily with a meal.  08/02/19  Yes Cleaver, Thomasene Ripple, NP  sacubitril-valsartan (ENTRESTO) 97-103 MG Take 1 tablet by mouth 2 (two) times daily. 03/21/19  Yes Cleaver, Thomasene Ripple, NP  spironolactone (ALDACTONE) 25 MG tablet TAKE 1 TABLET(25 MG) BY MOUTH DAILY Patient taking differently: Take 25  mg by mouth daily.  06/15/19  Yes Ronney Asters, NP  levETIRAcetam (KEPPRA) 500 MG tablet Take 1 tablet (500 mg total) by mouth 2 (two) times daily. 10/31/19 11/30/19  Abenezer Odonell A, PA-C    Allergies    Patient has no known allergies.  Review of Systems   Review of Systems  Unable to perform ROS: Mental status change    Physical Exam Updated Vital Signs BP (!) 134/97   Pulse 87   Temp 98.1 F (36.7 C) (Oral)   Resp (!) 29   Ht 5\' 10"  (1.778 m)   Wt (!) 149 kg   SpO2 95%   BMI 47.13 kg/m   Physical Exam Vitals and nursing note reviewed.  Constitutional:      Appearance: He is  well-developed. He is obese. He is not diaphoretic.  HENT:     Head: Normocephalic.  Eyes:     Conjunctiva/sclera: Conjunctivae normal.  Cardiovascular:     Rate and Rhythm: Normal rate and regular rhythm.     Heart sounds: Normal heart sounds. No murmur heard.  No friction rub. No gallop.   Pulmonary:     Effort: Pulmonary effort is normal. No respiratory distress.     Breath sounds: Normal breath sounds. No stridor. No wheezing, rhonchi or rales.  Chest:     Chest wall: No tenderness.  Abdominal:     General: There is no distension.     Palpations: Abdomen is soft. There is no mass.     Tenderness: There is no abdominal tenderness. There is no right CVA tenderness, left CVA tenderness, guarding or rebound.     Hernia: No hernia is present.  Musculoskeletal:     Cervical back: Neck supple.     Right lower leg: No edema.     Left lower leg: No edema.  Skin:    General: Skin is warm and dry.  Neurological:     Comments: Drowsy, arouses to loud voice. GSC 14 for confusion. He is alert and oriented x4.  Moves all 4 extremities spontaneously.  5-5 strength against resistance of the bilateral upper and lower extremities.  Sensation is intact and equal throughout.  DTRs are 2+ and symmetric.  No clonus bilaterally.  No cogwheeling.  There is some confusion with following instructions for finger-to-nose, but no dysmetria is noted.  Heel-to-shin is normal.  Psychiatric:        Behavior: Behavior normal.     ED Results / Procedures / Treatments   Labs (all labs ordered are listed, but only abnormal results are displayed) Labs Reviewed  CBC WITH DIFFERENTIAL/PLATELET - Abnormal; Notable for the following components:      Result Value   Platelets 97 (*)    All other components within normal limits  MAGNESIUM - Abnormal; Notable for the following components:   Magnesium 1.6 (*)    All other components within normal limits  COMPREHENSIVE METABOLIC PANEL - Abnormal; Notable for the  following components:   Glucose, Bld 124 (*)    All other components within normal limits  ACETAMINOPHEN LEVEL - Abnormal; Notable for the following components:   Acetaminophen (Tylenol), Serum <10 (*)    All other components within normal limits  RAPID URINE DRUG SCREEN, HOSP PERFORMED - Abnormal; Notable for the following components:   Tetrahydrocannabinol POSITIVE (*)    All other components within normal limits  URINALYSIS, ROUTINE W REFLEX MICROSCOPIC - Abnormal; Notable for the following components:   Color, Urine COLORLESS (*)  Glucose, UA 150 (*)    Hgb urine dipstick SMALL (*)    All other components within normal limits  SALICYLATE LEVEL - Abnormal; Notable for the following components:   Salicylate Lvl <7.0 (*)    All other components within normal limits  CBG MONITORING, ED - Abnormal; Notable for the following components:   Glucose-Capillary 115 (*)    All other components within normal limits  ETHANOL  LEVETIRACETAM LEVEL    EKG None  Radiology DG Shoulder Right  Result Date: 10/31/2019 CLINICAL DATA:  Severe right shoulder pain. EXAM: RIGHT SHOULDER - 2+ VIEW COMPARISON:  CT 02/17/2019.  Chest x-ray 02/17/2019. FINDINGS: Fracture of the medial aspect of the right humeral head cannot be excluded. A reverse Hill-Sachs lesion cannot be excluded. Question has the patient had a prior posterior shoulder dislocation. Right shoulder MRI can be obtained for further evaluation. No evidence of dislocation or separation. IMPRESSION: Fracture of the medial aspect of the right humeral head cannot be excluded. A reverse Hill-Sachs lesion cannot be excluded. Question has the patient had a prior posterior dislocation. Right shoulder MRI can be obtained for further evaluation. Electronically Signed   By: Maisie Fushomas  Register   On: 10/31/2019 06:52    Procedures Procedures (including critical care time)  Medications Ordered in ED Medications  levETIRAcetam (KEPPRA) IVPB 1000 mg/100 mL  premix (0 mg Intravenous Stopped 10/31/19 0519)  magnesium sulfate IVPB 2 g 50 mL (0 g Intravenous Stopped 10/31/19 0712)  acetaminophen (TYLENOL) tablet 650 mg (650 mg Oral Given 10/31/19 [id number])    ED Course  I have reviewed the triage vital signs and the nursing notes.  Pertinent labs & imaging results that were available during my care of the patient were reviewed by me and considered in my medical decision making (see chart for details).  Clinical Course as of Oct 31 799  Mon Oct 31, 2019  [id number] Patient recheck.  He is awake, alert, and oriented x4.  He is at his baseline.  His only complaint is right shoulder pain, which the patient states has been present for the last month.  He does note that the pain began around the first time that he had his initial seizure.  He is unsure if he fell onto his right side during the episode.  Pain is sharp and worse with all movement of the right shoulder.  He has not been treating his pain for the last month and just assumed that the pain will go away on its own.  Reports that he has barely been able to use his right arm for the last month.  No history of previous injury or surgery.  He works as a Electrical engineersecurity guard.    [MM]  0755 X-ray of the right shoulder cannot exclude fracture of the right humeral head or reverse Hill-Sachs deformity.  Patient did report that his symptoms began about a month ago in the setting of seizure, which could explain the mechanism for a posterior dislocation.  Will order a CT for further evaluation given concern for acute fracture versus chronic posterior dislocation.  He may require consult orthopedics pending CT results.   [MM]    Clinical Course User Index [MM] Lysander Calixte, Coral ElseMia A, PA-C   MDM Rules/Calculators/A&P                          34 year old male with a history of hypertension, systolic congestive heart failure and seizure presenting from home after a  1 to 2-minute episode of witnessed seizure-like activity tonight with  generalized shaking and jerking.  Patient was seen at [facility] health ER on 8/21 for similar symptoms and was started on Keppra, which he has been compliant with until he ran out of the medication 3-4 days ago.  Patient was initially postictal versus drowsy from midazolam given in route by EMS on initial evaluation, but mental status significantly improved while he was observed in the ER.  Strongly suspect seizure was secondary to running out of his home Keppra approximately 3 to 4 days ago.  Labs are notable for mild hypomagnesemia, which was replenished in the ER.  He does also have a new thrombocytopenia of uncertain etiology.  He can follow-up with these labs with primary care.  A refill of his home Keppra has been called into his pharmacy.  On reevaluation, patient was alert and oriented x4.  He was able to provide a more extensive history.  His only complaint at this time was right upper arm pain that has been present for the last month.  X-ray of the right shoulder was obtained.  Discussed x-ray findings personally with radiology and Dr. Preston Fleeting.  Given that his symptoms began around the time he had his initial seizure, question chronic posterior dislocation versus acute fracture.  Patient care transferred to PA Petrucelli at the end of my shift to follow-up on his CT and possibly orthopedics pending CT results.   Patient presentation, ED course, and plan of care discussed with review of all pertinent labs and imaging. Please see his/her note for further details regarding further ED course and disposition.   Final Clinical Impression(s) / ED Diagnoses Final diagnoses:  Seizure (HCC)  Hypomagnesemia  Thrombocytopenia (HCC)    Rx / DC Orders ED Discharge Orders         Ordered    levETIRAcetam (KEPPRA) 500 MG tablet  2 times daily        10/31/19 0753           Barkley Boards, PA-C 10/31/19 0801    Dione Booze, MD 10/31/19 2233

## 2019-10-31 NOTE — Discharge Instructions (Addendum)
Thank you for allowing me to care for you today in the Emergency Department.   You most likely had a seizure today due to not taking your medication for the last few days.  I have called you in a 1 month prescription to your pharmacy.  Please follow-up with Dr. Anne Hahn to ensure that you get refills of this medication going forward because if you miss a few doses of the medication you may have another seizure.  If you are unable to follow-up with Dr. Anne Hahn we have sent a referral to our [facility] neurology group.  Your magnesium was slightly low today, but was replenished in the ER.  Please follow-up with primary care to have your magnesium levels rechecked in the next few weeks.  CT of your left shoulder shows that your humeral head is fracture.  We have placed you in a sling/immobilizer, please wear this at all times until you have followed up with orthopedics.  We are sending you home with a short course of Norco for pain control. -Norco -this is a narcotic/controlled substance medication that has potential addicting qualities.  We recommend that you take 1 tablet every 6 hours as needed for severe pain.  Do not drive or operate heavy machinery when taking this medicine as it can be sedating. Do not drink alcohol or take other sedating medications when taking this medicine for safety reasons.  Keep this out of reach of small children.  Please be aware this medicine has Tylenol in it (325 mg/tab) do not exceed the maximum dose of Tylenol in a day per over the counter recommendations should you decide to supplement with Tylenol over the counter.   We have prescribed you new medication(s) today. Discuss the medications prescribed today with your pharmacist as they can have adverse effects and interactions with your other medicines including over the counter and prescribed medications. Seek medical evaluation if you start to experience new or abnormal symptoms after taking one of these medicines, seek care  immediately if you start to experience difficulty breathing, feeling of your throat closing, facial swelling, or rash as these could be indications of a more serious allergic reaction  Please additionally follow-up with Dr. Eulah Pont with orthopedic surgery within 1 week for reevaluation. Follow with your primary care for general evaluation of your ED visit today.   Return to the ER for any new or worsening symptoms or any other concerns.

## 2019-10-31 NOTE — ED Provider Notes (Signed)
07:30: Assumed care of patient from Mark McDonald, PA-C at change of shift pending CT left shoulder. Please see prior provider note for full H&P.  Briefly patient is a 34 year old male who presented to the emergency department for evaluation of a seizure.  He had a seizure about 1 month ago during which he was seen in the emergency department at [facility] and discharged home on Keppra, unfortunately he ran out of this a few days ago and ultimately had a seizure.  He also is complaining of some left shoulder pain status post initial seizure 1 month ago.  Work-up overall reassuring in terms of laboratory testing- mag replaced.  He had a head CT during his last ED visit with a seizure which was unremarkable.  CT left shoulder was performed given persistent pain after 1 month which reveals  Comminuted fracture of the anterior aspect of the humeral head involving the lesser tuberosity and the articular surface. Major fracture fragment is inferior displaced by 10 mm. 4.6 mm of depression of the articular surface along the medial aspect of the humeral head  I discussed with Earney Hamburg, PA-C with orthopedic surgery, in agreement with plan for shoulder sling immobilizer and follow-up with orthopedic service.  Discharge paperwork prepared by prior provider in terms of seizure, he does remain at his baseline on my reassessment-they are having trouble with follow-up as the previous neurologist they saw does not accept their insurance, will send referral to alternative neurology group. I have added information to discharge instructions regarding his shoulder injury as well as sent in a prescription for a short course of pain medications. I discussed results, treatment plan, need for follow-up, and return precautions with the patient and his wife. Provided opportunity for questions, patient & his wife confirmed understanding and are in agreement with plan.     Results for orders placed or performed during the  hospital encounter of 10/31/19  CBC WITH DIFFERENTIAL  Result Value Ref Range   WBC 4.0 4.0 - 10.5 K/uL   RBC 5.07 4.22 - 5.81 MIL/uL   Hemoglobin 14.5 13.0 - 17.0 g/dL   HCT 60.1 39 - 52 %   MCV 88.8 80.0 - 100.0 fL   MCH 28.6 26.0 - 34.0 pg   MCHC 32.2 30.0 - 36.0 g/dL   RDW 09.3 23.5 - 57.3 %   Platelets 97 (L) 150 - 400 K/uL   nRBC 0.0 0.0 - 0.2 %   Neutrophils Relative % 68 %   Neutro Abs 2.8 1.7 - 7.7 K/uL   Lymphocytes Relative 19 %   Lymphs Abs 0.8 0.7 - 4.0 K/uL   Monocytes Relative 7 %   Monocytes Absolute 0.3 0 - 1 K/uL   Eosinophils Relative 5 %   Eosinophils Absolute 0.2 0 - 0 K/uL   Basophils Relative 1 %   Basophils Absolute 0.0 0 - 0 K/uL   Immature Granulocytes 0 %   Abs Immature Granulocytes 0.01 0.00 - 0.07 K/uL  Magnesium  Result Value Ref Range   Magnesium 1.6 (L) 1.7 - 2.4 mg/dL  Comprehensive metabolic panel  Result Value Ref Range   Sodium 135 135 - 145 mmol/L   Potassium 4.7 3.5 - 5.1 mmol/L   Chloride 103 98 - 111 mmol/L   CO2 23 22 - 32 mmol/L   Glucose, Bld 124 (H) 70 - 99 mg/dL   BUN 11 6 - 20 mg/dL   Creatinine, Ser 2.20 0.61 - 1.24 mg/dL   Calcium 9.4 8.9 - 10.3  mg/dL   Total Protein 7.2 6.5 - 8.1 g/dL   Albumin 4.0 3.5 - 5.0 g/dL   AST 27 15 - 41 U/L   ALT 26 0 - 44 U/L   Alkaline Phosphatase 54 38 - 126 U/L   Total Bilirubin 0.6 0.3 - 1.2 mg/dL   GFR calc non Af Amer >60 >60 mL/min   GFR calc Af Amer >60 >60 mL/min   Anion gap 9 5 - 15  Acetaminophen level  Result Value Ref Range   Acetaminophen (Tylenol), Serum <10 (L) 10 - 30 ug/mL  Ethanol/ETOH  Result Value Ref Range   Alcohol, Ethyl (B) <10 <10 mg/dL  Urine rapid drug screen (hosp performed)  Result Value Ref Range   Opiates NONE DETECTED NONE DETECTED   Cocaine NONE DETECTED NONE DETECTED   Benzodiazepines NONE DETECTED NONE DETECTED   Amphetamines NONE DETECTED NONE DETECTED   Tetrahydrocannabinol POSITIVE (A) NONE DETECTED   Barbiturates NONE DETECTED NONE DETECTED   Urinalysis, Routine w reflex microscopic  Result Value Ref Range   Color, Urine COLORLESS (A) YELLOW   APPearance CLEAR CLEAR   Specific Gravity, Urine 1.013 1.005 - 1.030   pH 6.0 5.0 - 8.0   Glucose, UA 150 (A) NEGATIVE mg/dL   Hgb urine dipstick SMALL (A) NEGATIVE   Bilirubin Urine NEGATIVE NEGATIVE   Ketones, ur NEGATIVE NEGATIVE mg/dL   Protein, ur NEGATIVE NEGATIVE mg/dL   Nitrite NEGATIVE NEGATIVE   Leukocytes,Ua NEGATIVE NEGATIVE   WBC, UA 0-5 0 - 5 WBC/hpf   Bacteria, UA NONE SEEN NONE SEEN  Salicylate level  Result Value Ref Range   Salicylate Lvl <7.0 (L) 7.0 - 30.0 mg/dL  CBG monitoring, ED  Result Value Ref Range   Glucose-Capillary 115 (H) 70 - 99 mg/dL   DG Shoulder Right  Result Date: 10/31/2019 CLINICAL DATA:  Severe right shoulder pain. EXAM: RIGHT SHOULDER - 2+ VIEW COMPARISON:  CT 02/17/2019.  Chest x-ray 02/17/2019. FINDINGS: Fracture of the medial aspect of the right humeral head cannot be excluded. A reverse Hill-Sachs lesion cannot be excluded. Question has the patient had a prior posterior shoulder dislocation. Right shoulder MRI can be obtained for further evaluation. No evidence of dislocation or separation. IMPRESSION: Fracture of the medial aspect of the right humeral head cannot be excluded. A reverse Hill-Sachs lesion cannot be excluded. Question has the patient had a prior posterior dislocation. Right shoulder MRI can be obtained for further evaluation. Electronically Signed   By: Maisie Fus  Register   On: 10/31/2019 06:52   CT Shoulder Right Wo Contrast  Result Date: 10/31/2019 CLINICAL DATA:  Shoulder trauma. Right shoulder pain for 1 month the began after seizure. EXAM: CT OF THE UPPER RIGHT EXTREMITY WITHOUT CONTRAST TECHNIQUE: Multidetector CT imaging of the upper right extremity was performed according to the standard protocol. COMPARISON:  None. FINDINGS: Bones/Joint/Cartilage Comminuted fracture of the anterior aspect of the humeral head involving  the lesser tuberosity and the articular surface. Major fracture fragment is inferior displaced by 10 mm. 4.6 mm of depression of the articular surface along the medial aspect of the humeral head. No other acute fracture or dislocation. No aggressive osseous lesion. Moderate joint effusion. Normal acromioclavicular joint. 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 Ligaments Ligaments are suboptimally evaluated by CT. Muscles and Tendons Muscles are normal.  No muscle atrophy. Soft tissue No fluid collection or hematoma.  No soft tissue mass. IMPRESSION: Comminuted fracture of the anterior aspect of the humeral head involving the lesser  tuberosity and the articular surface. Major fracture fragment is inferior displaced by 10 mm. 4.6 mm of depression of the articular surface along the medial aspect of the humeral head. Electronically Signed   By: Elige Ko   On: 10/31/2019 08:39         Manolo Bosket, Pleas Koch, PA-C 10/31/19 1014    Curatolo, Adam, DO 10/31/19 1112

## 2019-10-31 NOTE — ED Notes (Signed)
Patient transported to CT 

## 2019-11-04 LAB — LEVETIRACETAM LEVEL: Levetiracetam Lvl: <1 ug/mL — ABNORMAL LOW (ref 10.0–40.0)

## 2019-12-20 ENCOUNTER — Other Ambulatory Visit: Payer: Self-pay | Admitting: General Practice

## 2020-01-05 ENCOUNTER — Other Ambulatory Visit: Payer: Self-pay | Admitting: General Practice

## 2020-01-11 NOTE — Telephone Encounter (Signed)
Called and s/w Fantavia Spruil(Sgo)(DPR)and reminded her to have pt come in and sign Pt Assistance forms.

## 2020-01-29 NOTE — Progress Notes (Deleted)
Cardiology Office Note   Date:  01/29/2020   ID:  Mark Garcia, DOB November 11, 1985, MRN [id number]  PCP:  Patient, No Pcp Per  Cardiologist:   Georgina Krist [place], MD   No chief complaint on file.     History of Present Illness: Mark Garcia is a 34 y.o. male who presents for follow up CHF and hypertensive heart disease. He was admitted in January with Acute systolic CHF.  His echocardiogram from 02/18/2019 showed an ejection fraction of 25 to 30%, severely decreased left ventricular function, moderate LVH, global hypokinesis, severely dilated left ventricle, grade 2 diastolic dysfunction, elevated left atrial pressures, and a dilated left atrium. A  chest x-ray showed interstitial pulmonary edema and cardiomegaly. BP was greater than 220 systolic.  He had not been taking his medication due to lack of insurance however, now he has insurance through his new job.  He was given IV Lasix and his breathing improved. It was felt his findings were consistent with hypertensive heart disease. His losartan was switched to Entresto 24-26 mg twice daily, spironolactone 12.5 daily, and carvedilol 12.5 mg twice daily were added. He has had several outpatient follow up visits with additional titration of medication. He has been compliant with therapy. He works as a Electrical engineer. Repeat Echo done 06/14/19 and was no change with persistent EF 25-30%.  Sleep study was recommended but never done.   He was seen in the ED twice this fall for seizures. Treated with Keppra.   He reports that he is feeling so much better. Denies any chest pain or dyspnea. He is more energetic and more active. Does a lot of walking on the job. Is much more careful with diet. His wife is now 7 months pregnant so he is really motivated to take care of himself. Wife does note some apnea and snoring at night.   Past Medical History:  Diagnosis Date  . Hypertension     Past Surgical History:  Procedure Laterality Date  . 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 carvedilol (COREG) 25 MG tablet TAKE 1 TABLET(25 MG) BY MOUTH TWICE DAILY WITH A MEAL. 30 tablet 6  . HYDROcodone-acetaminophen (NORCO/VICODIN) 5-325 MG tablet Take 1 tablet by mouth every 6 (six) hours as needed. 8 tablet 0  . levETIRAcetam (KEPPRA) 500 MG tablet Take 1 tablet (500 mg total) by mouth 2 (two) times daily. 60 tablet 1  . sacubitril-valsartan (ENTRESTO) 97-103 MG Take 1 tablet by mouth 2 (two) times daily. 60 tablet 3  . spironolactone (ALDACTONE) 25 MG tablet TAKE 1 TABLET(25 MG) BY MOUTH DAILY 30 tablet 7   No current facility-administered medications for this visit.    Allergies:   Patient has no known allergies.    Social History:  The patient  reports that he quit smoking about 7 years ago. He has never used smokeless tobacco. He reports that he does not drink alcohol and does not use drugs.   Family History:  The patient's family history includes Arthritis in his mother; Diabetes in his maternal grandmother; Scleroderma in his mother.    ROS:  Please see the history of present illness.   Otherwise, review of systems are positive for none.   All other systems are reviewed and negative.    PHYSICAL EXAM: VS:  There were no vitals taken for this visit. , BMI There is  no height or weight on file to calculate BMI. GEN: Well nourished, obese, in no acute distress  HEENT: normal  Neck: no JVD, carotid bruits, or masses Cardiac: RRR; no murmurs, rubs, or gallops,no edema  Respiratory:  clear to auscultation bilaterally, normal work of breathing GI: soft, nontender, nondistended, + BS MS: no deformity or atrophy  Skin: warm and dry, no rash Neuro:  Strength and sensation are intact Psych: euthymic mood, full affect   EKG:  EKG is not ordered today. The ekg ordered today demonstrates N/A   Recent Labs: 02/17/2019: B Natriuretic  Peptide 574.8 02/18/2019: TSH 2.559 10/31/2019: ALT 26; BUN 11; Creatinine, Ser 0.97; Hemoglobin 14.5; Magnesium 1.6; Platelets 97; Potassium 4.7; Sodium 135    Lipid Panel    Component Value Date/Time   CHOL 144 02/18/2019 0407   TRIG 65 02/18/2019 0407   HDL 42 02/18/2019 0407   CHOLHDL 3.4 02/18/2019 0407   VLDL 13 02/18/2019 0407   LDLCALC 89 02/18/2019 0407      Wt Readings from Last 3 Encounters:  10/31/19 (!) 328 lb 7.8 oz (149 kg)  07/18/19 (!) 330 lb 3.2 oz (149.8 kg)  04/19/19 (!) 333 lb (151 kg)      Other studies Reviewed: Additional studies/ records that were reviewed today include:  Echocardiogram 02/18/2019 IMPRESSIONS   1. Left ventricular ejection fraction, by visual estimation, is 25 to 30%. The left ventricle has severely decreased function. There is moderately increased left ventricular hypertrophy. 2. The left ventricle demonstrates global hypokinesis. 3. Severely dilated left ventricular internal cavity size. 4. Left ventricular diastolic parameters are consistent with Grade II diastolic dysfunction (pseudonormalization). 5. Elevated left atrial pressure. 6. Global right ventricle has normal systolic function.The right ventricular size is normal. 7. Left atrial size was mildly dilated. 8. Right atrial size was normal. 9. The mitral valve is normal in structure. Mild mitral valve regurgitation. 10. The tricuspid valve is normal in structure. 11. The aortic valve is normal in structure. Aortic valve regurgitation is not visualized. 12. The pulmonic valve was not well visualized. Pulmonic valve regurgitation is not visualized. 13. The inferior vena cava is normal in size with greater than 50% respiratory variability, suggesting right atrial pressure of 3 mmHg. 14. TR signal is inadequate for assessing pulmonary artery systolic pressure.  Echo 06/14/19: IMPRESSIONS    1. No LV thrombus on contrast imaging. Left ventricular ejection   fraction, by estimation, is 25 to 30%. The left ventricle has severely  decreased function. The left ventricle demonstrates global hypokinesis.  The left ventricular internal cavity size was  moderately to severely dilated. Left ventricular diastolic parameters are  consistent with Grade I diastolic dysfunction (impaired relaxation).  2. Right ventricular systolic function is mildly reduced. The right  ventricular size is normal. Tricuspid regurgitation signal is inadequate  for assessing PA pressure.  3. Left atrial size was mildly dilated.  4. The mitral valve is grossly normal. Mild mitral valve regurgitation.  No evidence of mitral stenosis.  5. The aortic valve is tricuspid. Aortic valve regurgitation is not  visualized. No aortic stenosis is present.   Comparison(s): No significant change from prior study.    ASSESSMENT AND PLAN:  1.  Acute on chronic systolic CHF. EF is still 25-30% despite optimal medical therapy but clinically he is doing much better and is class 1-2. Will continue current therapy. Encourage weight loss. Continue lifestyle modification. 2. Morbid obesity. Some symptoms of sleep apnea. Will arrange for sleep study 3. HTN with  hypertensive heart disease. BP is well controlled.   Current medicines are reviewed at length with the patient today.  The patient does not have concerns regarding medicines.  The following changes have been made:  no change  Labs/ tests ordered today include:   No orders of the defined types were placed in this encounter.    Disposition:   FU with me in 6 months  Signed, Maurice Fotheringham [place], MD  01/29/2020 8:12 AM    [facility] [address] [place], [place], [address] Phone [phone], Fax [phone]

## 2020-01-30 ENCOUNTER — Ambulatory Visit: Payer: 59 | Admitting: Cardiology

## 2020-03-05 ENCOUNTER — Emergency Department (HOSPITAL_COMMUNITY)
Admission: EM | Admit: 2020-03-05 | Discharge: 2020-03-06 | Disposition: A | Discharge status: Home / Self Care | Payer: 59 | Attending: Emergency Medicine | Admitting: Emergency Medicine

## 2020-03-05 DIAGNOSIS — R569 Unspecified convulsions: Secondary | ICD-10-CM | POA: Insufficient documentation

## 2020-03-05 DIAGNOSIS — I110 Hypertensive heart disease with heart failure: Secondary | ICD-10-CM | POA: Insufficient documentation

## 2020-03-05 DIAGNOSIS — I5021 Acute systolic (congestive) heart failure: Secondary | ICD-10-CM | POA: Insufficient documentation

## 2020-03-05 DIAGNOSIS — Z87891 Personal history of nicotine dependence: Secondary | ICD-10-CM | POA: Insufficient documentation

## 2020-03-05 DIAGNOSIS — Z79899 Other long term (current) drug therapy: Secondary | ICD-10-CM | POA: Insufficient documentation

## 2020-03-05 MED ORDER — LEVETIRACETAM IN NACL 1000 MG/100ML IV SOLN
1000.0000 mg | Freq: Once | INTRAVENOUS | Status: AC
  Administered 2020-03-05: 1000 mg via INTRAVENOUS
  Filled 2020-03-05: qty 100

## 2020-03-05 NOTE — ED Provider Notes (Signed)
[facility] EMERGENCY DEPARTMENT Provider Note   CSN: [id number] Arrival date & time: 03/05/20  2244     History Chief Complaint  Patient presents with   Seizures    Mark Garcia is a 35 y.o. male.  The history is provided by the patient and medical records.  Seizures   35 y.o. M with hx of HTN, CHF, obesity, seizures, presenting to the ED following seizures.  Reportedly, he had 2-3 min tonic clonic seizure at home witnessed by wife.  Seizure stopped spontaneously but was post-ictal and agitated, given 5mg  versed at home and then another 2.5mg .  Patient sleeping on arrival.  No visible signs of head trauma.  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 Authorizing Provider  carvedilol (COREG) 25 MG tablet TAKE 1 TABLET(25 MG) BY MOUTH TWICE DAILY WITH A MEAL. 01/05/20   14/2/21, Peter M, MD  HYDROcodone-acetaminophen (NORCO/VICODIN) 5-325 MG tablet Take 1 tablet by mouth every 6 (six) hours as needed. 10/31/19   Petrucelli, Samantha R, PA-C  levETIRAcetam  (KEPPRA) 500 MG tablet Take 1 tablet (500 mg total) by mouth 2 (two) times daily. 10/31/19 11/30/19  McDonald, Mia A, PA-C  sacubitril-valsartan (ENTRESTO) 97-103 MG Take 1 tablet by mouth 2 (two) times daily. 03/21/19   03/23/19, NP  spironolactone (ALDACTONE) 25 MG tablet TAKE 1 TABLET(25 MG) BY MOUTH DAILY 12/20/19   12/22/19, Peter M, MD    Allergies    Patient has no known allergies.  Review of Systems   Review of Systems  Neurological: Positive for seizures.  All other systems reviewed and are negative.   Physical Exam Updated Vital Signs BP (!) 144/83 (BP Location: Right Arm)    Pulse 99    Temp (!) 97.5 F (36.4 C) (Oral)    Resp 14    SpO2 96%   Physical Exam Vitals and nursing note reviewed.  Constitutional:      Appearance: He is well-developed and well-nourished. He is obese.     Comments: Morbidly obese, post-ictal  HENT:     Head: Normocephalic and atraumatic.     Comments: Visible head trauma    Mouth/Throat:     Mouth: Oropharynx is clear and moist.     Comments: Abrasion noted to distal tongue Eyes:     Extraocular Movements: EOM normal.     Conjunctiva/sclera: Conjunctivae normal.     Pupils: Pupils are equal, round, and reactive to  light.  Cardiovascular:     Rate and Rhythm: Normal rate and regular rhythm.     Heart sounds: Normal heart sounds.  Pulmonary:     Effort: Pulmonary effort is normal. No respiratory distress.     Breath sounds: Normal breath sounds. No rhonchi.  Abdominal:     General: Bowel sounds are normal.     Palpations: Abdomen is soft.     Tenderness: There is no abdominal tenderness. There is no rebound.  Musculoskeletal:        General: Normal range of motion.     Cervical back: Normal range of motion.  Skin:    General: Skin is warm and dry.  Neurological:     Comments: Drowsy appearing, snoring  Psychiatric:        Mood and Affect: Mood and affect normal.     ED Results / Procedures / Treatments   Labs (all labs  ordered are listed, but only abnormal results are displayed) Labs Reviewed  CBC WITH DIFFERENTIAL/PLATELET - Abnormal; Notable for the following components:      Result Value   Hemoglobin 12.9 (*)    HCT 38.3 (*)    All other components within normal limits  COMPREHENSIVE METABOLIC PANEL - Abnormal; Notable for the following components:   Glucose, Bld 117 (*)    All other components within normal limits  RAPID URINE DRUG SCREEN, HOSP PERFORMED - Abnormal; Notable for the following components:   Benzodiazepines POSITIVE (*)    Tetrahydrocannabinol POSITIVE (*)    All other components within normal limits  ETHANOL    EKG EKG Interpretation  Date/Time:  Monday March 05 2020 22:56:55 EST Ventricular Rate:  95 PR Interval:    QRS Duration: 102 QT Interval:  351 QTC Calculation: 442 R Axis:   -21 Text Interpretation: Sinus rhythm Prolonged PR interval Left atrial enlargement Borderline left axis deviation Interpretation limited secondary to artifact Confirmed by Zadie Rhine ([id number]) on 03/05/2020 11:14:53 PM   Radiology No results found.  Procedures Procedures   Medications Ordered in ED Medications  levETIRAcetam (KEPPRA) IVPB 1000 mg/100 mL premix (0 mg Intravenous Stopped 03/06/20 0024)    ED Course  I have reviewed the triage vital signs and the nursing notes.  Pertinent labs & imaging results that were available during my care of the patient were reviewed by me and considered in my medical decision making (see chart for details).    MDM Rules/Calculators/A&P  35 year old male presenting to the ED following 2 to 3-minute tonic-clonic seizure which resolved upon EMS arrival.  He was postictal but became combative and was given Versed 5 mg, followed by 2.5 mg dose.  He is drowsy on arrival but hemodynamically stable.  Does have history of seizure disorder, currently on Keppra, uncertain as to compliance.  EKG without any acute findings.  Labs pending.  Given dose of IV  Keppra.  Labs reassuring.  UDS is positive for benzodiazepines and THC.  Patient has started to arouse some but falling asleep quickly in mid conversation.  He is spontaneously moving his arms and legs.  Suspect prolonged drowsy.  Due to the Versed.  We will continue to monitor.  4:55 AM Patient now awake/alert.  He is able to answer questions and follow commands without issue.  He is moving extremities well, states he just feels tired.  He reports he has been complaint with this keppra at home.  Neurologically intact at this time.  Feel he is stable for discharge home.  I  have sent refill of his keppra to pharmacy as he was not sure how much was left at home.  He will need to follow-up with his neurologist.  Return here for any new/acute changes.  I have called and spoke with significant other and reviewed results along with need for neurology follow-up.  She will come pick him up.  Final Clinical Impression(s) / ED Diagnoses Final diagnoses:  Seizure [facility])    Rx / DC Orders ED Discharge Orders         Ordered    levETIRAcetam (KEPPRA) 500 MG tablet  2 times daily        03/06/20 0536           Garlon Hatchet, PA-C 03/06/20 4627    Zadie Rhine, MD 03/06/20 [phone]

## 2020-03-05 NOTE — ED Triage Notes (Signed)
Pt Bib [facility] from home after pt had tonic clonic seizure x 2-46min per wife. Pt received 5mg  versed by EMS d/t pt being aggitated during post ictal state. Pt then received another 2.5mg  of versed by EMS once in the ambulance.

## 2020-03-06 ENCOUNTER — Emergency Department (HOSPITAL_COMMUNITY): Payer: Self-pay

## 2020-03-06 ENCOUNTER — Other Ambulatory Visit: Payer: Self-pay

## 2020-03-06 ENCOUNTER — Emergency Department (HOSPITAL_COMMUNITY)
Admission: EM | Admit: 2020-03-06 | Discharge: 2020-03-06 | Disposition: A | Discharge status: Home / Self Care | Payer: Self-pay | Attending: Emergency Medicine | Admitting: Emergency Medicine

## 2020-03-06 ENCOUNTER — Encounter (HOSPITAL_COMMUNITY): Payer: Self-pay

## 2020-03-06 DIAGNOSIS — R110 Nausea: Secondary | ICD-10-CM | POA: Insufficient documentation

## 2020-03-06 DIAGNOSIS — Z79899 Other long term (current) drug therapy: Secondary | ICD-10-CM | POA: Insufficient documentation

## 2020-03-06 DIAGNOSIS — M25511 Pain in right shoulder: Secondary | ICD-10-CM | POA: Insufficient documentation

## 2020-03-06 DIAGNOSIS — Z87891 Personal history of nicotine dependence: Secondary | ICD-10-CM | POA: Insufficient documentation

## 2020-03-06 DIAGNOSIS — I110 Hypertensive heart disease with heart failure: Secondary | ICD-10-CM | POA: Insufficient documentation

## 2020-03-06 DIAGNOSIS — I5021 Acute systolic (congestive) heart failure: Secondary | ICD-10-CM | POA: Insufficient documentation

## 2020-03-06 DIAGNOSIS — R569 Unspecified convulsions: Secondary | ICD-10-CM | POA: Insufficient documentation

## 2020-03-06 DIAGNOSIS — G40909 Epilepsy, unspecified, not intractable, without status epilepticus: Secondary | ICD-10-CM

## 2020-03-06 HISTORY — DX: Unspecified convulsions: R56.9

## 2020-03-06 HISTORY — DX: Heart failure, unspecified: I50.9

## 2020-03-06 LAB — BASIC METABOLIC PANEL
Anion gap: 15 (ref 5–15)
BUN: 8 mg/dL (ref 6–20)
CO2: 19 mmol/L — ABNORMAL LOW (ref 22–32)
Calcium: 9.2 mg/dL (ref 8.9–10.3)
Chloride: 100 mmol/L (ref 98–111)
Creatinine, Ser: 0.98 mg/dL (ref 0.61–1.24)
GFR, Estimated: >60 mL/min (ref >60)
Glucose, Bld: 144 mg/dL — ABNORMAL HIGH (ref 70–99)
Potassium: 3.8 mmol/L (ref 3.5–5.1)
Sodium: 134 mmol/L — ABNORMAL LOW (ref 135–145)

## 2020-03-06 LAB — CBC WITH DIFFERENTIAL/PLATELET
Abs Immature Granulocytes: 0.03 10*3/uL (ref 0.00–0.07)
Basophils Absolute: 0 10*3/uL (ref 0.0–0.1)
Basophils Relative: 0 %
Eosinophils Absolute: 0.2 10*3/uL (ref 0.0–0.5)
Eosinophils Relative: 2 %
HCT: 38.3 % — ABNORMAL LOW (ref 39.0–52.0)
Hemoglobin: 12.9 g/dL — ABNORMAL LOW (ref 13.0–17.0)
Immature Granulocytes: 0 %
Lymphocytes Relative: 11 %
Lymphs Abs: 0.9 10*3/uL (ref 0.7–4.0)
MCH: 29.1 pg (ref 26.0–34.0)
MCHC: 33.7 g/dL (ref 30.0–36.0)
MCV: 86.5 fL (ref 80.0–100.0)
Monocytes Absolute: 0.7 10*3/uL (ref 0.1–1.0)
Monocytes Relative: 8 %
Neutro Abs: 6.5 10*3/uL (ref 1.7–7.7)
Neutrophils Relative %: 79 %
Platelets: 188 10*3/uL (ref 150–400)
RBC: 4.43 MIL/uL (ref 4.22–5.81)
RDW: 12.6 % (ref 11.5–15.5)
WBC: 8.4 10*3/uL (ref 4.0–10.5)
nRBC: 0 % (ref 0.0–0.2)

## 2020-03-06 LAB — CBC
HCT: 39.7 % (ref 39.0–52.0)
Hemoglobin: 13.2 g/dL (ref 13.0–17.0)
MCH: 28.9 pg (ref 26.0–34.0)
MCHC: 33.2 g/dL (ref 30.0–36.0)
MCV: 87.1 fL (ref 80.0–100.0)
Platelets: 173 10*3/uL (ref 150–400)
RBC: 4.56 MIL/uL (ref 4.22–5.81)
RDW: 12.6 % (ref 11.5–15.5)
WBC: 8.7 10*3/uL (ref 4.0–10.5)
nRBC: 0 % (ref 0.0–0.2)

## 2020-03-06 LAB — CBG MONITORING, ED: Glucose-Capillary: 152 mg/dL — ABNORMAL HIGH (ref 70–99)

## 2020-03-06 LAB — RAPID URINE DRUG SCREEN, HOSP PERFORMED
Amphetamines: NOT DETECTED
Amphetamines: NOT DETECTED
Barbiturates: NOT DETECTED
Barbiturates: NOT DETECTED
Benzodiazepines: POSITIVE — AB
Benzodiazepines: POSITIVE — AB
Cocaine: NOT DETECTED
Cocaine: NOT DETECTED
Opiates: NOT DETECTED
Opiates: NOT DETECTED
Tetrahydrocannabinol: POSITIVE — AB
Tetrahydrocannabinol: POSITIVE — AB

## 2020-03-06 LAB — COMPREHENSIVE METABOLIC PANEL
ALT: 23 U/L (ref 0–44)
AST: 26 U/L (ref 15–41)
Albumin: 4 g/dL (ref 3.5–5.0)
Alkaline Phosphatase: 45 U/L (ref 38–126)
Anion gap: 7 (ref 5–15)
BUN: 12 mg/dL (ref 6–20)
CO2: 28 mmol/L (ref 22–32)
Calcium: 9.2 mg/dL (ref 8.9–10.3)
Chloride: 101 mmol/L (ref 98–111)
Creatinine, Ser: 0.93 mg/dL (ref 0.61–1.24)
GFR, Estimated: >60 mL/min (ref >60)
Glucose, Bld: 117 mg/dL — ABNORMAL HIGH (ref 70–99)
Potassium: 3.8 mmol/L (ref 3.5–5.1)
Sodium: 136 mmol/L (ref 135–145)
Total Bilirubin: 0.5 mg/dL (ref 0.3–1.2)
Total Protein: 7.6 g/dL (ref 6.5–8.1)

## 2020-03-06 LAB — ETHANOL: Alcohol, Ethyl (B): <10 mg/dL (ref <10)

## 2020-03-06 MED ORDER — LEVETIRACETAM 500 MG PO TABS: 500.0000 mg | ORAL_TABLET | Freq: Two times a day (BID) | ORAL | 0 refills | Status: DC

## 2020-03-06 MED ORDER — LEVETIRACETAM IN NACL 1000 MG/100ML IV SOLN
1000.0000 mg | Freq: Once | INTRAVENOUS | Status: AC
  Administered 2020-03-06: 1000 mg via INTRAVENOUS
  Filled 2020-03-06: qty 100

## 2020-03-06 MED ORDER — LEVETIRACETAM 500 MG PO TABS: 1000.0000 mg | ORAL_TABLET | Freq: Two times a day (BID) | ORAL | 0 refills | Status: DC

## 2020-03-06 MED ORDER — LEVETIRACETAM 500 MG/5ML IV SOLN: 2000.0000 mg | Freq: Once | INTRAVENOUS | Status: DC

## 2020-03-06 NOTE — ED Triage Notes (Signed)
Pt arrived via [facility] from home for a witnessed seizure that last a little over a minute per EMS. Pt also had a seizure last night. Per EMS pt was incontinent of urine, post tictal and was combative. Pt is now A&Ox4. Pt vomited.

## 2020-03-06 NOTE — ED Notes (Signed)
Updated spouse on pt status.  She asked for [place] to give her a little heads up when we think he might be discharged so that she can get herself and baby together to come get him. Number is in contacts.

## 2020-03-06 NOTE — Discharge Instructions (Addendum)
Refill of keppra has been sent to pharmacy.  Take this as directed, try not to miss any doses. Follow-up with your neurologist. Return here for any new/acute changes.

## 2020-03-06 NOTE — Discharge Instructions (Signed)
Please schedule a close follow-up with your primary neurologist.  Verify that a MRI has been completed.  If this has not been completed, you should have an MRI of your brain completed as part of your seizure work-up.    Please increase your Keppra dosing and take 1000 mg Keppra twice daily.  If you have any recurrent seizure episodes, return to ER for reassessment.

## 2020-03-07 NOTE — ED Provider Notes (Signed)
[name] EMERGENCY DEPARTMENT Provider Note   CSN: [id number] Arrival date & time: 03/06/20  1051     History Chief Complaint  Patient presents with  . Seizures    Mark Garcia is a 35 y.o. male.  Heart failure, hypertension, seizure history.  Came to ER after patient had another witnessed seizure.  Per report seizure episode lasted around 1 minute.  Incontinent of urine, initially confused and then somewhat combative.  Patient states he feels mildly nauseous and has some right shoulder pain but otherwise denies any acute medical complaints, does not recall seizure episode.  Patient states that he fractured his shoulder a while ago.  Pain is currently mild, worse with movement, dull and achy.  Per review of chart, patient was seen in our ER last night for a seizure episode, given dose of IV Keppra and discharged home.  Per review of chart, patient is evaluated by a [facility] neurologist, EEG was negative outpatient.  Recommended 500 mg Keppra twice daily on last clinic note.  CT head at [facility] ER visit last fall was negative.  HPI     Past Medical History:  Diagnosis Date  . CHF (congestive heart failure) (HCC)   . Hypertension   . Seizures [facility])     Patient Active Problem List   Diagnosis Date Noted  . Acute systolic CHF (congestive heart failure) (HCC) 02/18/2019  . Morbid obesity (HCC) 02/18/2019  . Congestive heart failure (CHF) (HCC) 02/17/2019  . Acute CHF (congestive heart failure) (HCC) 02/17/2019  . Hypertensive heart disease with heart failure [facility])     Past Surgical History:  Procedure Laterality Date  . 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 Scleroderma Mother   . Arthritis Mother   . Diabetes Maternal Grandmother     Social History   Tobacco Use  . Smoking status: Former Smoker    Quit date: 02/04/2012    Years since quitting: 8.0  .  Smokeless tobacco: Never Used  Substance Use Topics  . Alcohol use: No  . Drug use: No    Home Medications Prior to Admission medications   Medication Sig Start Date End Date Taking? Authorizing Provider  carvedilol (COREG) 25 MG tablet TAKE 1 TABLET(25 MG) BY MOUTH TWICE DAILY WITH A MEAL. 01/05/20   [place], Peter M, MD  HYDROcodone-acetaminophen (NORCO/VICODIN) 5-325 MG tablet Take 1 tablet by mouth every 6 (six) hours as needed. 10/31/19   Petrucelli, Samantha R, PA-C  levETIRAcetam (KEPPRA) 500 MG tablet Take 2 tablets (1,000 mg total) by mouth 2 (two) times daily. 03/06/20   Milagros Loll, MD  sacubitril-valsartan (ENTRESTO) 97-103 MG Take 1 tablet by mouth 2 (two) times daily. 03/21/19   Ronney Asters, NP  spironolactone (ALDACTONE) 25 MG tablet TAKE 1 TABLET(25 MG) BY MOUTH DAILY 12/20/19   [place], Peter M, MD    Allergies    Patient has no known allergies.  Review of Systems   Review of Systems  Constitutional: Negative for chills and fever.  HENT: Negative for ear pain and sore throat.   Eyes: Negative for pain and visual disturbance.  Respiratory: Negative for cough and shortness of breath.   Cardiovascular: Negative for chest pain and palpitations.  Gastrointestinal: Negative for abdominal pain and vomiting.  Genitourinary: Negative for dysuria and hematuria.  Musculoskeletal: Negative for arthralgias and back pain.  Skin: Negative for color change and rash.  Neurological: Positive for seizures. Negative for syncope.  All other systems reviewed and are negative.   Physical Exam Updated Vital Signs BP (!) 144/98   Pulse 83   Temp 97.8 F (36.6 C) (Oral)   Resp 18   Ht 5\' 10"  (1.778 m)   Wt (!) 149 kg   SpO2 98%   BMI 47.13 kg/m   Physical Exam Vitals and nursing note reviewed.  Constitutional:      Appearance: He is well-developed and well-nourished.  HENT:     Head: Normocephalic and atraumatic.  Eyes:     Conjunctiva/sclera: Conjunctivae normal.   Cardiovascular:     Rate and Rhythm: Normal rate and regular rhythm.     Heart sounds: No murmur heard.   Pulmonary:     Effort: Pulmonary effort is normal. No respiratory distress.     Breath sounds: Normal breath sounds.  Abdominal:     Palpations: Abdomen is soft.     Tenderness: There is no abdominal tenderness.  Musculoskeletal:        General: No edema.     Cervical back: Neck supple.     Comments: Some tenderness over the right shoulder but no deformity, normal joint range of motion, normal radial pulse  Skin:    General: Skin is warm and dry.  Neurological:     Mental Status: He is alert.  Psychiatric:        Mood and Affect: Mood and affect normal.     ED Results / Procedures / Treatments   Labs (all labs ordered are listed, but only abnormal results are displayed) Labs Reviewed  RAPID URINE DRUG SCREEN, HOSP PERFORMED - Abnormal; Notable for the following components:      Result Value   Benzodiazepines POSITIVE (*)    Tetrahydrocannabinol POSITIVE (*)    All other components within normal limits  BASIC METABOLIC PANEL - Abnormal; Notable for the following components:   Sodium 134 (*)    CO2 19 (*)    Glucose, Bld 144 (*)    All other components within normal limits  CBG MONITORING, ED - Abnormal; Notable for the following components:   Glucose-Capillary 152 (*)    All other components within normal limits  CBC  LEVETIRACETAM LEVEL    EKG None  Radiology DG Shoulder Right  Result Date: 03/06/2020 CLINICAL DATA:  Right shoulder pain after seizure. Concern for dislocation. Hill-Sachs deformity. EXAM: RIGHT SHOULDER - 2+ VIEW COMPARISON:  Plain film and CT09/27/2021 FINDINGS: AP and scapular views. No dislocation. Osseous irregularity involving the medial humeral head is at the site of fracture on 10/31/2019, and likely due to healed fracture. No convincing evidence of acute fracture. Os acromiale as detailed on CT. IMPRESSION: Remote humeral head fracture,  without convincing evidence of acute superimposed injury. No dislocation. Electronically Signed   By: 11/02/2019 M.D.   On: 03/06/2020 12:48    Procedures Procedures   Medications Ordered in ED Medications  levETIRAcetam (KEPPRA) IVPB 1000 mg/100 mL premix (0 mg Intravenous Stopped 03/06/20 1255)    And  levETIRAcetam (KEPPRA) IVPB 1000 mg/100 mL premix (0 mg Intravenous Stopped 03/06/20 1255)    ED Course  I have reviewed the triage vital signs and the nursing notes.  Pertinent labs & imaging results that were available during my care of the patient were reviewed by me and considered in my medical decision making (see chart for details).  Clinical Course as of 03/07/20  0102  Tue Mar 06, 2020  1106 Reviewed chart - in ER for seizure yesterday - keppra load last night around midnight [RD]  1109 EEG October negative [RD]  1152 D/w neuro Collins - rec load keppra 2g now, increase home dose to 1,000mg  bid and closely f/u with his neurologist. Needs MRI at some point to complete work up if this hasn't been done yet [RD]    Clinical Course User Index [RD] Milagros Loll, MD   MDM Rules/Calculators/A&P                         35 year old male with history of seizure disorder presents to ER with concern for recurrent seizure.  Had seizure yesterday.  In ER, patient was well-appearing.  Vital stable.  Only medical complaint was some right shoulder pain.  Plain films negative for acute pathology, did demonstrate old fracture.  Given the second seizure episode in last 24 hours, reviewed case with on-call neurology.  He recommended increasing his Keppra dosing to 1000 twice daily and close outpatient follow-up with his primary neurologist.  Recommended obtaining MRI as part of general new onset seizure work-up.  Patient states that he believes he had an MRI completed.  Unable to see results in the care everywhere.  Recommended patient discuss this with his neurologist and close follow-up with  neurology.   After the discussed management above, the patient was determined to be safe for discharge.  The patient was in agreement with this plan and all questions regarding their care were answered.  ED return precautions were discussed and the patient will return to the ED with any significant worsening of condition.   Final Clinical Impression(s) / ED Diagnoses Final diagnoses:  Seizure [facility]  Seizure disorder [facility])    Rx / DC Orders ED Discharge Orders         Ordered    levETIRAcetam (KEPPRA) 500 MG tablet  2 times daily        03/06/20 1341           Milagros Loll, MD 03/07/20 [phone]

## 2020-03-08 LAB — LEVETIRACETAM LEVEL: Levetiracetam Lvl: 8.3 ug/mL — ABNORMAL LOW (ref 10.0–40.0)

## 2020-04-11 ENCOUNTER — Other Ambulatory Visit: Payer: Self-pay

## 2020-04-11 MED ORDER — ENTRESTO 97-103 MG PO TABS: 1.0000 | ORAL_TABLET | Freq: Two times a day (BID) | ORAL | 3 refills | Status: DC

## 2020-04-14 NOTE — Progress Notes (Unsigned)
Cardiology Office Note   Date:  04/18/2020   ID:  Mark Garcia, DOB [birth year], MRN [id number]  PCP:  Patient, No Pcp Per  Cardiologist:   Peter [place], MD   Chief Complaint  Patient presents with  . Congestive Heart Failure      History of Present Illness: Mark Garcia is a 35 y.o. male who presents for follow up CHF and hypertensive heart disease. He was admitted in January with Acute systolic CHF.  His echocardiogram from 02/18/2019 showed an ejection fraction of 25 to 30%, severely decreased left ventricular function, moderate LVH, global hypokinesis, severely dilated left ventricle, grade 2 diastolic dysfunction, elevated left atrial pressures, and a dilated left atrium. A  chest x-ray showed interstitial pulmonary edema and cardiomegaly. BP was greater than 220 systolic.  He had not been taking his medication due to lack of insurance however, now he has insurance through his new job.  He was given IV Lasix and his breathing improved. It was felt his findings were consistent with hypertensive heart disease. His losartan was switched to Entresto 24-26 mg twice daily, spironolactone 12.5 daily, and carvedilol 12.5 mg twice daily were added. He has had several outpatient follow up visits with additional titration of medication. He has been compliant with therapy. He works as a Electrical engineer. Repeat Echo done 06/14/19 and was no change with persistent EF 25-30%.   Patient seen in ED in September 2021, Jan 2022 and Feb 2022 with seizures. ? Compliance. Was seen by Neuro Smitty Cords) in October. EEG and CT head negative. Keppra 500 mg bid recommended. After his last seizure his Keppra dose was increased. He reports with his seizures he gets generalized tonic/clonic activity with tongue biting. Has a prolonged post tictal confusion.    He reports that he is feeling well from a cardiac standpoint.  Denies any chest pain or dyspnea. He is more energetic and more active. Unable to drive now. He  gets Entresto via patient assistance. Reports compliance with cardiac meds. Has lost 27 lbs.    Past Medical History:  Diagnosis Date  . CHF (congestive heart failure) (HCC)   . Hypertension   . Seizures (HCC)     Past Surgical History:  Procedure Laterality Date  . 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 carvedilol (COREG) 25 MG tablet TAKE 1 TABLET(25 MG) BY MOUTH TWICE DAILY WITH A MEAL. 30 tablet 6  . HYDROcodone-acetaminophen (NORCO/VICODIN) 5-325 MG tablet Take 1 tablet by mouth every 6 (six) hours as needed. 8 tablet 0  . levETIRAcetam (KEPPRA) 500 MG tablet Take 2 tablets (1,000 mg total) by mouth 2 (two) times daily. 60 tablet 0  . sacubitril-valsartan (ENTRESTO) 97-103 MG Take 1 tablet by mouth 2 (two) times daily. 90 tablet 3  . spironolactone (ALDACTONE) 25 MG tablet TAKE 1 TABLET(25 MG) BY MOUTH DAILY 30 tablet 7   No current facility-administered medications for this visit.    Allergies:   Patient has no known allergies.    Social History:  The patient  reports that he quit smoking about 8 years ago. He has never used smokeless tobacco. He reports that he does not drink alcohol and does not use drugs.   Family History:  The patient's family history includes Arthritis in his mother; Diabetes in his maternal grandmother; Scleroderma in his mother.    ROS:  Please see  the history of present illness.   Otherwise, review of systems are positive for none.   All other systems are reviewed and negative.    PHYSICAL EXAM: VS:  BP (!) 142/77   Pulse 63   Ht 5\' 9"  (1.753 m)   Wt (!) 303 lb 9.6 oz (137.7 kg)   SpO2 99%   BMI 44.83 kg/m  , BMI Body mass index is 44.83 kg/m. GEN: Well nourished, obese, in no acute distress  HEENT: normal  Neck: no JVD, carotid bruits, or masses Cardiac: RRR; no murmurs, rubs, or gallops,no edema  Respiratory:  clear to  auscultation bilaterally, normal work of breathing GI: soft, nontender, nondistended, + BS MS: no deformity or atrophy  Skin: warm and dry, no rash Neuro:  Strength and sensation are intact Psych: euthymic mood, full affect   EKG:  EKG is not ordered today. The ekg ordered today demonstrates N/A   Recent Labs: 10/31/2019: Magnesium 1.6 03/06/2020: ALT 23; BUN 8; Creatinine, Ser 0.98; Hemoglobin 13.2; Platelets 173; Potassium 3.8; Sodium 134    Lipid Panel    Component Value Date/Time   CHOL 144 02/18/2019 0407   TRIG 65 02/18/2019 0407   HDL 42 02/18/2019 0407   CHOLHDL 3.4 02/18/2019 0407   VLDL 13 02/18/2019 0407   LDLCALC 89 02/18/2019 0407      Wt Readings from Last 3 Encounters:  04/18/20 (!) 303 lb 9.6 oz (137.7 kg)  03/06/20 (!) 328 lb 7.8 oz (149 kg)  10/31/19 (!) 328 lb 7.8 oz (149 kg)      Other studies Reviewed: Additional studies/ records that were reviewed today include:  Echocardiogram 02/18/2019 IMPRESSIONS   1. Left ventricular ejection fraction, by visual estimation, is 25 to 30%. The left ventricle has severely decreased function. There is moderately increased left ventricular hypertrophy. 2. The left ventricle demonstrates global hypokinesis. 3. Severely dilated left ventricular internal cavity size. 4. Left ventricular diastolic parameters are consistent with Grade II diastolic dysfunction (pseudonormalization). 5. Elevated left atrial pressure. 6. Global right ventricle has normal systolic function.The right ventricular size is normal. 7. Left atrial size was mildly dilated. 8. Right atrial size was normal. 9. The mitral valve is normal in structure. Mild mitral valve regurgitation. 10. The tricuspid valve is normal in structure. 11. The aortic valve is normal in structure. Aortic valve regurgitation is not visualized. 12. The pulmonic valve was not well visualized. Pulmonic valve regurgitation is not visualized. 13. The inferior vena  cava is normal in size with greater than 50% respiratory variability, suggesting right atrial pressure of 3 mmHg. 14. TR signal is inadequate for assessing pulmonary artery systolic pressure.  Echo 06/14/19: IMPRESSIONS    1. No LV thrombus on contrast imaging. Left ventricular ejection  fraction, by estimation, is 25 to 30%. The left ventricle has severely  decreased function. The left ventricle demonstrates global hypokinesis.  The left ventricular internal cavity size was  moderately to severely dilated. Left ventricular diastolic parameters are  consistent with Grade I diastolic dysfunction (impaired relaxation).  2. Right ventricular systolic function is mildly reduced. The right  ventricular size is normal. Tricuspid regurgitation signal is inadequate  for assessing PA pressure.  3. Left atrial size was mildly dilated.  4. The mitral valve is grossly normal. Mild mitral valve regurgitation.  No evidence of mitral stenosis.  5. The aortic valve is tricuspid. Aortic valve regurgitation is not  visualized. No aortic stenosis is present.   Comparison(s): No significant change from prior study.  ASSESSMENT AND PLAN:  1.  Chronic systolic CHF. EF is still 25-30% despite optimal medical therapy but clinically he is doing much better and is class 1-2. Will continue current therapy. Encouraged with his weight loss. Continue lifestyle modification. Will will reapply for patient assistance with Entresto. Also look into an SGLT 2 inhibitor if we can get assistance for this as well.   2. Morbid obesity. We did order a sleep study before but this was never done. He has lost significant amount of weight.   3. HTN with hypertensive heart disease. BP is well controlled.  4. Seizure disorder. Per Neuro.   Current medicines are reviewed at length with the patient today.  The patient does not have concerns regarding medicines.  The following changes have been made:  no change  Labs/  tests ordered today include:   No orders of the defined types were placed in this encounter.    Disposition:   FU with me in 6 months  Signed, Peter [place]il[place], MD  04/18/2020 4:41 PM    [facility] [address] [place], [place], [address] Phone [phone], Fax [phone]

## 2020-04-18 ENCOUNTER — Other Ambulatory Visit: Payer: Self-pay

## 2020-04-18 ENCOUNTER — Encounter: Payer: Self-pay | Admitting: Cardiology

## 2020-04-18 ENCOUNTER — Ambulatory Visit (INDEPENDENT_AMBULATORY_CARE_PROVIDER_SITE_OTHER): Payer: 59 | Admitting: Cardiology

## 2020-04-18 VITALS — BP 142/77 | HR 63 | Ht 69.0 in | Wt 303.6 lb

## 2020-04-18 DIAGNOSIS — I5022 Chronic systolic (congestive) heart failure: Secondary | ICD-10-CM

## 2020-04-18 DIAGNOSIS — I110 Hypertensive heart disease with heart failure: Secondary | ICD-10-CM

## 2020-04-18 MED ORDER — ENTRESTO 97-103 MG PO TABS: 1.0000 | ORAL_TABLET | Freq: Two times a day (BID) | ORAL | 3 refills | Status: DC

## 2020-04-18 MED ORDER — EMPAGLIFLOZIN 10 MG PO TABS: 10.0000 mg | ORAL_TABLET | Freq: Every day | ORAL | 3 refills | Status: DC

## 2020-04-18 NOTE — Addendum Note (Signed)
Addended by: Neoma Laming on: 04/18/2020 05:15 PM   Modules accepted: Orders

## 2020-04-22 ENCOUNTER — Emergency Department (HOSPITAL_COMMUNITY)
Admission: EM | Admit: 2020-04-22 | Discharge: 2020-04-22 | Disposition: A | Discharge status: Home / Self Care | Payer: Self-pay | Attending: Emergency Medicine | Admitting: Emergency Medicine

## 2020-04-22 DIAGNOSIS — Z87891 Personal history of nicotine dependence: Secondary | ICD-10-CM | POA: Insufficient documentation

## 2020-04-22 DIAGNOSIS — Z8669 Personal history of other diseases of the nervous system and sense organs: Secondary | ICD-10-CM | POA: Insufficient documentation

## 2020-04-22 DIAGNOSIS — I110 Hypertensive heart disease with heart failure: Secondary | ICD-10-CM | POA: Insufficient documentation

## 2020-04-22 DIAGNOSIS — R569 Unspecified convulsions: Secondary | ICD-10-CM | POA: Insufficient documentation

## 2020-04-22 DIAGNOSIS — I5021 Acute systolic (congestive) heart failure: Secondary | ICD-10-CM | POA: Insufficient documentation

## 2020-04-22 DIAGNOSIS — R000 Tachycardia, unspecified: Secondary | ICD-10-CM | POA: Insufficient documentation

## 2020-04-22 DIAGNOSIS — Z79899 Other long term (current) drug therapy: Secondary | ICD-10-CM | POA: Insufficient documentation

## 2020-04-22 LAB — CBC WITH DIFFERENTIAL/PLATELET
Abs Immature Granulocytes: 0.04 10*3/uL (ref 0.00–0.07)
Basophils Absolute: 0 10*3/uL (ref 0.0–0.1)
Basophils Relative: 0 %
Eosinophils Absolute: 0.2 10*3/uL (ref 0.0–0.5)
Eosinophils Relative: 3 %
HCT: 40.9 % (ref 39.0–52.0)
Hemoglobin: 13 g/dL (ref 13.0–17.0)
Immature Granulocytes: 1 %
Lymphocytes Relative: 16 %
Lymphs Abs: 1.2 10*3/uL (ref 0.7–4.0)
MCH: 28.6 pg (ref 26.0–34.0)
MCHC: 31.8 g/dL (ref 30.0–36.0)
MCV: 89.9 fL (ref 80.0–100.0)
Monocytes Absolute: 0.3 10*3/uL (ref 0.1–1.0)
Monocytes Relative: 4 %
Neutro Abs: 5.5 10*3/uL (ref 1.7–7.7)
Neutrophils Relative %: 76 %
Platelets: 179 10*3/uL (ref 150–400)
RBC: 4.55 MIL/uL (ref 4.22–5.81)
RDW: 12.6 % (ref 11.5–15.5)
WBC: 7.3 10*3/uL (ref 4.0–10.5)
nRBC: 0 % (ref 0.0–0.2)

## 2020-04-22 LAB — BASIC METABOLIC PANEL
Anion gap: 16 — ABNORMAL HIGH (ref 5–15)
BUN: 11 mg/dL (ref 6–20)
CO2: 15 mmol/L — ABNORMAL LOW (ref 22–32)
Calcium: 9.3 mg/dL (ref 8.9–10.3)
Chloride: 105 mmol/L (ref 98–111)
Creatinine, Ser: 0.98 mg/dL (ref 0.61–1.24)
GFR, Estimated: >60 mL/min (ref >60)
Glucose, Bld: 144 mg/dL — ABNORMAL HIGH (ref 70–99)
Potassium: 4.2 mmol/L (ref 3.5–5.1)
Sodium: 136 mmol/L (ref 135–145)

## 2020-04-22 LAB — CBG MONITORING, ED: Glucose-Capillary: 132 mg/dL — ABNORMAL HIGH (ref 70–99)

## 2020-04-22 MED ORDER — LORAZEPAM 2 MG/ML IJ SOLN: 2.0000 mg | Freq: Once | INTRAMUSCULAR | Status: AC

## 2020-04-22 MED ORDER — LEVETIRACETAM IN NACL 1000 MG/100ML IV SOLN
1000.0000 mg | Freq: Once | INTRAVENOUS | Status: AC
  Administered 2020-04-22: 1000 mg via INTRAVENOUS
  Filled 2020-04-22: qty 100

## 2020-04-22 MED ORDER — LORAZEPAM 2 MG/ML IJ SOLN: 4.0000 mg | INTRAMUSCULAR | Status: DC | PRN

## 2020-04-22 MED ORDER — LORAZEPAM 2 MG/ML IJ SOLN
INTRAMUSCULAR | Status: AC
  Administered 2020-04-22: 2 mg via INTRAVENOUS
  Filled 2020-04-22: qty 1

## 2020-04-22 NOTE — Discharge Instructions (Signed)
You were seen in the emergency department for your seizure at home.  You had a second seizure while in the department.  It will be important for you to continue your seizure medication and call your neurologist for close follow-up.  You should not drive until you are evaluated by your neurologist and cleared.  Return to the emergency department for any worsening or concerning symptoms

## 2020-04-22 NOTE — ED Provider Notes (Signed)
[facility] EMERGENCY DEPARTMENT Provider Note   CSN: [id number] Arrival date & time: 04/22/20  8325     History No chief complaint on file.   [name] is a 35 y.o. male.  He has a history of seizures and has been compliant with Keppra.  Had a seizure this morning.  Signs of tongue trauma.  EMS found him postictal but awake.  He had a second seizure after arriving in the emergency department.  Lasted 2 minutes tonic-clonic.  Very combative after seizure.  Received 2 mg of Ativan and then another 2 mg.  Patient unable to give any history, level 5 caveat.  The history is provided by the EMS personnel and the patient.  Seizures Seizure activity on arrival: no   Seizure type:  Grand mal Initial focality:  Unable to specify Episode characteristics: combativeness and generalized shaking   Postictal symptoms: confusion   Return to baseline: no   Severity:  Unable to specify Number of seizures this episode:  2 Progression:  Resolved Context: medical compliance   Recent head injury:  No recent head injuries PTA treatment:  None History of seizures: yes        Past Medical History:  Diagnosis Date  . CHF (congestive heart failure) (HCC)   . Hypertension   . Seizures [facility])     Patient Active Problem List   Diagnosis Date Noted  . Acute systolic CHF (congestive heart failure) (HCC) 02/18/2019  . Morbid obesity (HCC) 02/18/2019  . Congestive heart failure (CHF) (HCC) 02/17/2019  . Acute CHF (congestive heart failure) (HCC) 02/17/2019  . Hypertensive heart disease with heart failure [facility])     Past Surgical History:  Procedure Laterality Date  . 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 Scleroderma Mother   . Arthritis Mother   . Diabetes Maternal Grandmother     Social History   Tobacco Use  . Smoking status: Former Smoker    Quit date: 02/04/2012    Years  since quitting: 8.2  . Smokeless tobacco: Never Used  Substance Use Topics  . Alcohol use: No  . Drug use: No    Home Medications Prior to Admission medications   Medication Sig Start Date End Date Taking? Authorizing Provider  carvedilol (COREG) 25 MG tablet TAKE 1 TABLET(25 MG) BY MOUTH TWICE DAILY WITH A MEAL. 01/05/20   [place], Peter M, MD  empagliflozin (JARDIANCE) 10 MG TABS tablet Take 1 tablet (10 mg total) by mouth daily before breakfast. 04/18/20   [place], Peter M, MD  HYDROcodone-acetaminophen (NORCO/VICODIN) 5-325 MG tablet Take 1 tablet by mouth every 6 (six) hours as needed. 10/31/19   Petrucelli, Samantha R, PA-C  levETIRAcetam (KEPPRA) 500 MG tablet Take 2 tablets (1,000 mg total) by mouth 2 (two) times daily. 03/06/20   Milagros Loll, MD  sacubitril-valsartan (ENTRESTO) 97-103 MG Take 1 tablet by mouth 2 (two) times daily. 04/18/20   [place], Peter M, MD  spironolactone (ALDACTONE) 25 MG tablet TAKE 1 TABLET(25 MG) BY MOUTH DAILY 12/20/19   [place], Peter M, MD    Allergies    Patient has no known allergies.  Review of Systems   Review of Systems  Unable to perform ROS: Mental status change  Neurological: Positive for seizures.    Physical Exam Updated Vital Signs Temp (P) 97.6 F (36.4 C) (Oral)  Physical Exam Vitals and nursing note reviewed.  Constitutional:      Appearance: He is well-developed. He is obese.  HENT:     Head: Normocephalic and atraumatic.  Eyes:     Conjunctiva/sclera: Conjunctivae normal.  Cardiovascular:     Rate and Rhythm: Regular rhythm. Tachycardia present.     Heart sounds: No murmur heard.   Pulmonary:     Effort: Pulmonary effort is normal. No respiratory distress.     Breath sounds: Normal breath sounds.  Abdominal:     Palpations: Abdomen is soft.     Tenderness: There is no abdominal tenderness.  Musculoskeletal:        General: No deformity or signs of injury. Normal range of motion.     Cervical back: Neck supple.   Skin:    General: Skin is warm and dry.  Neurological:     General: No focal deficit present.     Comments: Patient is unresponsive and not following commands.  He is combative requiring seizure pads and staff to restrain limbs to keep patient in the bed.  Moving all extremities nonfocally.     ED Results / Procedures / Treatments   Labs (all labs ordered are listed, but only abnormal results are displayed) Labs Reviewed  BASIC METABOLIC PANEL - Abnormal; Notable for the following components:      Result Value   CO2 15 (*)    Glucose, Bld 144 (*)    Anion gap 16 (*)    All other components within normal limits  CBG MONITORING, ED - Abnormal; Notable for the following components:   Glucose-Capillary 132 (*)    All other components within normal limits  CBC WITH DIFFERENTIAL/PLATELET    EKG EKG Interpretation  Date/Time:  Sunday April 22 2020 07:14:14 EDT Ventricular Rate:  88 PR Interval:    QRS Duration: 104 QT Interval:  384 QTC Calculation: 465 R Axis:   71 Text Interpretation: Sinus rhythm Prolonged PR interval Borderline repolarization abnormality Minimal ST elevation, lateral leads Confirmed by Meridee Score [phone]) on 04/22/2020 7:46:46 AM   Radiology No results found.  Procedures Procedures   Medications Ordered in ED Medications  LORazepam (ATIVAN) 2 MG/ML injection (has no administration in time range)  LORazepam (ATIVAN) 2 MG/ML injection (has no administration in time range)  LORazepam (ATIVAN) injection 4 mg (has no administration in time range)  levETIRAcetam (KEPPRA) IVPB 1000 mg/100 mL premix (has no administration in time range)    ED Course  I have reviewed the triage vital signs and the nursing notes.  Pertinent labs & imaging results that were available during my care of the patient were reviewed by me and considered in my medical decision making (see chart for details).  Clinical Course as of 04/22/20 1757  Wynelle Link Apr 22, 2020  0810  Reassessed-patient still with snoring respirations but sats 100% on room air.  He is not combative now.  No evidence of seizure activity. [MB]  [id number] Labs coming back with a bicarb of 15.  Likely due to his seizure activity and will clear with time.  Patient continues to sleep.  Nursing put him on 2 L due to some dropping sats.  Possibly has some element of sleep apnea. [MB]  [phone] Patient more awake and able to talk.  Knows where he is. [MB]  1109 Reevaluated patient he is back asleep again. [MB]  1234 Patient is sleepy but wakes up carries on a normal conversation.  He said he can curl  his wife for a ride.  He has been observed for over 5 hours without any further seizure activity.  Feel he is appropriate for discharge to follow-up with his neurologist.  Return instructions discussed [MB]    Clinical Course User Index [MB] Terrilee Files, MD   MDM Rules/Calculators/A&P                         This patient complains of seizure x2; this involves an extensive number of treatment Options and is a complaint that carries with it a high risk of complications and Morbidity. The differential includes seizures, subtherapeutic antiepileptics, infection, metabolic derangement  I ordered, reviewed and interpreted labs, which included CBC with normal white count normal hemoglobin, chemistries fairly normal other than low bicarb consistent with some lactic acidosis from the seizure I ordered medication IV Ativan for seizure and agitation, IV Keppra load Additional history obtained from EMS, she has seen him before postictal and has been combative like this Previous records obtained and reviewed in epic, no recent admissions  After the interventions stated above, I reevaluated the patient and found patient to be awake alert and hypertensive.  Asymptomatic.  He has been able to ambulate in the department has no focal complaints.  He is comfortable plan for discharge.  Recommended close follow-up with his  neurologist.  He is calling his wife for a ride  Final Clinical Impression(s) / ED Diagnoses Final diagnoses:  Seizure [facility])    Rx / DC Orders ED Discharge Orders    None       Terrilee Files, MD 04/22/20 1759

## 2020-04-22 NOTE — ED Notes (Signed)
Pt's wife updated.

## 2020-04-22 NOTE — ED Notes (Signed)
Pt repositioned in bed- is now sitting up in bed, pt easily aroused to voice and touch, follows commands, VSS

## 2020-04-22 NOTE — ED Triage Notes (Signed)
Pt comes from home via EMS reports witnessed seizure, pt hx of seizure, last one was in Feb, reports he is compliant with home Keppra. EMS reports pt was initially postictal upon their arrival but was a/o x4 upon arrival to ED. Pt stood to use urinal in room. Pt placed on cardiac monitor then began to seize again. MD called to bedside. 2 mg Ativan given immanently, pt combative and not responding verbally, not able to follow commands. Another 2 mg of Ativan given. After prolonged period of combativeness pt resting quietly but not following commands or responding verbally. EMS placed 20g IV in right hand that was lost during seizure, New 20g IV placed in right forearm prior to initial dose of Ativan. EMS vitals: BP 168/58 RR 20 HR 88 CBG 116

## 2020-04-22 NOTE — ED Notes (Signed)
Pt remains confused and tried to get out of bed to use bathroom. Pt's bed wet, Condom cath placed on pt after he used the urinal. Clean linens and gown provided, pt encouraged to remain in bed for his safety, pt voiced understanding. Will continue to closely monitor pt.

## 2020-04-25 ENCOUNTER — Other Ambulatory Visit: Payer: Self-pay | Admitting: Cardiology

## 2020-07-31 ENCOUNTER — Telehealth: Payer: Self-pay | Admitting: Cardiology

## 2020-07-31 NOTE — Telephone Encounter (Signed)
   Patient calling the office for samples of medication:   1.  What medication and dosage are you requesting samples for?   sacubitril-valsartan (ENTRESTO) 97-103 MG    2.  Are you currently out of this medication? Yes  He is ran out and waiting for his pill box

## 2020-07-31 NOTE — Telephone Encounter (Signed)
Called patient, LVM advising that we did not carry samples of this dosage of medication.  But if he knew would the pill box and medication would be in we could see what the PharmD could help with during this time.   Left call back number.

## 2020-08-02 NOTE — Telephone Encounter (Signed)
Called patient left message on personal voice mail to call me back. 

## 2020-08-07 NOTE — Telephone Encounter (Signed)
Left message for pt to call.

## 2020-08-09 NOTE — Telephone Encounter (Signed)
Called patient left message on personal voice mail to call me back regarding patient assistance for [place] and Jardiance.

## 2020-08-13 ENCOUNTER — Other Ambulatory Visit: Payer: Self-pay | Admitting: Cardiology

## 2020-08-14 NOTE — Telephone Encounter (Signed)
Patient never returned call  

## 2020-08-27 ENCOUNTER — Other Ambulatory Visit: Payer: Self-pay

## 2020-08-27 MED ORDER — SPIRONOLACTONE 25 MG PO TABS: 25.0000 mg | ORAL_TABLET | Freq: Every day | ORAL | 0 refills | Status: DC

## 2020-08-27 MED ORDER — CARVEDILOL 25 MG PO TABS: ORAL_TABLET | ORAL | 3 refills | Status: DC

## 2020-08-27 NOTE — Telephone Encounter (Signed)
E-sent pharmacy  for 90  no refill appt 10/15/20

## 2020-08-27 NOTE — Addendum Note (Signed)
Addended by: Darene Lamer T on: 08/27/2020 05:12 PM   Modules accepted: Orders

## 2020-08-29 ENCOUNTER — Telehealth: Payer: Self-pay

## 2020-08-29 NOTE — Telephone Encounter (Signed)
Received [employer] adverse event report form for entresto.Forms completed and faxed back to [employer] at fax # [phone].

## 2020-10-15 ENCOUNTER — Ambulatory Visit (INDEPENDENT_AMBULATORY_CARE_PROVIDER_SITE_OTHER): Payer: Self-pay | Admitting: Physician Assistant

## 2020-10-15 ENCOUNTER — Other Ambulatory Visit: Payer: Self-pay

## 2020-10-15 VITALS — BP 134/86 | HR 86 | Ht 70.0 in | Wt 292.0 lb

## 2020-10-15 DIAGNOSIS — R569 Unspecified convulsions: Secondary | ICD-10-CM

## 2020-10-15 DIAGNOSIS — I5022 Chronic systolic (congestive) heart failure: Secondary | ICD-10-CM

## 2020-10-15 DIAGNOSIS — I10 Essential (primary) hypertension: Secondary | ICD-10-CM

## 2020-10-15 NOTE — Patient Instructions (Addendum)

## 2020-10-15 NOTE — Progress Notes (Signed)
Cardiology Office Note:    Date:  10/17/2020   ID:  Mark Garcia, DOB August 30, 1985, MRN [id number]  PCP:  Patient, No Pcp Per (Inactive)   [facility] Providers Cardiologist:  Peter Swaziland, MD     Referring MD: No ref. provider found   Chief Complaint  Patient presents with   Follow-up    Seen for Dr. Swaziland    History of Present Illness:    Mark Garcia is a 35 y.o. male with a hx of hypertension, seizure, and history of chronic systolic CHF.  Echocardiogram in January 2021 showed EF of 25 to 30%, severely decreased LV function, moderate LVH, global hypokinesis, severely dilated left ventricle, grade 2 DD.  Chest x-ray showed interstitial pulmonary edema.  Blood pressure was greater than 220 systolic.  He has not been taking his blood pressure medication due to lack of insurance.  He underwent IV diuresis with improvement.  Losartan was switched to [facility].  He was also placed on carvedilol and spironolactone.  Repeat echocardiogram obtained in May 2021 showed persistent LV dysfunction with EF of 25 to 30%.  He was seen in the ED in September 2021, January 2022 and February 2022 with seizure.  Questionable compliance.  He was seen by neurology service at [facility] in October.  CT of head and EEG were negative.  Keppra 500 mg twice daily was recommended.  Dosage was later increased due to recurrent seizure.  He gets Entresto through patient assistance.  He was last seen by Dr. Swaziland on 04/18/2020.  Previously sleep study was ordered however this was never done.  He had another seizure episode and was seen in the ED on 04/22/2020.   Patient presents today for follow-up.  He has been having some shortness of breath lately, he appears to be euvolemic on physical exam.  He says he is still having trouble obtaining Jardiance and Entresto.  We have asked him to fill out the Jardiance medication assistance program in the office.  I have given him 1 month free samples of Jardiance during meantime.  As far  as Entresto.  I discussed with our Child psychotherapist, we have given him information on Entresto medication assistance program as well.  Otherwise, he can follow-up with Dr. Swaziland.  I will message Dr. Swaziland to see when he would want a repeat echocardiogram.  Ideally, if he is compliance is better, and his EF is still low, he would be a candidate for ICD.  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   HYDROcodone-acetaminophen (NORCO/VICODIN) 5-325 MG tablet Take 1 tablet by mouth every 6 (six) hours as needed. (Patient taking differently: Take 1 tablet by mouth every 6 (six) hours as needed for severe pain.)   levETIRAcetam (KEPPRA) 500 MG tablet Take 2 tablets (1,000 mg total) by mouth 2 (two) times daily.   sacubitril-valsartan (ENTRESTO) 97-103 MG Take 1 tablet by mouth 2 (two) times daily.   spironolactone (ALDACTONE) 25 MG tablet Take 1 tablet (25  mg total) by mouth daily. Keep appointment for 10/15/2020     Allergies:   Patient has no known allergies.   Social History   Socioeconomic History   Marital status: Single    Spouse name: Not on file   Number of children: Not on file   Years of education: Not on file   Highest education level: Not on file  Occupational History   Occupation: Engineer, materials, 2nd shift    Employer: [employer]  Tobacco Use   Smoking status: Former    Types: Cigarettes    Quit date: 02/04/2012    Years since quitting: 8.7   Smokeless tobacco: Never  Substance and Sexual Activity   Alcohol use: No   Drug  use: No   Sexual activity: Not on file  Other Topics Concern   Not on file  Social History Narrative   Pt lives with girlfriend.   Social Determinants of Health   Financial Resource Strain: Not on file  Food Insecurity: Not on file  Transportation Needs: Not on file  Physical Activity: Not on file  Stress: Not on file  Social Connections: Not on file     Family History: The patient's family history includes Arthritis in his mother; Diabetes in his maternal grandmother; Scleroderma in his mother.  ROS:   Please see the history of present illness.    All other systems reviewed and are negative.  EKGs/Labs/Other Studies Reviewed:    The following studies were reviewed today:  Echo 06/14/2019  1. No LV thrombus on contrast imaging. Left ventricular ejection  fraction, by estimation, is 25 to 30%. The left ventricle has severely  decreased function. The left ventricle demonstrates global hypokinesis.  The left ventricular internal cavity size was   moderately to severely dilated. Left ventricular diastolic parameters are  consistent with Grade I diastolic dysfunction (impaired relaxation).   2. Right ventricular systolic function is mildly reduced. The right  ventricular size is normal. Tricuspid regurgitation signal is inadequate  for assessing PA pressure.   3. Left atrial size was mildly dilated.   4. The mitral valve is grossly normal. Mild mitral valve regurgitation.  No evidence of mitral stenosis.   5. The aortic valve is tricuspid. Aortic valve regurgitation is not  visualized. No aortic stenosis is present.   Comparison(s): No significant change from prior study.   EKG:  EKG is not ordered today.    Recent Labs: 10/31/2019: Magnesium 1.6 03/06/2020: ALT 23 04/22/2020: BUN 11; Creatinine, Ser 0.98; Hemoglobin 13.0; Platelets 179; Potassium 4.2; Sodium 136  Recent Lipid Panel    Component Value Date/Time   CHOL 144 02/18/2019 0407   TRIG 65 02/18/2019 0407   HDL  42 02/18/2019 0407   CHOLHDL 3.4 02/18/2019 0407   VLDL 13 02/18/2019 0407   LDLCALC 89 02/18/2019 0407     Risk Assessment/Calculations:           Physical Exam:    VS:  BP 134/86   Pulse 86   Ht 5\' 10"  (1.778 m)   Wt 292 lb (132.5 kg)   SpO2 98%   BMI 41.90 kg/m     Wt Readings from Last 3 Encounters:  10/17/20 292 lb (132.5 kg)  04/18/20 (!) 303 lb 9.6 oz (137.7 kg)  03/06/20 (!) 328 lb 7.8 oz (149 kg)     GEN:  Well nourished, well developed in no acute distress HEENT: Normal NECK: No JVD; No carotid bruits LYMPHATICS: No lymphadenopathy CARDIAC: RRR, no murmurs,  rubs, gallops RESPIRATORY:  Clear to auscultation without rales, wheezing or rhonchi  ABDOMEN: Soft, non-tender, non-distended MUSCULOSKELETAL:  No edema; No deformity  SKIN: Warm and dry NEUROLOGIC:  Alert and oriented x 3 PSYCHIATRIC:  Normal affect   ASSESSMENT:    1. Chronic systolic heart failure (HCC)   2. Primary hypertension   3. Seizure (HCC)    PLAN:    In order of problems listed above:  History of chronic systolic heart failure: Likely related to uncontrolled high blood pressure.  Compliance with heart failure medication Has been poor due to financial difficulties.  I have given him 1 month of samples of Jardiance.  We also filled out medication assistance program for his Entresto.  Poor compliance also make him not ideal candidate for ICD.  Hypertension: Blood pressure is borderline high, however patient has completely ran out of Entresto.  We have filled out medication assistance program for him.  History of seizure: On Keppra.  He will need better compliance.        Medication Adjustments/Labs and Tests Ordered: Current medicines are reviewed at length with the patient today.  Concerns regarding medicines are outlined above.  No orders of the defined types were placed in this encounter.  No orders of the defined types were placed in this encounter.   Patient Instructions   Medication Instructions:  Your physician recommends that you continue on your current medications as directed. Please refer to the Current Medication list given to you today.  *If you need a refill on your cardiac medications before your next appointment, please call your pharmacy*  Lab Work: NONE ordered at this time of appointment   If you have labs (blood work) drawn today and your tests are completely normal, you will receive your results only by: MyChart Message (if you have MyChart) OR A paper copy in the mail If you have any lab test that is abnormal or we need to change your treatment, we will call you to review the results.  Testing/Procedures: NONE ordered at this time of appointment   Follow-Up: At [facility], you and your health needs are our priority.  As part of our continuing mission to provide you with exceptional heart care, we have created designated Provider Care Teams.  These Care Teams include your primary Cardiologist (physician) and Advanced Practice Providers (APPs -  Physician Assistants and Nurse Practitioners) who all work together to provide you with the care you need, when you need it.  Your next appointment:   6 month(s)  The format for your next appointment:   In Person  Provider:   Peter Swaziland, MD  Other Instructions    Signed, Azalee Course, PA  10/17/2020 11:33 PM    [facility]

## 2020-10-16 ENCOUNTER — Telehealth: Payer: Self-pay | Admitting: Licensed Clinical Social Worker

## 2020-10-16 ENCOUNTER — Telehealth: Payer: Self-pay

## 2020-10-16 NOTE — Telephone Encounter (Signed)
Faxed the signed applications to [employer] along with medication list and demographics.

## 2020-10-16 NOTE — Telephone Encounter (Signed)
Received a letter from [employer] stating patient is approved through the program from 10/16/20-10/16/21.

## 2020-10-17 ENCOUNTER — Encounter: Payer: Self-pay | Admitting: Physician Assistant

## 2020-10-17 NOTE — Telephone Encounter (Signed)
Pt significant other Fantavia brought pt Entresto application back to the office this afternoon. I was able to meet with her to review [facility] [employer] [facility], pt had recently been screened ineligible for Medicaid (did not meet disability criteria per their report). I noted which additional documents pt would need to gather, where he needs to sign and how to go about bringing information back to our office. Pt significant other also encouraged to reach out to me if any issues with filing for disability or obtaining letter of nonfiling from IRS. Provided her with my contact information and will f/u at the end of next week as a check in/to answer any additional questions/concerns.   Mark Garcia, MSW, LCSW [facility] Heart/Vascular Care Navigation  [phone]

## 2020-10-19 NOTE — Telephone Encounter (Signed)
Spoke with representative at [employer], they have received application. Request I call back by next Wednesday for determination.   Octavio Graves, MSW, LCSW [facility] Heart/Vascular Care Navigation  [phone]

## 2020-10-24 ENCOUNTER — Telehealth: Payer: Self-pay | Admitting: Licensed Clinical Social Worker

## 2020-10-24 NOTE — Telephone Encounter (Signed)
F/u completed again with [employer].  They have no determination still at this time; per representative they have assigned it as "case review" since pt does not have income or coverage at this time. Representative told me to call back in 3-4 business days. Will reattempt again next week.  Mark Garcia, MSW, LCSW [facility] Health Heart/Vascular Care Navigation  [phone]

## 2020-10-24 NOTE — Telephone Encounter (Signed)
LCSW mailed [employer] application to pt home address.  Pt may be eligible to get his carvedilol and spironolactone at no cost to him through mail. I included my card as well for ongoing questions/concerns.   Octavio Graves, MSW, LCSW [facility] Health Heart/Vascular Care Navigation  [phone]

## 2020-11-01 ENCOUNTER — Telehealth: Payer: Self-pay | Admitting: Licensed Clinical Social Worker

## 2020-11-01 NOTE — Telephone Encounter (Signed)
LCSW called and spoke with patient representative at [employer].  They confirmed pt had been approved for Entresto. Rx has been sent to Rx by [facility] in [place]. It is currently in process- in order to receive the first shipment pt will need to answer when pharmacy calls to schedule shipment. Attempted to call pt to let him know this, unable to reach him- message left w/ pt significant others.    Octavio Graves, MSW, LCSW [facility] Heart/Vascular Care Navigation  [phone]

## 2020-11-01 NOTE — Telephone Encounter (Signed)
LCSW attempted to reach pt this afternoon to f/u on assistance applications and let him know about approval for Entresto and need to answer the phone when contacted to arrange his first shipment. Pt did not answer, phone went straight to voicemail. I then called his significant other Fantavia, reached voicemail and was able to leave message requesting call back. If I do not hear from he or his partner next week then I will reattempt. Pt will need to answer phone to receive his first shipment of Entresto.   Octavio Graves, MSW, LCSW [facility] Health Heart/Vascular Care Navigation  [phone]

## 2020-11-01 NOTE — Telephone Encounter (Signed)
Pt significant other returned my call.  She shares that they were aware of approval for [facility] and have confirmed shipment hopefully to arrive Monday. She and pt will work on applications otherwise provided and contact this Clinical research associate with any additional questions/concerns.   Octavio Graves, MSW, LCSW [facility] Health Heart/Vascular Care Navigation  [phone]

## 2020-11-02 ENCOUNTER — Encounter (HOSPITAL_COMMUNITY): Payer: Self-pay | Admitting: *Deleted

## 2020-11-02 ENCOUNTER — Emergency Department (HOSPITAL_COMMUNITY)
Admission: EM | Admit: 2020-11-02 | Discharge: 2020-11-02 | Disposition: A | Discharge status: Home / Self Care | Payer: Self-pay | Attending: Emergency Medicine | Admitting: Emergency Medicine

## 2020-11-02 ENCOUNTER — Other Ambulatory Visit: Payer: Self-pay

## 2020-11-02 DIAGNOSIS — I110 Hypertensive heart disease with heart failure: Secondary | ICD-10-CM | POA: Insufficient documentation

## 2020-11-02 DIAGNOSIS — Z87891 Personal history of nicotine dependence: Secondary | ICD-10-CM | POA: Insufficient documentation

## 2020-11-02 DIAGNOSIS — Z79899 Other long term (current) drug therapy: Secondary | ICD-10-CM | POA: Insufficient documentation

## 2020-11-02 DIAGNOSIS — R569 Unspecified convulsions: Secondary | ICD-10-CM | POA: Insufficient documentation

## 2020-11-02 DIAGNOSIS — I5021 Acute systolic (congestive) heart failure: Secondary | ICD-10-CM | POA: Insufficient documentation

## 2020-11-02 LAB — CBC WITH DIFFERENTIAL/PLATELET
Abs Immature Granulocytes: 0.05 10*3/uL (ref 0.00–0.07)
Basophils Absolute: 0 10*3/uL (ref 0.0–0.1)
Basophils Relative: 1 %
Eosinophils Absolute: 0.4 10*3/uL (ref 0.0–0.5)
Eosinophils Relative: 5 %
HCT: 43.6 % (ref 39.0–52.0)
Hemoglobin: 14 g/dL (ref 13.0–17.0)
Immature Granulocytes: 1 %
Lymphocytes Relative: 23 %
Lymphs Abs: 1.5 10*3/uL (ref 0.7–4.0)
MCH: 27.8 pg (ref 26.0–34.0)
MCHC: 32.1 g/dL (ref 30.0–36.0)
MCV: 86.7 fL (ref 80.0–100.0)
Monocytes Absolute: 0.6 10*3/uL (ref 0.1–1.0)
Monocytes Relative: 9 %
Neutro Abs: 4 10*3/uL (ref 1.7–7.7)
Neutrophils Relative %: 61 %
Platelets: 222 10*3/uL (ref 150–400)
RBC: 5.03 MIL/uL (ref 4.22–5.81)
RDW: 13.2 % (ref 11.5–15.5)
WBC: 6.6 10*3/uL (ref 4.0–10.5)
nRBC: 0 % (ref 0.0–0.2)

## 2020-11-02 LAB — BASIC METABOLIC PANEL
Anion gap: 12 (ref 5–15)
BUN: 12 mg/dL (ref 6–20)
CO2: 20 mmol/L — ABNORMAL LOW (ref 22–32)
Calcium: 9.7 mg/dL (ref 8.9–10.3)
Chloride: 103 mmol/L (ref 98–111)
Creatinine, Ser: 1.02 mg/dL (ref 0.61–1.24)
GFR, Estimated: >60 mL/min (ref >60)
Glucose, Bld: 96 mg/dL (ref 70–99)
Potassium: 3.7 mmol/L (ref 3.5–5.1)
Sodium: 135 mmol/L (ref 135–145)

## 2020-11-02 LAB — CBG MONITORING, ED
Glucose-Capillary: 92 mg/dL (ref 70–99)
Glucose-Capillary: 92 mg/dL (ref 70–99)

## 2020-11-02 MED ORDER — LEVETIRACETAM IN NACL 1000 MG/100ML IV SOLN
1000.0000 mg | Freq: Once | INTRAVENOUS | Status: AC
  Administered 2020-11-02: 1000 mg via INTRAVENOUS
  Filled 2020-11-02: qty 100

## 2020-11-02 NOTE — ED Notes (Signed)
Mom Kennon Portela [phone] would like an update and has a lot of questions

## 2020-11-02 NOTE — ED Triage Notes (Addendum)
PT with 2 witnessed seizures, 10 sec each, poss grand mal.  No longer post ictal.  Denies pain.  Hx of seizures and has been compliant with Keppra, but missed his afternoon dose today.  Some tongue trauma noted.  No urinary incontinence.  Pt was in recliner and slid down to floor.  Cbg 120 Hr 112 Bp 162/92 96% RA

## 2020-11-02 NOTE — ED Provider Notes (Addendum)
[facility] EMERGENCY DEPARTMENT Provider Note   CSN: [id number] Arrival date & time: 11/02/20  1938     History Chief Complaint  Patient presents with   Seizures    Mark Garcia is a 35 y.o. male.  Patient is a 35 year old male with a history of seizures, CHF, hypertension who presents after having a seizure.  He was sitting in a recliner chair watching TV and his girlfriend noticed that he was having a seizure.  He had generalized tonic-clonic type activity lasting about 10 seconds.  He reportedly had 2 episodes.  He reportedly was postictal on EMS arrival.  He denies any trauma.  It sounds like he may have slid out of the recliner.  He denies any injuries.  He says his leg muscles are little sore but denies any bony pain.  He does have a history of seizures and is on Keppra.  He is currently taking 1 g twice daily.  He is followed by a neurologist with [facility].  He denies any recent illnesses.  He says his last seizure was in March.  He did bite his tongue.  He does not think that he was incontinent of urine.      Past Medical History:  Diagnosis Date   CHF (congestive heart failure) (HCC)    Hypertension    Seizures (HCC)     Patient Active Problem List   Diagnosis Date Noted   Acute systolic CHF (congestive heart failure) (HCC) 02/18/2019   Morbid obesity (HCC) 02/18/2019   Congestive heart failure (CHF) (HCC) 02/17/2019   Acute CHF (congestive heart failure) (HCC) 02/17/2019   Hypertensive heart disease with heart failure (HCC)     Past Surgical History:  Procedure Laterality Date   FINGER AMPUTATION Left    tip of L index finger>>got caught in a machine       Family History  Problem Relation Age of Onset   Scleroderma Mother    Arthritis Mother    Diabetes Maternal Grandmother     Social History   Tobacco Use   Smoking status: Former    Types: Cigarettes    Quit date: 02/04/2012    Years since quitting: 8.7   Smokeless  tobacco: Never  Substance Use Topics   Alcohol use: No   Drug use: No    Home Medications Prior to Admission medications   Medication Sig Start Date End Date Taking? Authorizing Provider  carvedilol (COREG) 25 MG tablet TAKE 1 TABLET(25 MG) BY MOUTH TWICE DAILY WITH A MEAL 08/27/20  Yes [place], Peter M, MD  empagliflozin (JARDIANCE) 10 MG TABS tablet Take 1 tablet (10 mg total) by mouth daily before breakfast. 04/18/20  Yes [place], Peter M, MD  levETIRAcetam (KEPPRA) 500 MG tablet Take 2 tablets (1,000 mg total) by mouth 2 (two) times daily. 03/06/20  Yes Dykstra, Quitman Livings, MD  sacubitril-valsartan (ENTRESTO) 97-103 MG Take 1 tablet by mouth 2 (two) times daily. Patient taking differently: Take 2 tablets by mouth 2 (two) times daily. 04/18/20  Yes [place], Peter M, MD  spironolactone (ALDACTONE) 25 MG tablet Take 1 tablet (25 mg total) by mouth daily. Keep appointment for 10/15/2020 08/27/20  Yes [place], Peter M, MD  HYDROcodone-acetaminophen (NORCO/VICODIN) 5-325 MG tablet Take 1 tablet by mouth every 6 (six) hours as needed. Patient not taking: Reported on 11/02/2020 10/31/19   Petrucelli, Pleas Koch, PA-C    Allergies    Patient has no known allergies.  Review of Systems  Review of Systems  Constitutional:  Negative for chills, diaphoresis, fatigue and fever.  HENT:  Negative for congestion, rhinorrhea and sneezing.   Eyes: Negative.   Respiratory:  Negative for cough, chest tightness and shortness of breath.   Cardiovascular:  Negative for chest pain and leg swelling.  Gastrointestinal:  Negative for abdominal pain, blood in stool, diarrhea, nausea and vomiting.  Genitourinary:  Negative for difficulty urinating, flank pain, frequency and hematuria.  Musculoskeletal:  Positive for myalgias. Negative for arthralgias and back pain.  Skin:  Negative for rash.  Neurological:  Positive for seizures. Negative for dizziness, speech difficulty, weakness, numbness and headaches.   Physical  Exam Updated Vital Signs BP (!) 145/94   Pulse 86   Temp 98.8 F (37.1 C)   Resp (!) 25   Ht 5\' 10"  (1.778 m)   Wt 132.5 kg   SpO2 95%   BMI 41.90 kg/m   Physical Exam Constitutional:      Appearance: He is well-developed.  HENT:     Head: Normocephalic and atraumatic.     Mouth/Throat:     Comments: Small abrasions to the tip of the tongue.  No lacerations or active bleeding. Eyes:     Pupils: Pupils are equal, round, and reactive to light.  Cardiovascular:     Rate and Rhythm: Normal rate and regular rhythm.     Heart sounds: Normal heart sounds.  Pulmonary:     Effort: Pulmonary effort is normal. No respiratory distress.     Breath sounds: Normal breath sounds. No wheezing or rales.  Chest:     Chest wall: No tenderness.  Abdominal:     General: Bowel sounds are normal.     Palpations: Abdomen is soft.     Tenderness: There is no abdominal tenderness. There is no guarding or rebound.  Musculoskeletal:        General: Normal range of motion.     Cervical back: Normal range of motion and neck supple.  Lymphadenopathy:     Cervical: No cervical adenopathy.  Skin:    General: Skin is warm and dry.     Findings: No rash.  Neurological:     Mental Status: He is alert and oriented to person, place, and time.     Comments: Motor 5/5 all extremities Sensation grossly intact to LT all extremities Finger to Nose intact, no pronator drift CN II-XII grossly intact      ED Results / Procedures / Treatments   Labs (all labs ordered are listed, but only abnormal results are displayed) Labs Reviewed  BASIC METABOLIC PANEL - Abnormal; Notable for the following components:      Result Value   CO2 20 (*)    All other components within normal limits  CBC WITH DIFFERENTIAL/PLATELET  CBG MONITORING, ED    EKG EKG Interpretation  Date/Time:  Friday November 02 2020 19:45:25 EDT Ventricular Rate:  101 PR Interval:  204 QRS Duration: 99 QT Interval:  353 QTC  Calculation: 458 R Axis:   63 Text Interpretation: Sinus tachycardia Borderline prolonged PR interval Probable left atrial enlargement Borderline T abnormalities, diffuse leads since last tracing no significant change Confirmed by [id number] [phone]) on 11/02/2020 8:29:11 PM  Radiology No results found.  Procedures Procedures   Medications Ordered in ED Medications  levETIRAcetam (KEPPRA) IVPB 1000 mg/100 mL premix (1,000 mg Intravenous New Bag/Given 11/02/20 2154)    ED Course  I have reviewed the triage vital signs and the nursing notes.  Pertinent labs & imaging results that were available during my care of the patient were reviewed by me and considered in my medical decision making (see chart for details).    MDM Rules/Calculators/A&P                           Patient is a 35 year old male who presents after a seizure that was witnessed at home.  His last seizure was in March.  He was completely awake and oriented on my evaluation on arrival to the ED.  He was reported to have a postictal state by EMS.  He has had no further seizure activity in the ED.  He was given a loading dose of Keppra.  His labs are nonconcerning.  He was discharged home in good condition.  He was encouraged to have follow-up with his neurologist.  Seizure precautions and return precautions were given.  On discharge, pt had a brief episode where he got diaphoretic.  His glucose was checked and it was 92.  He says that he feels completely fine.  He is not currently diaphoretic on my exam.  He did not have any further seizure activity.  He says that he is ready to go home.  We will give him something to eat before he leaves.  He was given strict return precautions. Final Clinical Impression(s) / ED Diagnoses Final diagnoses:  Seizure [facility])    Rx / DC Orders ED Discharge Orders     None        Rolan Bucco, MD 11/02/20 2225    Rolan Bucco, MD 11/02/20 2317

## 2020-11-05 ENCOUNTER — Telehealth: Payer: Self-pay | Admitting: Licensed Clinical Social Worker

## 2020-11-05 ENCOUNTER — Other Ambulatory Visit: Payer: Self-pay

## 2020-11-05 MED ORDER — CARVEDILOL 25 MG PO TABS
ORAL_TABLET | ORAL | 3 refills | Status: DC
  Filled 2020-11-05: qty 60, 30d supply, fill #0
  Filled 2020-12-06: qty 60, 30d supply, fill #1
  Filled 2021-02-13 (×2): qty 60, 30d supply, fill #0
  Filled 2021-04-01: qty 60, 30d supply, fill #1
  Filled 2021-05-17: qty 60, 30d supply, fill #2
  Filled 2021-08-16 – 2021-08-27 (×2): qty 60, 30d supply, fill #3
  Filled 2021-10-10: qty 60, 30d supply, fill #4

## 2020-11-05 MED ORDER — SPIRONOLACTONE 25 MG PO TABS
25.0000 mg | ORAL_TABLET | Freq: Every day | ORAL | 3 refills | Status: DC
  Filled 2020-11-05: qty 30, 30d supply, fill #0
  Filled 2020-12-06 – 2021-02-08 (×2): qty 30, 30d supply, fill #1
  Filled 2021-02-08: qty 30, 30d supply, fill #0
  Filled 2021-04-01: qty 30, 30d supply, fill #1
  Filled 2021-05-17: qty 30, 30d supply, fill #2
  Filled 2021-08-16 – 2021-08-27 (×3): qty 30, 30d supply, fill #3
  Filled 2021-10-10: qty 30, 30d supply, fill #4

## 2020-11-05 NOTE — Telephone Encounter (Signed)
LCSW received a call from pt significant other Fantavia.  No voicemail let, returned call and she shared that she is working on the applications but unfortunately pt had seizures over the weekend and so she hasn't been able to complete them thus far. Shared I remain available and if we need to update what we've already completed I can let her know once I have reviewed what's been completed.   I clarified which medications are coming from which places.  Pt sig other agreeable to medications being moved to [facility] pharmacy to help with affordability. Appreciate Cheryl, RN's assistance w/ moving medications to [facility] (carvedilol and spironolactone). Sherryll Burger and London Pepper will come from [facility] respectively. Pt will need to have additional medications transferred to [facility] if would like to get them there as well. Pt has been given [employer] application also which would cover carvedilol and spironolactone for free if completed and approved.   I texted pt sig other at her request to provide her with numbers and contact information for each medication. I also provided her number for [employer] [facility] to see if they can assist pt with completing disability application; if not we can refer them possibly to [facility].   Encouraged her to let me know if any additional assistance needed; I will f/u next week to check in unless they contact me before that time.   Octavio Graves, MSW, LCSW [facility] Heart/Vascular Care Navigation  [phone]

## 2020-11-07 ENCOUNTER — Other Ambulatory Visit: Payer: Self-pay

## 2020-11-19 ENCOUNTER — Telehealth: Payer: Self-pay | Admitting: Licensed Clinical Social Worker

## 2020-11-19 NOTE — Telephone Encounter (Signed)
LCSW sent f/u message to pt significant other Fantavia regarding applications which are still outstanding from previous discussions- shared we could provide them with new applications as needed. Will f/u again if I do not hear from them this week.   Mark Garcia, MSW, LCSW [facility] Heart/Vascular Care Navigation  [phone]

## 2020-11-20 ENCOUNTER — Telehealth: Payer: Self-pay | Admitting: Licensed Clinical Social Worker

## 2020-11-20 NOTE — Telephone Encounter (Signed)
As requested by pt significant other Fantavia [phone]) I have mailed new copies of [place] financial aid packet and [employer] application; also included information regarding applying for disability should pt/pt significant other be interested in that. I will f/u in the next 2 weeks to ensure received.   Octavio Graves, MSW, LCSW [facility] Heart/Vascular Care Navigation  [phone]

## 2020-11-23 ENCOUNTER — Telehealth: Payer: Self-pay | Admitting: Licensed Clinical Social Worker

## 2020-11-23 NOTE — Telephone Encounter (Signed)
LCSW received a call from pt significant other [phone]), pt present in background of call. She shares that pt needs some medical records sent to his lawyer as he has to go to court on Wednesday. LCSW shared that he would need to fill out records release form for HIM, I could leave that at the front desk as it would not get there in the mail expeditiously. They are able to come and pick it up. I shared that I could not ensure that the documents would be able to sent as quickly as needed but that HIM would make every effort to do so. Form left at front desk for pt, pt significant other made aware.   Octavio Graves, MSW, LCSW [facility] Heart/Vascular Care Navigation  [phone]

## 2020-11-27 ENCOUNTER — Telehealth: Payer: Self-pay | Admitting: Licensed Clinical Social Worker

## 2020-11-27 NOTE — Telephone Encounter (Signed)
Since messages were not going through to significant other's phone I called her directly at [phone]. Provided her with those numbers. Also requested they check Spam and Junk folders as sometimes [place] emails get caught there. LCSW confirmed records had been emailed on 10/21 by Larita Fife, HIM specialist. Directed any further questions regarding records to their department.   Octavio Graves, MSW, LCSW [facility] Heart/Vascular Care Navigation  [phone]

## 2020-11-27 NOTE — Telephone Encounter (Signed)
LCSW received the following text message from pt significant other Fantavia [phone]). "Hello. I hope all is well. Marteze hasn't gotten the email that he was supposed to get from Medical Records and if they could sent it today I would be very grateful". Then another text stating "He has court tomorrow morning and they really need his medical records."   LCSW responded and shared that that is not something that I am able to manage the timing of. Inquired if he had called the office and shared that the health information management team could answer any questions that they had related to the records request. I have reached out to medical records to see if there is a particular number that they can directly reach.   Octavio Graves, MSW, LCSW [facility] Heart/Vascular Care Navigation  [phone]

## 2020-12-06 ENCOUNTER — Other Ambulatory Visit (HOSPITAL_COMMUNITY): Payer: Self-pay

## 2021-01-16 ENCOUNTER — Other Ambulatory Visit (HOSPITAL_COMMUNITY): Payer: Self-pay

## 2021-01-16 MED ORDER — LEVETIRACETAM 500 MG PO TABS
1500.0000 mg | ORAL_TABLET | Freq: Two times a day (BID) | ORAL | 1 refills | Status: DC
  Filled 2021-01-16: qty 540, 90d supply, fill #0
  Filled 2021-05-03 – 2021-05-20 (×2): qty 180, 30d supply, fill #0
  Filled 2021-08-02: qty 180, 30d supply, fill #1
  Filled 2021-08-27: qty 180, 30d supply, fill #2
  Filled 2021-10-10: qty 180, 30d supply, fill #0
  Filled 2022-01-08: qty 180, 30d supply, fill #1

## 2021-01-17 ENCOUNTER — Other Ambulatory Visit (HOSPITAL_COMMUNITY): Payer: Self-pay

## 2021-02-09 ENCOUNTER — Other Ambulatory Visit: Payer: Self-pay

## 2021-02-13 ENCOUNTER — Other Ambulatory Visit: Payer: Self-pay

## 2021-02-14 ENCOUNTER — Other Ambulatory Visit: Payer: Self-pay

## 2021-03-29 IMAGING — CR DG SHOULDER 2+V*R*
3 series · 3 of 3 positions shown · non-contrast
Comparison: CT 02/17/2019.  Chest x-ray 02/17/2019.

CLINICAL DATA: Severe right shoulder pain.

EXAM:
RIGHT SHOULDER - 2+ VIEW

[shoulder y view (1 of 2)]
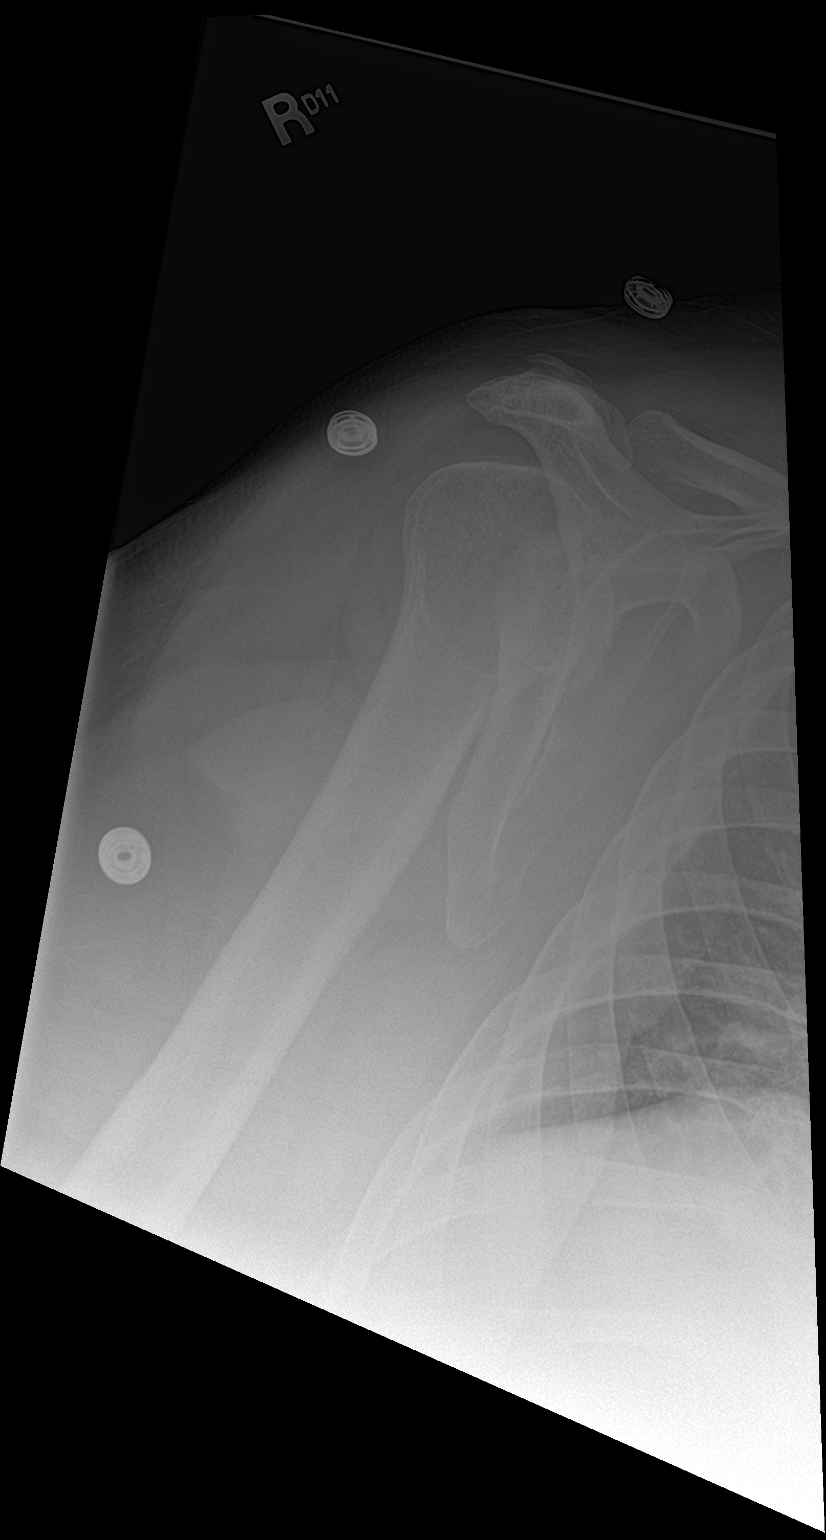

[shoulder ap neutral]
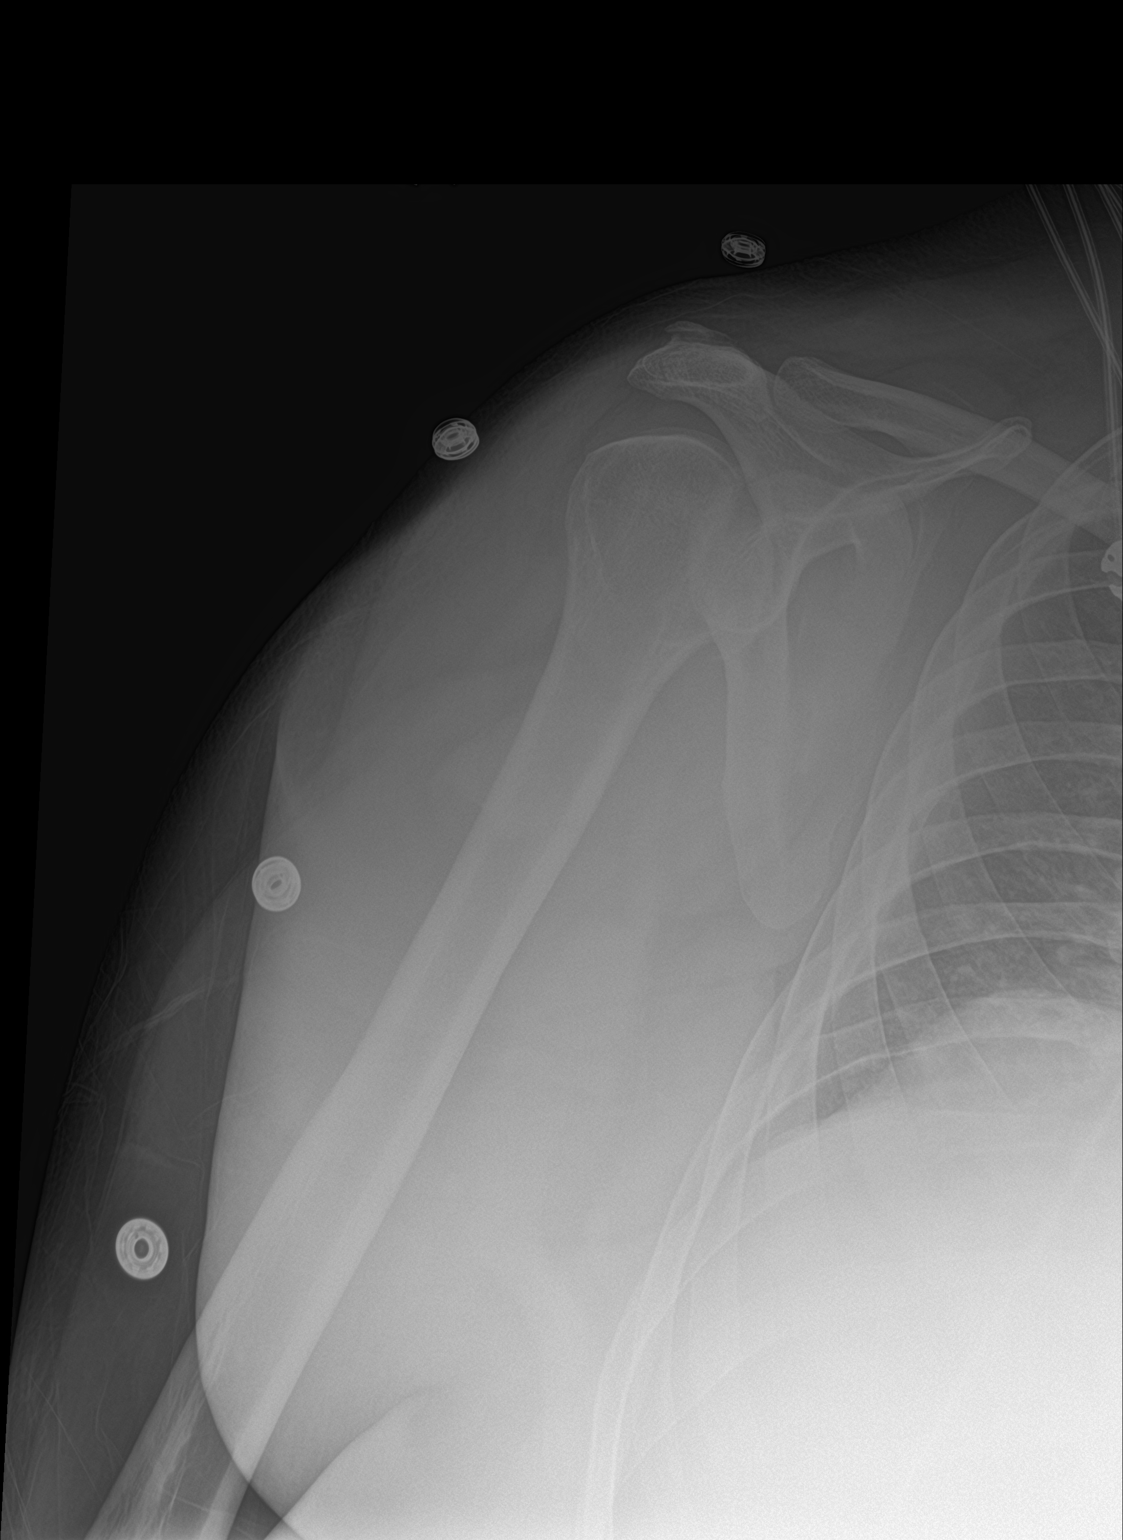

[shoulder y view (2 of 2)]
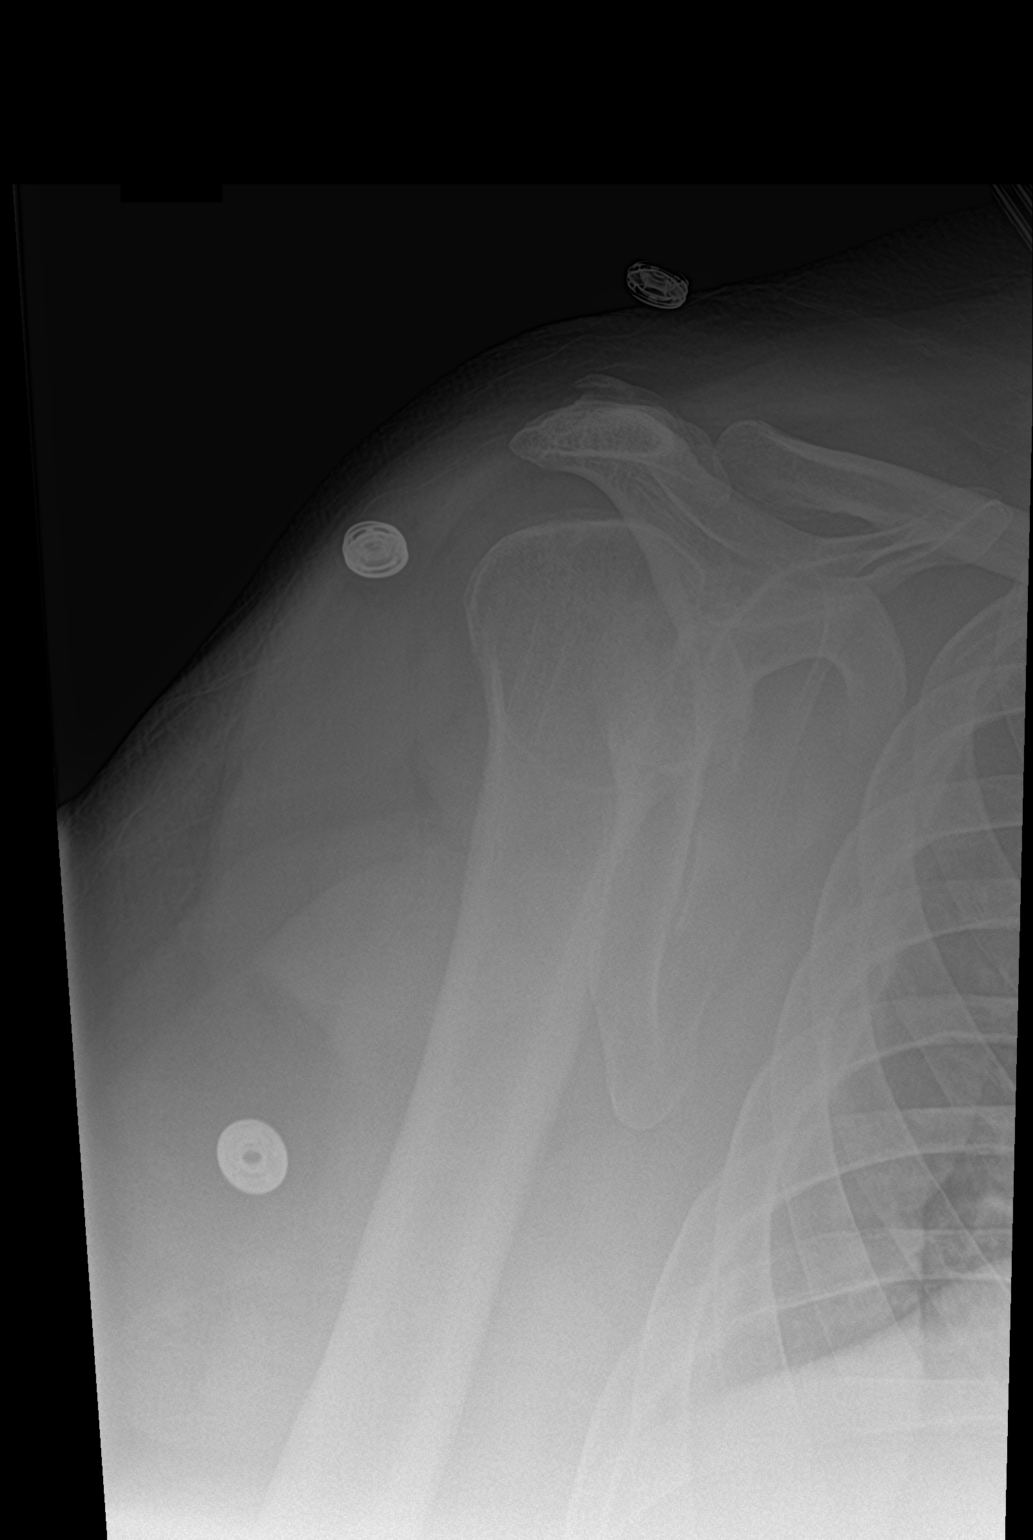

[3 of 3 positions shown; findings below may reference images not displayed]

FINDINGS: Fracture of the medial aspect of the right humeral head cannot be
excluded. A reverse Hill-Sachs lesion cannot be excluded. Question
has the patient had a prior posterior shoulder dislocation. Right
shoulder MRI can be obtained for further evaluation. No evidence of
dislocation or separation.
IMPRESSION: Fracture of the medial aspect of the right humeral head cannot be
excluded. A reverse Hill-Sachs lesion cannot be excluded. Question
has the patient had a prior posterior dislocation. Right shoulder
MRI can be obtained for further evaluation.

## 2021-03-29 IMAGING — CT CT SHOULDER*R* W/O CM
3 series · 14 of 33 positions shown, 17 images · non-contrast
Comparison: None.

CLINICAL DATA: Shoulder trauma. Right shoulder pain for 1 month the
began after seizure.

EXAM:
CT OF THE UPPER RIGHT EXTREMITY WITHOUT CONTRAST
TECHNIQUE: Multidetector CT imaging of the upper right extremity was performed
according to the standard protocol.

[Series 3: right shoulder 3.0 b31s st · axial · 0.57mm/px · z∈[+1013,+1187]mm · 6 of 76 slices shown, 8 images]
[im 12/76  soft-tissue]
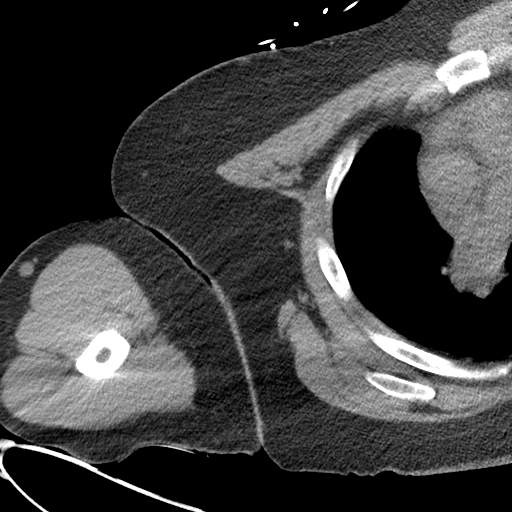
[im 12/76  bone]
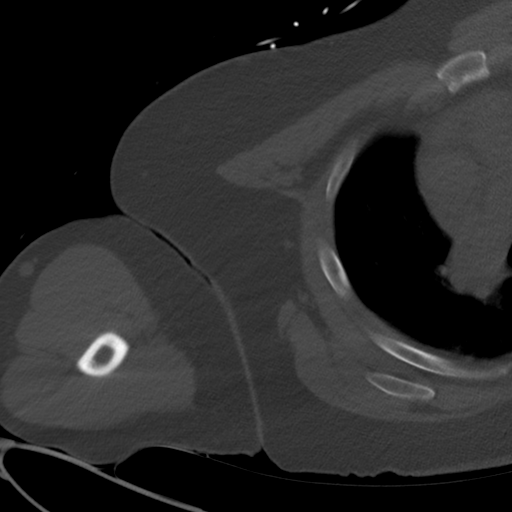
[im 24/76  bone]
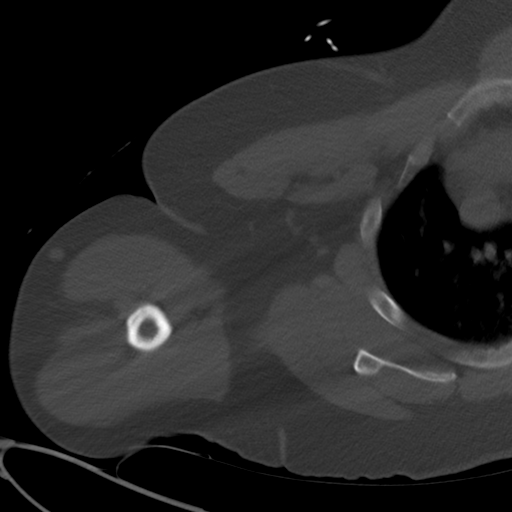
[im 35/76  bone]
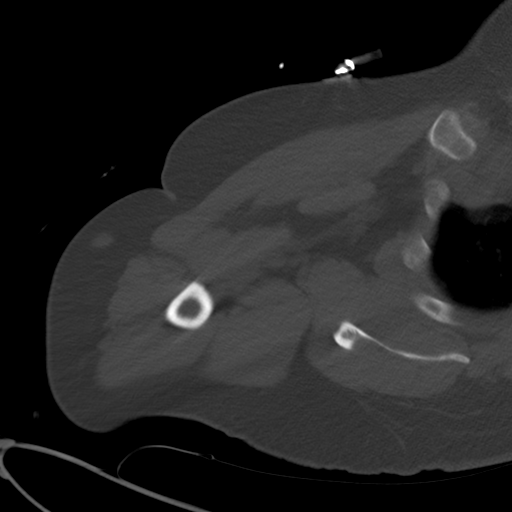
[im 47/76  bone]
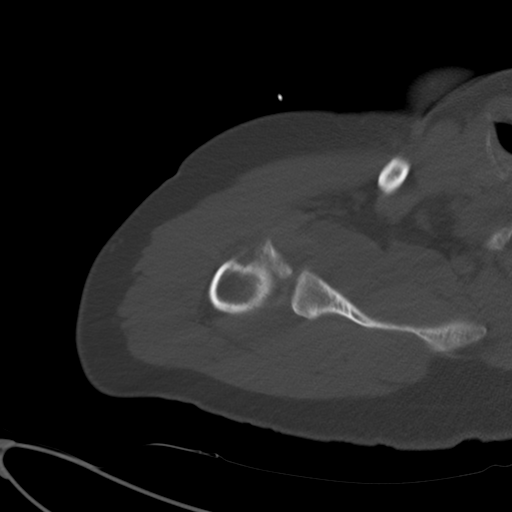
[im 58/76  soft-tissue]
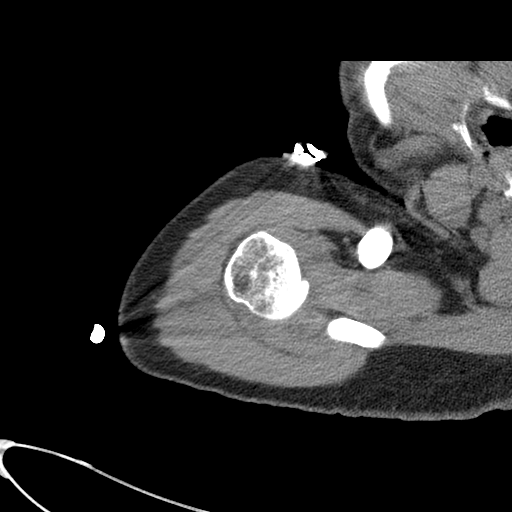
[im 58/76  bone]
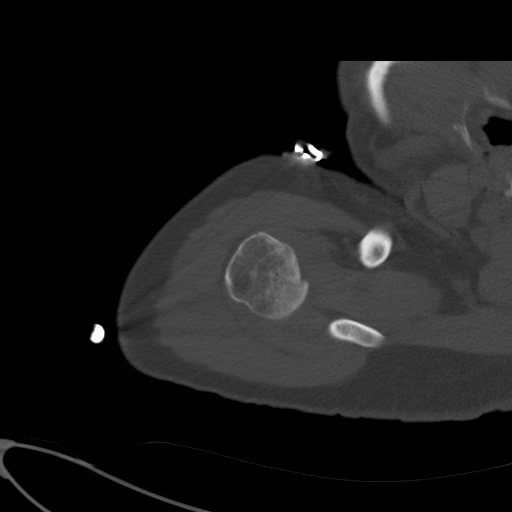
[im 70/76  bone]
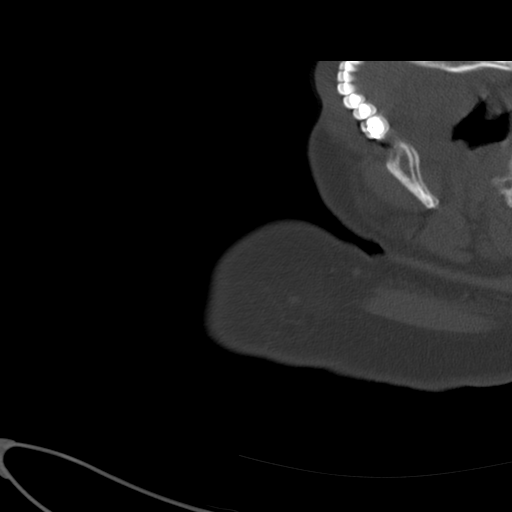

[Series 9: cor st · sagittal · 0.44mm/px · 5 of 141 slices shown, 6 images]
[im 59/141  bone]
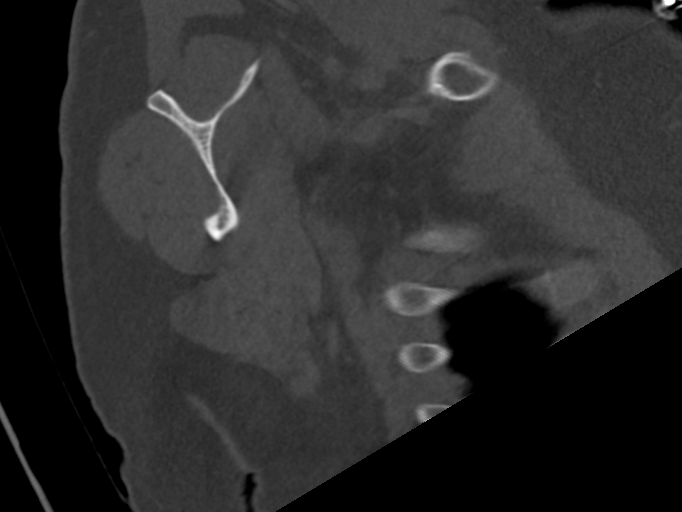
[im 65/141  bone]
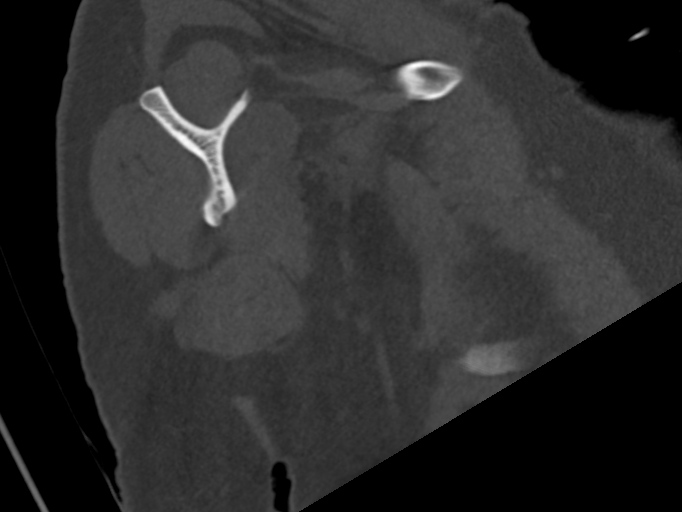
[im 71/141  soft-tissue]
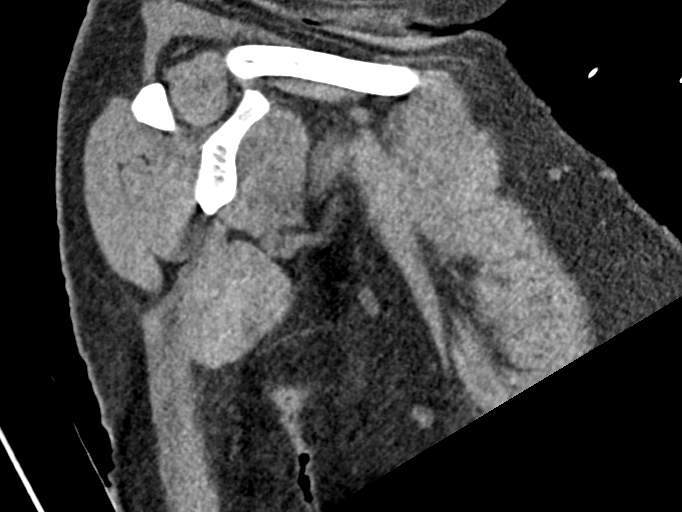
[im 71/141  bone]
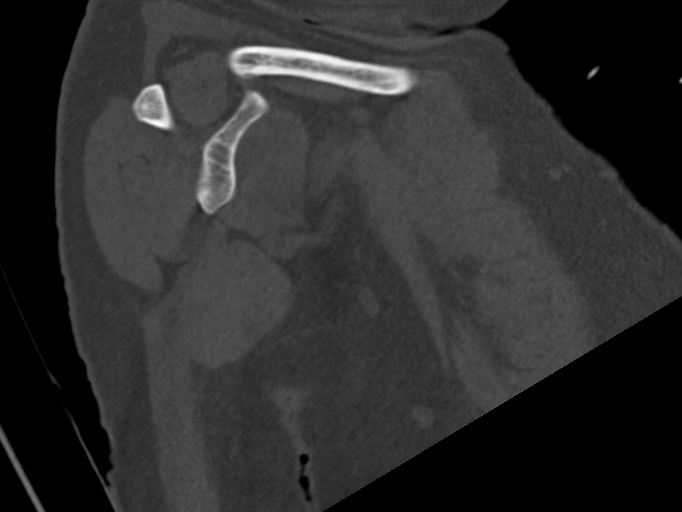
[im 77/141  bone]
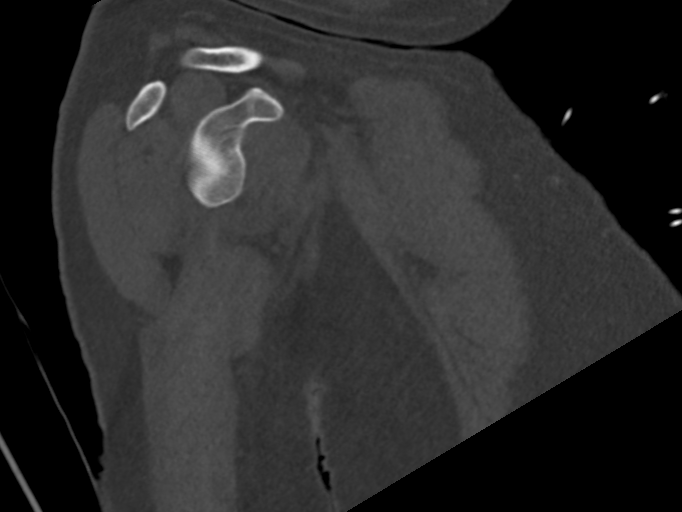
[im 83/141  bone]
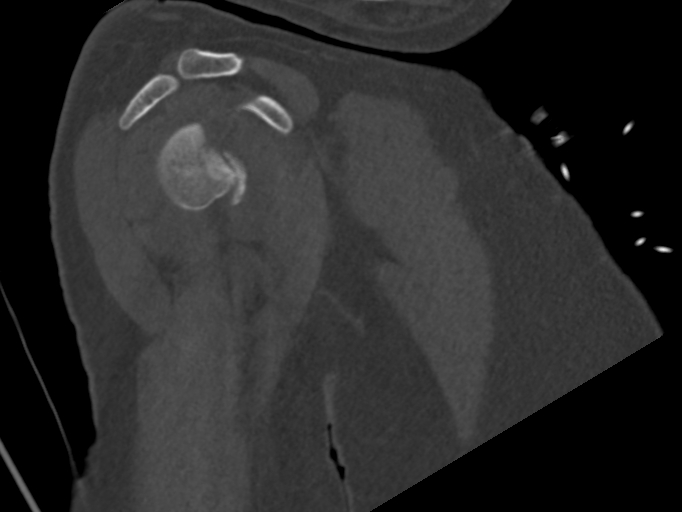

[Series 10: sag st · coronal · 0.50mm/px · 3 of 132 slices shown]
[im 51/132  bone]
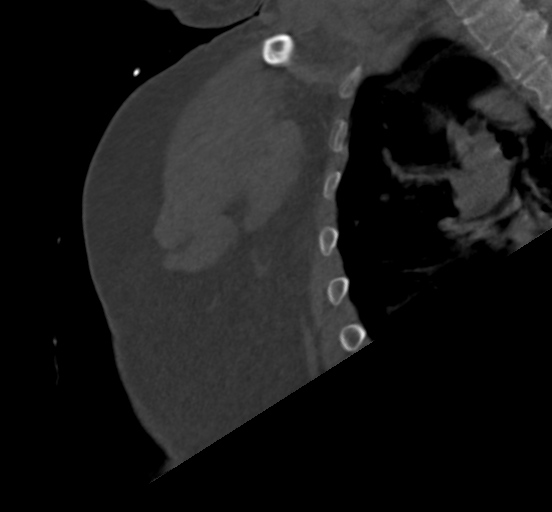
[im 61/132  bone]
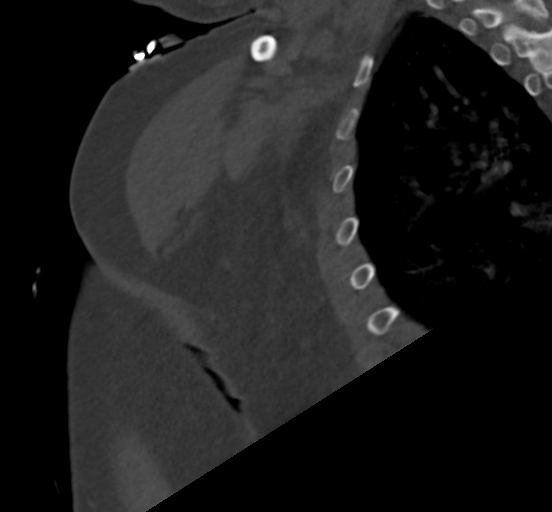
[im 71/132  bone]
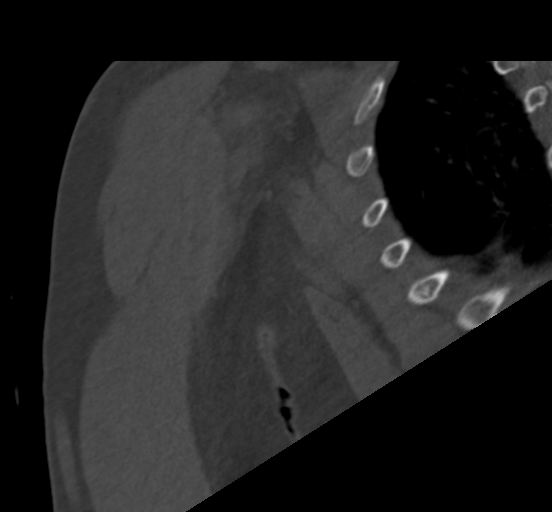

[14 of 33 positions shown; findings below may reference images not displayed]

FINDINGS: Bones/Joint/Cartilage

Comminuted fracture of the anterior aspect of the humeral head
involving the lesser tuberosity and the articular surface. Major
fracture fragment is inferior displaced by 10 mm. 4.6 mm of
depression of the articular surface along the medial aspect of the
humeral head.

No other acute fracture or dislocation. No aggressive osseous
lesion. Moderate joint effusion. Normal acromioclavicular joint.
Incidental note made of an os acromiale.

Ligaments

Ligaments are suboptimally evaluated by CT.

Muscles and Tendons
Muscles are normal.  No muscle atrophy.

Soft tissue
No fluid collection or hematoma.  No soft tissue mass.
IMPRESSION: Comminuted fracture of the anterior aspect of the humeral head
involving the lesser tuberosity and the articular surface. Major
fracture fragment is inferior displaced by 10 mm. 4.6 mm of
depression of the articular surface along the medial aspect of the
humeral head.

## 2021-04-01 ENCOUNTER — Other Ambulatory Visit: Payer: Self-pay

## 2021-04-02 ENCOUNTER — Other Ambulatory Visit: Payer: Self-pay

## 2021-04-10 NOTE — Progress Notes (Deleted)
?Cardiology Office Note:   ? ?Date:  04/10/2021  ? ?ID:  Mark Garcia, DOB 12/07/1985, MRN [id number] ? ?PCP:  Patient, No Pcp Per (Inactive) ?  ?[facility] Providers ?Cardiologist:  Hector Venne [place], MD    ? ?Referring MD: No ref. provider found  ? ?No chief complaint on file. ? ? ?History of Present Illness:   ? ?Mark Garcia is a 36 y.o. male with a hx of hypertension, seizure, and history of chronic systolic CHF.  Echocardiogram in January 2021 showed EF of 25 to 30%, severely decreased LV function, moderate LVH, global hypokinesis, severely dilated left ventricle, grade 2 DD.  Chest x-ray showed interstitial pulmonary edema.  Blood pressure was greater than 220 systolic.  He has not been taking his blood pressure medication due to lack of insurance.  He underwent IV diuresis with improvement.  Losartan was switched to [facility].  He was also placed on carvedilol and spironolactone.  Repeat echocardiogram obtained in May 2021 showed persistent LV dysfunction with EF of 25 to 30%.  He was seen in the ED in September 2021, January 2022 and February 2022 with seizure.  Questionable compliance.  He was seen by neurology service at [facility] in October.  CT of head and EEG were negative.  Keppra 500 mg twice daily was recommended.  Dosage was later increased due to recurrent seizure.  He gets Entresto through patient assistance.  He was last seen by Dr. [place]and on 04/18/2020.  Previously sleep Garcia was ordered however this was never done.  He had another seizure episode and was seen in the ED on 04/22/2020. Last seen in our office in Sept. By Azalee Course PA-C. Efforts were made to get him patient assistance for [place]. In late September was seen in the ED with recurrent seizure.  ? ? ? ? ?Patient presents today for follow-up.  He has been having some shortness of breath lately, he appears to be euvolemic on physical exam.  He says he is still having trouble obtaining Jardiance and Entresto.  We have asked him to  fill out the Jardiance medication assistance program in the office.  I have given him 1 month free samples of Jardiance during meantime.  As far as Entresto.  I discussed with our Child psychotherapist, we have given him information on Entresto medication assistance program as well.  Otherwise, he can follow-up with Dr. Swaziland.  I will message Dr. [place]il[place] to see when he would want a repeat echocardiogram.  Ideally, if he is compliance is better, and his EF is still low, he would be a candidate for ICD. ? ?Past Medical History:  ?Diagnosis Date  ? CHF (congestive heart failure) (HCC)   ? Hypertension   ? Seizures (HCC)   ? ? ?Past Surgical History:  ?Procedure Laterality Date  ? FINGER AMPUTATION Left   ? tip of L index finger>>got caught in a machine  ? ? ?Current Medications: ?No outpatient medications have been marked as taking for the 04/15/21 encounter (Appointment) with [place], Tiaria Biby M, MD.  ?  ? ?Allergies:   Patient has no known allergies.  ? ?Social History  ? ?Socioeconomic History  ? Marital status: Single  ?  Spouse name: Not on file  ? Number of children: Not on file  ? Years of education: Not on file  ? Highest education level: Not on file  ?Occupational History  ? Occupation: Engineer, materials, 2nd shift  ?  Employer: [employer]  ?Tobacco Use  ? Smoking  status: Former  ?  Types: Cigarettes  ?  Quit date: 02/04/2012  ?  Years since quitting: 9.1  ? Smokeless tobacco: Never  ?Substance and Sexual Activity  ? Alcohol use: No  ? Drug use: No  ? Sexual activity: Not on file  ?Other Topics Concern  ? Not on file  ?Social History Narrative  ? Pt lives with girlfriend.  ? ?Social Determinants of Health  ? ?Financial Resource Strain: High Risk  ? Difficulty of Paying Living Expenses: Hard  ?Food Insecurity: No Food Insecurity  ? Worried About Programme researcher, broadcasting/film/videounning Out of Food in the Last Year: Never true  ? Ran Out of Food in the Last Year: Never true  ?Transportation Needs: No Transportation Needs  ? Lack of Transportation  (Medical): No  ? Lack of Transportation (Non-Medical): No  ?Physical Activity: Not on file  ?Stress: Not on file  ?Social Connections: Not on file  ?  ? ?Family History: ?The patient's family history includes Arthritis in his mother; Diabetes in his maternal grandmother; Scleroderma in his mother. ? ?ROS:   ?Please see the history of present illness.    All other systems reviewed and are negative. ? ?EKGs/Labs/Other Studies Reviewed:   ? ?The following studies were reviewed today: ? ?Echo 06/14/2019 ? 1. No LV thrombus on contrast imaging. Left ventricular ejection  ?fraction, by estimation, is 25 to 30%. The left ventricle has severely  ?decreased function. The left ventricle demonstrates global hypokinesis.  ?The left ventricular internal cavity size was  ? moderately to severely dilated. Left ventricular diastolic parameters are  ?consistent with Grade I diastolic dysfunction (impaired relaxation).  ? 2. Right ventricular systolic function is mildly reduced. The right  ?ventricular size is normal. Tricuspid regurgitation signal is inadequate  ?for assessing PA pressure.  ? 3. Left atrial size was mildly dilated.  ? 4. The mitral valve is grossly normal. Mild mitral valve regurgitation.  ?No evidence of mitral stenosis.  ? 5. The aortic valve is tricuspid. Aortic valve regurgitation is not  ?visualized. No aortic stenosis is present.  ? ?Comparison(s): No significant change from prior Garcia.  ? ?EKG:  EKG is not ordered today.   ? ?Recent Labs: ?11/02/2020: BUN 12; Creatinine, Ser 1.02; Hemoglobin 14.0; Platelets 222; Potassium 3.7; Sodium 135  ?Recent Lipid Panel ?   ?Component Value Date/Time  ? CHOL 144 02/18/2019 0407  ? TRIG 65 02/18/2019 0407  ? HDL 42 02/18/2019 0407  ? CHOLHDL 3.4 02/18/2019 0407  ? VLDL 13 02/18/2019 0407  ? LDLCALC 89 02/18/2019 0407  ? ? ? ?Risk Assessment/Calculations:   ?  ? ?    ? ?Physical Exam:   ? ?VS:  There were no vitals taken for this visit.   ? ?Wt Readings from Last 3  Encounters:  ?11/02/20 292 lb (132.5 kg)  ?10/17/20 292 lb (132.5 kg)  ?04/18/20 (!) 303 lb 9.6 oz (137.7 kg)  ?  ? ?GEN:  Well nourished, well developed in no acute distress ?HEENT: Normal ?NECK: No JVD; No carotid bruits ?LYMPHATICS: No lymphadenopathy ?CARDIAC: RRR, no murmurs, rubs, gallops ?RESPIRATORY:  Clear to auscultation without rales, wheezing or rhonchi  ?ABDOMEN: Soft, non-tender, non-distended ?MUSCULOSKELETAL:  No edema; No deformity  ?SKIN: Warm and dry ?NEUROLOGIC:  Alert and oriented x 3 ?PSYCHIATRIC:  Normal affect  ? ?ASSESSMENT:   ? ?No diagnosis found. ? ?PLAN:   ? ?In order of problems listed above: ? ?Chronic systolic heart failure: Likely related to uncontrolled high blood pressure.  Compliance with heart failure medication ?Has been poor due to financial difficulties.  I have given him 1 month of samples of Jardiance.  We also filled out medication assistance program for his Entresto.  Poor compliance also make him not ideal candidate for ICD. ? ?Hypertension: Blood pressure is borderline high, however patient has completely ran out of Entresto.  We have filled out medication assistance program for him. ? ?History of seizure: On Keppra.  He will need better compliance. ? ? ?   ? ? ?Medication Adjustments/Labs and Tests Ordered: ?Current medicines are reviewed at length with the patient today.  Concerns regarding medicines are outlined above.  ?No orders of the defined types were placed in this encounter. ? ? ?No orders of the defined types were placed in this encounter. ? ? ? ?There are no Patient Instructions on file for this visit.  ? ?Signed, ?Bobbyjoe Pabst [place], MD  ?04/10/2021 1:02 PM    ?[facility] ? ?

## 2021-04-15 ENCOUNTER — Ambulatory Visit: Payer: Self-pay | Admitting: Cardiology

## 2021-05-03 ENCOUNTER — Other Ambulatory Visit: Payer: Self-pay

## 2021-05-14 ENCOUNTER — Other Ambulatory Visit: Payer: Self-pay

## 2021-05-17 ENCOUNTER — Other Ambulatory Visit: Payer: Self-pay

## 2021-05-18 NOTE — Progress Notes (Signed)
?Cardiology Office Note:   ? ?Date:  05/23/2021  ? ?ID:  Mark Garcia, DOB 12/08/1985, MRN [id number] ? ?PCP:  Patient, No Pcp Per (Inactive) ?  ?[facility] Providers ?Cardiologist:  Rishik Tubby [place]azil[place]Jordan, MD    ? ?Referring MD: No ref. provider found  ? ?No chief complaint on file. ? ? ?History of Present Illness:   ? ?Mark Garcia is a 36 y.o. male with a hx of hypertension, seizure, and history of chronic systolic CHF.  Echocardiogram in January 2021 showed EF of 25 to 30%, severely decreased LV function, moderate LVH, global hypokinesis, severely dilated left ventricle, grade 2 DD.  Chest x-ray showed interstitial pulmonary edema.  Blood pressure was greater than 220 systolic.  He has not been taking his blood pressure medication due to lack of insurance.  He underwent IV diuresis with improvement.  [facility]  He was also placed on carvedilol and spironolactone.  Repeat echocardiogram obtained in May 2021 showed persistent LV dysfunction with EF of 25 to 30%.  He was seen in the ED in September 2021, January 2022 and February 2022 with seizure.  Questionable compliance.  [facility].  CT of head and EEG were negative.  Keppra 500 mg twice daily was recommended.  Dosage was later increased due to recurrent seizure.  He gets Entresto through patient assistance.  He was last seen by me on 04/18/2020.  Seen by Azalee CourseHao Meng PA-C in September 2022. Previously sleep study was ordered however this was never done.  He had another seizure episode and was seen in the ED on 04/22/2020.  ? ?On follow up today he is feeling very well. Has a 38 year old son and his wife is expecting again. Reports compliance with medication and has been approved for patient assistance for Entresto and Jardiance. He is walking regularly. States he is eating better but still eats a fair amount of bacon and sausage. Breathing is doing very well.  ? ? ?Past Medical History:  ?Diagnosis Date  ? CHF  (congestive heart failure) (HCC)   ? Hypertension   ? Seizures (HCC)   ? ? ?Past Surgical History:  ?Procedure Laterality Date  ? FINGER AMPUTATION Left   ? tip of L index finger>>got caught in a machine  ? ? ?Current Medications: ?Current Meds  ?Medication Sig  ? carvedilol (COREG) 25 MG tablet TAKE 1 TABLET (25 MG) BY MOUTH TWICE DAILY WITH A MEAL  ? empagliflozin (JARDIANCE) 10 MG TABS tablet Take 1 tablet (10 mg total) by mouth daily before breakfast.  ? HYDROcodone-acetaminophen (NORCO/VICODIN) 5-325 MG tablet Take 1 tablet by mouth every 6 (six) hours as needed.  ? levETIRAcetam (KEPPRA) 500 MG tablet Take 3 tablets (1,500 mg total) by mouth 2 (two) times daily.  ? sacubitril-valsartan (ENTRESTO) 97-103 MG Take 1 tablet by mouth 2 (two) times daily. (Patient taking differently: Take 2 tablets by mouth 2 (two) times daily.)  ? spironolactone (ALDACTONE) 25 MG tablet Take 1 tablet (25 mg total) by mouth daily.  ? [DISCONTINUED] levETIRAcetam (KEPPRA) 500 MG tablet Take 2 tablets (1,000 mg total) by mouth 2 (two) times daily.  ?  ? ?Allergies:   Patient has no known allergies.  ? ?Social History  ? ?Socioeconomic History  ? Marital status: Single  ?  Spouse name: Not on file  ? Number of children: Not on file  ? Years of education: Not on file  ? Highest education level:  Not on file  ?Occupational History  ? Occupation: Engineer, materials, 2nd shift  ?  Employer: [employer]  ?Tobacco Use  ? Smoking status: Former  ?  Types: Cigarettes  ?  Quit date: 02/04/2012  ?  Years since quitting: 9.3  ? Smokeless tobacco: Never  ?Substance and Sexual Activity  ? Alcohol use: No  ? Drug use: No  ? Sexual activity: Not on file  ?Other Topics Concern  ? Not on file  ?Social History Narrative  ? Pt lives with girlfriend.  ? ?Social Determinants of Health  ? ?Financial Resource Strain: High Risk  ? Difficulty of Paying Living Expenses: Hard  ?Food Insecurity: No Food Insecurity  ? Worried About Programme researcher, broadcasting/film/video in the Last  Year: Never true  ? Ran Out of Food in the Last Year: Never true  ?Transportation Needs: No Transportation Needs  ? Lack of Transportation (Medical): No  ? Lack of Transportation (Non-Medical): No  ?Physical Activity: Not on file  ?Stress: Not on file  ?Social Connections: Not on file  ?  ? ?Family History: ?The patient's family history includes Arthritis in his mother; Diabetes in his maternal grandmother; Scleroderma in his mother. ? ?ROS:   ?Please see the history of present illness.    All other systems reviewed and are negative. ? ?EKGs/Labs/Other Studies Reviewed:   ? ?The following studies were reviewed today: ? ?Echo 06/14/2019 ? 1. No LV thrombus on contrast imaging. Left ventricular ejection  ?fraction, by estimation, is 25 to 30%. The left ventricle has severely  ?decreased function. The left ventricle demonstrates global hypokinesis.  ?The left ventricular internal cavity size was  ? moderately to severely dilated. Left ventricular diastolic parameters are  ?consistent with Grade I diastolic dysfunction (impaired relaxation).  ? 2. Right ventricular systolic function is mildly reduced. The right  ?ventricular size is normal. Tricuspid regurgitation signal is inadequate  ?for assessing PA pressure.  ? 3. Left atrial size was mildly dilated.  ? 4. The mitral valve is grossly normal. Mild mitral valve regurgitation.  ?No evidence of mitral stenosis.  ? 5. The aortic valve is tricuspid. Aortic valve regurgitation is not  ?visualized. No aortic stenosis is present.  ? ?Comparison(s): No significant change from prior study.  ? ?EKG:  EKG is not ordered today.   ? ?Recent Labs: ?11/02/2020: BUN 12; Creatinine, Ser 1.02; Hemoglobin 14.0; Platelets 222; Potassium 3.7; Sodium 135  ?Recent Lipid Panel ?   ?Component Value Date/Time  ? CHOL 144 02/18/2019 0407  ? TRIG 65 02/18/2019 0407  ? HDL 42 02/18/2019 0407  ? CHOLHDL 3.4 02/18/2019 0407  ? VLDL 13 02/18/2019 0407  ? LDLCALC 89 02/18/2019 0407  ? ? ? ?Risk  Assessment/Calculations:   ?  ? ?    ? ?Physical Exam:   ? ?VS:  BP 136/90   Pulse 67   Ht 5\' 10"  (1.778 m)   Wt 299 lb 3.2 oz (135.7 kg)   SpO2 97%   BMI 42.93 kg/m?    ? ?Wt Readings from Last 3 Encounters:  ?05/23/21 299 lb 3.2 oz (135.7 kg)  ?11/02/20 292 lb (132.5 kg)  ?10/17/20 292 lb (132.5 kg)  ?  ? ?GEN:  Well nourished, well developed in no acute distress ?HEENT: Normal ?NECK: No JVD; No carotid bruits ?LYMPHATICS: No lymphadenopathy ?CARDIAC: RRR, no murmurs, rubs, gallops ?RESPIRATORY:  Clear to auscultation without rales, wheezing or rhonchi  ?ABDOMEN: Soft, non-tender, non-distended ?MUSCULOSKELETAL:  No edema; No deformity  ?  SKIN: Warm and dry ?NEUROLOGIC:  Alert and oriented x 3 ?PSYCHIATRIC:  Normal affect  ? ?ASSESSMENT:   ? ?1. Hypertensive heart disease with heart failure (HCC)   ?2. Chronic systolic heart failure (HCC)   ?3. Morbid obesity (HCC)   ?4. Seizure (HCC)   ? ? ?PLAN:   ? ?In order of problems listed above: ? ?History of chronic systolic heart failure: Likely related to uncontrolled high blood pressure.  Compliance with heart failure medication ?Reports good compliance now and does have patient assistance for Jardiance and Entresto. Reinforced recommendations for low sodium diet. Will update Echo now.  ? ?Hypertension: Blood pressure is borderline high, will monitor. If remains high may need to add hydralazine. ? ?History of seizure: On Keppra.  Last seizure over a year ago.  ? ?He is generally more motivated to be compliant with growing family. Will arrange follow up in 6 months. ? ? ?   ? ? ?Medication Adjustments/Labs and Tests Ordered: ?Current medicines are reviewed at length with the patient today.  Concerns regarding medicines are outlined above.  ?No orders of the defined types were placed in this encounter. ? ? ?No orders of the defined types were placed in this encounter. ? ? ? ?There are no Patient Instructions on file for this visit.  ? ?Signed, ?Daaron Dimarco [place], MD   ?05/23/2021 3:48 PM    ?[facility] ? ?

## 2021-05-20 ENCOUNTER — Other Ambulatory Visit: Payer: Self-pay

## 2021-05-23 ENCOUNTER — Encounter: Payer: Self-pay | Admitting: Cardiology

## 2021-05-23 ENCOUNTER — Ambulatory Visit (INDEPENDENT_AMBULATORY_CARE_PROVIDER_SITE_OTHER): Payer: Self-pay | Admitting: Cardiology

## 2021-05-23 VITALS — BP 136/90 | HR 67 | Ht 70.0 in | Wt 299.2 lb

## 2021-05-23 DIAGNOSIS — I5022 Chronic systolic (congestive) heart failure: Secondary | ICD-10-CM

## 2021-05-23 DIAGNOSIS — I110 Hypertensive heart disease with heart failure: Secondary | ICD-10-CM

## 2021-05-23 DIAGNOSIS — R569 Unspecified convulsions: Secondary | ICD-10-CM

## 2021-05-23 NOTE — Patient Instructions (Signed)
Medication Instructions:  ?Continue same medications ?*If you need a refill on your cardiac medications before your next appointment, please call your pharmacy* ? ? ?Lab Work: ?None  ordered ? ? ?Testing/Procedures: ?Echo ? ? ?Follow-Up: ?At [facility], you and your health needs are our priority.  As part of our continuing mission to provide you with exceptional heart care, we have created designated Provider Care Teams.  These Care Teams include your primary Cardiologist (physician) and Advanced Practice Providers (APPs -  Physician Assistants and Nurse Practitioners) who all work together to provide you with the care you need, when you need it. ? ?We recommend signing up for the patient portal called "MyChart".  Sign up information is provided on this After Visit Summary.  MyChart is used to connect with patients for Virtual Visits (Telemedicine).  Patients are able to view lab/test results, encounter notes, upcoming appointments, etc.  Non-urgent messages can be sent to your provider as well.   ?To learn more about what you can do with MyChart, go to [email or web address].   ? ?Your next appointment:  6 months ?  ? ?The format for your next appointment: Office  ? ? ?Provider:  Dr.Jordan's PA ? ? ?Important Information About Sugar ? ? ? ? ? ? ?

## 2021-05-24 ENCOUNTER — Telehealth: Payer: Self-pay | Admitting: Licensed Clinical Social Worker

## 2021-05-24 NOTE — Telephone Encounter (Signed)
Previously had attempted to connect with pt/pt significant other, was unable to establish consistent contact and pt never established with PCP or applied for [facility]FA/[facility] [facility]. I attempted to connect with pt again today at [phone]. Phone went directly to voicemail, voicemail left. Remailed CAFA/[facility] [facility] and PCP list. I remain available.  ? ?Octavio Graves, MSW, LCSW ?Clinical Social Worker II ?[place] [facility]  ?[phone]- work cell phone (preferred) ?[phone]- desk phone ? ?

## 2021-05-30 ENCOUNTER — Telehealth: Payer: Self-pay | Admitting: Licensed Clinical Social Worker

## 2021-05-30 NOTE — Telephone Encounter (Signed)
LCSW attempted again this afternoon to reach pt at [phone], phone went straight to voicemail. Voicemail left requesting return call. ? ?Octavio Graves, MSW, LCSW ?Clinical Social Worker II ?[place] [facility]  ?[phone]- work cell phone (preferred) ?[phone]- desk phone' ?

## 2021-06-12 ENCOUNTER — Ambulatory Visit (HOSPITAL_COMMUNITY): Payer: Self-pay | Attending: Cardiology

## 2021-06-12 DIAGNOSIS — I110 Hypertensive heart disease with heart failure: Secondary | ICD-10-CM | POA: Insufficient documentation

## 2021-06-12 DIAGNOSIS — R569 Unspecified convulsions: Secondary | ICD-10-CM | POA: Insufficient documentation

## 2021-06-12 DIAGNOSIS — I5022 Chronic systolic (congestive) heart failure: Secondary | ICD-10-CM | POA: Insufficient documentation

## 2021-06-12 LAB — ECHOCARDIOGRAM COMPLETE
Area-P 1/2: 2.14 cm2
MV M vel: 2.43 m/s
MV Peak grad: 23.6 mmHg
S' Lateral: 4.45 cm

## 2021-06-12 MED ORDER — PERFLUTREN LIPID MICROSPHERE: 1.0000 mL | INTRAVENOUS | Status: AC | PRN

## 2021-06-13 MED ORDER — PERFLUTREN LIPID MICROSPHERE
1.0000 mL | INTRAVENOUS | Status: AC | PRN
  Administered 2021-06-12: 3 mL via INTRAVENOUS

## 2021-06-13 MED ORDER — PERFLUTREN LIPID MICROSPHERE: 1.0000 mL | INTRAVENOUS | Status: AC | PRN

## 2021-06-26 ENCOUNTER — Other Ambulatory Visit (HOSPITAL_BASED_OUTPATIENT_CLINIC_OR_DEPARTMENT_OTHER): Payer: Self-pay

## 2021-06-26 MED ORDER — LEVETIRACETAM 500 MG PO TABS
ORAL_TABLET | ORAL | 1 refills | Status: AC
  Filled 2021-12-06 (×2): qty 540, 90d supply, fill #0
  Filled 2022-01-08: qty 180, 30d supply, fill #0

## 2021-07-23 ENCOUNTER — Other Ambulatory Visit: Payer: Self-pay

## 2021-07-23 MED ORDER — ENTRESTO 97-103 MG PO TABS: 1.0000 | ORAL_TABLET | Freq: Two times a day (BID) | ORAL | 0 refills | Status: DC

## 2021-08-02 ENCOUNTER — Other Ambulatory Visit: Payer: Self-pay

## 2021-08-02 ENCOUNTER — Other Ambulatory Visit (HOSPITAL_COMMUNITY): Payer: Self-pay

## 2021-08-16 ENCOUNTER — Other Ambulatory Visit: Payer: Self-pay

## 2021-08-22 ENCOUNTER — Other Ambulatory Visit: Payer: Self-pay

## 2021-08-27 ENCOUNTER — Other Ambulatory Visit: Payer: Self-pay

## 2021-10-10 ENCOUNTER — Other Ambulatory Visit (HOSPITAL_COMMUNITY): Payer: Self-pay

## 2021-10-10 ENCOUNTER — Other Ambulatory Visit: Payer: Self-pay

## 2021-11-26 ENCOUNTER — Ambulatory Visit: Payer: Medicaid Other | Admitting: Physician Assistant

## 2021-12-06 ENCOUNTER — Other Ambulatory Visit: Payer: Self-pay | Admitting: Cardiology

## 2021-12-06 ENCOUNTER — Other Ambulatory Visit: Payer: Self-pay

## 2021-12-06 MED ORDER — SPIRONOLACTONE 25 MG PO TABS
25.0000 mg | ORAL_TABLET | Freq: Every day | ORAL | 3 refills | Status: DC
  Filled 2021-12-06: qty 90, 90d supply, fill #0

## 2021-12-06 MED ORDER — CARVEDILOL 25 MG PO TABS
ORAL_TABLET | ORAL | 3 refills | Status: DC
  Filled 2021-12-06: qty 180, 90d supply, fill #0

## 2021-12-09 ENCOUNTER — Other Ambulatory Visit: Payer: Self-pay

## 2021-12-13 ENCOUNTER — Other Ambulatory Visit: Payer: Self-pay

## 2022-01-01 ENCOUNTER — Other Ambulatory Visit: Payer: Self-pay

## 2022-01-01 ENCOUNTER — Ambulatory Visit: Payer: Self-pay | Attending: Physician Assistant | Admitting: Nurse Practitioner

## 2022-01-01 ENCOUNTER — Encounter: Payer: Self-pay | Admitting: Nurse Practitioner

## 2022-01-01 VITALS — BP 150/90 | HR 93 | Ht 71.0 in | Wt 299.6 lb

## 2022-01-01 DIAGNOSIS — I5022 Chronic systolic (congestive) heart failure: Secondary | ICD-10-CM

## 2022-01-01 DIAGNOSIS — I10 Essential (primary) hypertension: Secondary | ICD-10-CM

## 2022-01-01 DIAGNOSIS — R569 Unspecified convulsions: Secondary | ICD-10-CM

## 2022-01-01 MED ORDER — ENTRESTO 97-103 MG PO TABS
1.0000 | ORAL_TABLET | Freq: Two times a day (BID) | ORAL | 3 refills | Status: DC
  Filled 2022-01-01 – 2022-01-08 (×2): qty 180, 90d supply, fill #0
  Filled 2022-01-09: qty 60, 30d supply, fill #0

## 2022-01-01 MED ORDER — ENTRESTO 97-103 MG PO TABS: 1.0000 | ORAL_TABLET | Freq: Two times a day (BID) | ORAL | 3 refills | Status: DC

## 2022-01-01 MED ORDER — EMPAGLIFLOZIN 10 MG PO TABS
10.0000 mg | ORAL_TABLET | Freq: Every day | ORAL | 3 refills | Status: DC
  Filled 2022-01-01 – 2022-01-08 (×2): qty 90, 90d supply, fill #0
  Filled 2022-01-09: qty 30, 30d supply, fill #0

## 2022-01-01 MED ORDER — EMPAGLIFLOZIN 10 MG PO TABS: 10.0000 mg | ORAL_TABLET | Freq: Every day | ORAL | 3 refills | Status: DC

## 2022-01-01 MED ORDER — CARVEDILOL 25 MG PO TABS
25.0000 mg | ORAL_TABLET | Freq: Two times a day (BID) | ORAL | 3 refills | Status: DC
  Filled 2022-01-01 – 2022-06-20 (×3): qty 180, 90d supply, fill #0

## 2022-01-01 MED ORDER — SPIRONOLACTONE 25 MG PO TABS
25.0000 mg | ORAL_TABLET | Freq: Every day | ORAL | 3 refills | Status: DC
  Filled 2022-01-01 – 2022-01-08 (×2): qty 90, 90d supply, fill #0
  Filled 2022-06-20: qty 81, 81d supply, fill #0
  Filled 2022-06-20: qty 90, 90d supply, fill #0
  Filled 2022-06-20: qty 9, 9d supply, fill #0

## 2022-01-01 NOTE — Progress Notes (Signed)
Office Visit    Patient Name: Mark Garcia Date of Encounter: 01/01/2022  Primary Care Provider:  Patient, No Pcp Per Primary Cardiologist:  Peter [place], MD  Chief Complaint    36 year old male with a history of chronic systolic heart failure, hypertension, and seizures who presents for follow-up related to heart failure.  Past Medical History    Past Medical History:  Diagnosis Date   CHF (congestive heart failure) (HCC)    Hypertension    Seizures (HCC)    Past Surgical History:  Procedure Laterality Date   FINGER AMPUTATION Left    tip of L index finger>>got caught in a machine    Allergies  No Known Allergies  History of Present Illness    36 year old male with the above past medical history including chronic systolic heart failure, hypertension, and seizures.  He has a history of uncontrolled hypertension.  Echocardiogram in January 2021 showed EF 25 to 30%, severely decreased LV function, moderate LVH, global hypokinesis, severely dilated left ventricle, G2 DD.  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  GDMT was initiated with Entresto, carvedilol, spironolactone, and Jardiance.  He has had several ED visits in the setting of seizures, with neurology.  He was last seen in the office on 05/23/2021 and was stable from a cardiac standpoint.  He did report some dietary indiscretion.  Most recent echocardiogram in 06/2021 showed EF 30 to 35%, moderately decreased LV function, LV global hypokinesis, mild concentric LVH, indeterminate diastolic parameters, normal RV systolic function, mild mitral valve regurgitation.   He presents today for follow-up.  Since his last visit he has been stable from a cardiac standpoint.  He states he ran out of his 1 month ago.  He states he was unaware that he could call for refills.  He has noted some mild orthopnea.  He denies chest pain, dyspnea, edema, PND, weight gain.  He denies any recent seizure  activity and reports adherence to his Keppra.  Overall, he reports feeling well.  Home Medications    Current Outpatient Medications  Medication Sig Dispense Refill   HYDROcodone-acetaminophen (NORCO/VICODIN) 5-325 MG tablet Take 1 tablet by mouth every 6 (six) hours as needed. 8 tablet 0   levETIRAcetam (KEPPRA) 500 MG tablet Take 3 tablets (1,500 mg total) by mouth 2 (two) times daily. 540 tablet 1   levETIRAcetam (KEPPRA) 500 MG tablet Take three tablets (1,500 mg dose) by mouth 2 (two) times daily. 540 tablet 1   carvedilol (COREG) 25 MG tablet TAKE 1 TABLET (25 MG) BY MOUTH TWICE DAILY WITH A MEAL 180 tablet 3   empagliflozin (JARDIANCE) 10 MG TABS tablet Take 1 tablet (10 mg total) by mouth daily before breakfast. 90 tablet 3   sacubitril-valsartan (ENTRESTO) 97-103 MG Take 1 tablet by mouth 2 (two) times daily. 180 tablet 3   spironolactone (ALDACTONE) 25 MG tablet Take 1 tablet (25 mg total) by mouth daily. 90 tablet 3   No current facility-administered medications for this visit.     Review of Systems    He denies chest pain, palpitations, dyspnea, pnd, n, v, dizziness, syncope, edema, weight gain, or early satiety. All other systems reviewed and are otherwise negative except as noted above.   Physical Exam    VS:  BP (!) 150/90   Pulse 93   Ht 5\' 11"  (1.803 m)   Wt 299 lb 9.6 oz (135.9 kg)   SpO2 97%   BMI 41.79 kg/m  GEN: Well  nourished, well developed, in no acute distress. HEENT: normal. Neck: Supple, no JVD, carotid bruits, or masses. Cardiac: RRR, no murmurs, rubs, or gallops. No clubbing, cyanosis, edema.  Radials/DP/PT 2+ and equal bilaterally.  Respiratory:  Respirations regular and unlabored, clear to auscultation bilaterally. GI: Soft, nontender, nondistended, BS + x 4. MS: no deformity or atrophy. Skin: warm and dry, no rash. Neuro:  Strength and sensation are intact. Psych: Normal affect.  Accessory Clinical Findings    ECG personally reviewed by me  today - SR, 93 bpm, 1st degree aV block - no acute changes.   Lab Results  Component Value Date   WBC 6.6 11/02/2020   HGB 14.0 11/02/2020   HCT 43.6 11/02/2020   MCV 86.7 11/02/2020   PLT 222 11/02/2020   Lab Results  Component Value Date   CREATININE 1.02 11/02/2020   BUN 12 11/02/2020   NA 135 11/02/2020   K 3.7 11/02/2020   CL 103 11/02/2020   CO2 20 (L) 11/02/2020   Lab Results  Component Value Date   ALT 23 03/06/2020   AST 26 03/06/2020   ALKPHOS 45 03/06/2020   BILITOT 0.5 03/06/2020   Lab Results  Component Value Date   CHOL 144 02/18/2019   HDL 42 02/18/2019   LDLCALC 89 02/18/2019   TRIG 65 02/18/2019   CHOLHDL 3.4 02/18/2019    Lab Results  Component Value Date   HGBA1C 6.0 (H) 02/18/2019    Assessment & Plan   1. Chronic systolic heart failure:  Echo in 06/2021 showed EF 30 to 35%, moderately decreased LV function, LV global hypokinesis, mild concentric LVH, indeterminate diastolic parameters, normal RV systolic function, mild mitral valve regurgitation.  He has been off all of his heart failure medications for the past month.  He states he "ran out."  He was unaware that he could request refills.  We discussed how to avoid this in the future.  He does note some mild orthopnea, otherwise, euvolemic and well compensated on exam.  I will provide refills for his carvedilol, Jardiance, Entresto, and spironolactone.  Will check BMET in 2 weeks.  Encouraged ongoing adherence.  2. Hypertension: BP elevated above goal in office today.  This is in the setting of having been off his medication for 1 month.  Refills provided as above.  3. Seizures: Denies any recent seizure activity, reports adherence to Keppra.  4. Disposition: Follow-up in 1 to 2 months.  HYPERTENSION CONTROL Vitals:   01/01/22 1342 01/01/22 1344 01/01/22 1355  BP: (!) 156/110 (!) 158/110 (!) 150/90    The patient's blood pressure is elevated above target today.  In order to address the  patient's elevated BP: Blood pressure will be monitored at home to determine if medication changes need to be made.; A new medication was prescribed today.; Follow up with general cardiology has been recommended.     [name], NP 01/01/2022, 2:03 PM

## 2022-01-01 NOTE — Patient Instructions (Signed)
Medication Instructions:  Your physician recommends that you continue on your current medications as directed. Please refer to the Current Medication list given to you today.   *If you need a refill on your cardiac medications before your next appointment, please call your pharmacy*   Lab Work: Your physician recommends that you return for lab work in 2 weeks. BMET  If you have labs (blood work) drawn today and your tests are completely normal, you will receive your results only by: MyChart Message (if you have MyChart) OR A paper copy in the mail If you have any lab test that is abnormal or we need to change your treatment, we will call you to review the results.   Testing/Procedures: NONE ordered at this time of appointment     Follow-Up: At [facility], you and your health needs are our priority.  As part of our continuing mission to provide you with exceptional heart care, we have created designated Provider Care Teams.  These Care Teams include your primary Cardiologist (physician) and Advanced Practice Providers (APPs -  Physician Assistants and Nurse Practitioners) who all work together to provide you with the care you need, when you need it.  We recommend signing up for the patient portal called "MyChart".  Sign up information is provided on this After Visit Summary.  MyChart is used to connect with patients for Virtual Visits (Telemedicine).  Patients are able to view lab/test results, encounter notes, upcoming appointments, etc.  Non-urgent messages can be sent to your provider as well.   To learn more about what you can do with MyChart, go to [email or web address].    Your next appointment:   1-2 month(s)  The format for your next appointment:   In Person  Provider:   Bernadene Person, NP        Other Instructions   Important Information About Sugar

## 2022-01-08 ENCOUNTER — Other Ambulatory Visit: Payer: Self-pay

## 2022-01-09 ENCOUNTER — Other Ambulatory Visit: Payer: Self-pay

## 2022-01-09 ENCOUNTER — Encounter: Payer: Self-pay | Admitting: Pharmacist

## 2022-01-15 ENCOUNTER — Other Ambulatory Visit: Payer: Self-pay

## 2022-01-16 ENCOUNTER — Other Ambulatory Visit: Payer: Self-pay

## 2022-02-04 ENCOUNTER — Telehealth: Payer: Self-pay

## 2022-02-04 NOTE — Telephone Encounter (Signed)
Patient assistance forms for Mark Garcia and Mark Garcia mailed to patient to complete and bring back to office along with proof of income.

## 2022-02-05 ENCOUNTER — Ambulatory Visit: Payer: Medicaid Other | Attending: Nurse Practitioner | Admitting: Nurse Practitioner

## 2022-02-05 NOTE — Progress Notes (Deleted)
Office Visit    Patient Name: Mark Garcia Date of Encounter: 02/05/2022  Primary Care Provider:  Patient, No Pcp Per Primary Cardiologist:  Peter Martinique, MD  Chief Complaint    37 year old male with a history of chronic systolic heart failure, hypertension, and seizures who presents for follow-up related to heart failure.  Past Medical History    Past Medical History:  Diagnosis Date   CHF (congestive heart failure) (HCC)    Hypertension    Seizures (HCC)    Past Surgical History:  Procedure Laterality Date   FINGER AMPUTATION Left    tip of L index finger>>got caught in a machine    Allergies  No Known Allergies  History of Present Illness    37 year old male with the above past medical history including chronic systolic heart failure, hypertension, and seizures.   He has a history of uncontrolled hypertension.  Echocardiogram in January 2021 showed EF 25 to 30%, severely decreased LV function, moderate LVH, global hypokinesis, severely dilated left ventricle, G2 DD.  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  GDMT was initiated with Entresto, carvedilol, spironolactone, and Jardiance.  He has had several ED visits in the setting of seizures, follows with neurology.  Most recent echocardiogram in 06/2021 showed EF 30 to 35%, moderately decreased LV function, LV global hypokinesis, mild concentric LVH, indeterminate diastolic parameters, normal RV systolic function, mild mitral valve regurgitation. He was last seen in the office on 01/01/2022 and was stable from a cardiac standpoint.  He reported having run out of his heart failure medications 1 month prior to his visit.  All of his heart failure medications were restarted.    He presents today for follow-up.  Since his last visit he has  1. Chronic systolic heart failure:  Echo in 06/2021 showed EF 30 to 35%, moderately decreased LV function, LV global hypokinesis, mild concentric LVH,  indeterminate diastolic parameters, normal RV systolic function, mild mitral valve regurgitation.  He has been off all of his heart failure medications for the past month.  He states he "ran out."  He was unaware that he could request refills.  We discussed how to avoid this in the future.  He does note some mild orthopnea, otherwise, euvolemic and well compensated on exam.  I will provide refills for his carvedilol, Jardiance, Entresto, and spironolactone.  Will check BMET in 2 weeks.  Encouraged ongoing adherence.   2. Hypertension: BP elevated above goal in office today.  This is in the setting of having been off his medication for 1 month.  Refills provided as above.   3. Seizures: Denies any recent seizure activity, reports adherence to Keppra.  4. Obesity:   5. Disposition: Follow-up in  Home Medications    Current Outpatient Medications  Medication Sig Dispense Refill   carvedilol (COREG) 25 MG tablet Take 1 tablet (25 mg total) by mouth 2 (two) times daily with a meal. 180 tablet 3   empagliflozin (JARDIANCE) 10 MG TABS tablet Take 1 tablet (10 mg total) by mouth daily before breakfast. 90 tablet 3   HYDROcodone-acetaminophen (NORCO/VICODIN) 5-325 MG tablet Take 1 tablet by mouth every 6 (six) hours as needed. 8 tablet 0   levETIRAcetam (KEPPRA) 500 MG tablet Take 3 tablets (1,500 mg total) by mouth 2 (two) times daily. 540 tablet 1   levETIRAcetam (KEPPRA) 500 MG tablet Take three tablets (1,500 mg dose) by mouth 2 (two) times daily. 540 tablet 1   sacubitril-valsartan (ENTRESTO)  97-103 MG Take 1 tablet by mouth 2 (two) times daily. 180 tablet 3   spironolactone (ALDACTONE) 25 MG tablet Take 1 tablet (25 mg total) by mouth daily. 90 tablet 3   No current facility-administered medications for this visit.     Review of Systems    ***.  All other systems reviewed and are otherwise negative except as noted above.    Physical Exam    VS:  There were no vitals taken for this visit.  , BMI There is no height or weight on file to calculate BMI.     GEN: Well nourished, well developed, in no acute distress. HEENT: normal. Neck: Supple, no JVD, carotid bruits, or masses. Cardiac: RRR, no murmurs, rubs, or gallops. No clubbing, cyanosis, edema.  Radials/DP/PT 2+ and equal bilaterally.  Respiratory:  Respirations regular and unlabored, clear to auscultation bilaterally. GI: Soft, nontender, nondistended, BS + x 4. MS: no deformity or atrophy. Skin: warm and dry, no rash. Neuro:  Strength and sensation are intact. Psych: Normal affect.  Accessory Clinical Findings    ECG personally reviewed by me today - *** - no acute changes.   Lab Results  Component Value Date   WBC 6.6 11/02/2020   HGB 14.0 11/02/2020   HCT 43.6 11/02/2020   MCV 86.7 11/02/2020   PLT 222 11/02/2020   Lab Results  Component Value Date   CREATININE 1.02 11/02/2020   BUN 12 11/02/2020   NA 135 11/02/2020   K 3.7 11/02/2020   CL 103 11/02/2020   CO2 20 (L) 11/02/2020   Lab Results  Component Value Date   ALT 23 03/06/2020   AST 26 03/06/2020   ALKPHOS 45 03/06/2020   BILITOT 0.5 03/06/2020   Lab Results  Component Value Date   CHOL 144 02/18/2019   HDL 42 02/18/2019   LDLCALC 89 02/18/2019   TRIG 65 02/18/2019   CHOLHDL 3.4 02/18/2019    Lab Results  Component Value Date   HGBA1C 6.0 (H) 02/18/2019    Assessment & Plan    1.  ***  No BP recorded.  {Refresh Note OR Click here to enter BP  :1}***   [name], NP 02/05/2022, 6:13 AM

## 2022-02-06 ENCOUNTER — Other Ambulatory Visit: Payer: Self-pay

## 2022-02-06 MED ORDER — EMPAGLIFLOZIN 10 MG PO TABS: 10.0000 mg | ORAL_TABLET | Freq: Every day | ORAL | 3 refills | Status: DC

## 2022-02-06 MED ORDER — ENTRESTO 97-103 MG PO TABS: 1.0000 | ORAL_TABLET | Freq: Two times a day (BID) | ORAL | 3 refills | Status: DC

## 2022-06-20 ENCOUNTER — Other Ambulatory Visit: Payer: Self-pay

## 2022-06-27 ENCOUNTER — Inpatient Hospital Stay (HOSPITAL_COMMUNITY): Payer: Medicaid Other

## 2022-06-27 ENCOUNTER — Inpatient Hospital Stay (HOSPITAL_BASED_OUTPATIENT_CLINIC_OR_DEPARTMENT_OTHER)
Admission: EM | Admit: 2022-06-27 | Discharge: 2022-07-01 | DRG: 280 | Disposition: A | Discharge status: Home / Self Care | Payer: Medicaid Other | Attending: Critical Care Medicine | Admitting: Critical Care Medicine

## 2022-06-27 ENCOUNTER — Observation Stay (HOSPITAL_COMMUNITY): Payer: Medicaid Other

## 2022-06-27 ENCOUNTER — Other Ambulatory Visit: Payer: Self-pay

## 2022-06-27 ENCOUNTER — Encounter (HOSPITAL_BASED_OUTPATIENT_CLINIC_OR_DEPARTMENT_OTHER): Payer: Self-pay | Admitting: Emergency Medicine

## 2022-06-27 ENCOUNTER — Other Ambulatory Visit (HOSPITAL_COMMUNITY): Payer: Self-pay

## 2022-06-27 ENCOUNTER — Emergency Department (HOSPITAL_BASED_OUTPATIENT_CLINIC_OR_DEPARTMENT_OTHER): Payer: Medicaid Other | Admitting: Radiology

## 2022-06-27 DIAGNOSIS — N179 Acute kidney failure, unspecified: Secondary | ICD-10-CM | POA: Diagnosis present

## 2022-06-27 DIAGNOSIS — R112 Nausea with vomiting, unspecified: Secondary | ICD-10-CM | POA: Diagnosis not present

## 2022-06-27 DIAGNOSIS — J690 Pneumonitis due to inhalation of food and vomit: Secondary | ICD-10-CM | POA: Diagnosis present

## 2022-06-27 DIAGNOSIS — R001 Bradycardia, unspecified: Secondary | ICD-10-CM

## 2022-06-27 DIAGNOSIS — I5021 Acute systolic (congestive) heart failure: Secondary | ICD-10-CM | POA: Diagnosis not present

## 2022-06-27 DIAGNOSIS — I10 Essential (primary) hypertension: Secondary | ICD-10-CM | POA: Diagnosis present

## 2022-06-27 DIAGNOSIS — I2111 ST elevation (STEMI) myocardial infarction involving right coronary artery: Secondary | ICD-10-CM | POA: Diagnosis not present

## 2022-06-27 DIAGNOSIS — G9341 Metabolic encephalopathy: Secondary | ICD-10-CM | POA: Diagnosis present

## 2022-06-27 DIAGNOSIS — Z87891 Personal history of nicotine dependence: Secondary | ICD-10-CM

## 2022-06-27 DIAGNOSIS — Z781 Physical restraint status: Secondary | ICD-10-CM

## 2022-06-27 DIAGNOSIS — E662 Morbid (severe) obesity with alveolar hypoventilation: Secondary | ICD-10-CM | POA: Diagnosis present

## 2022-06-27 DIAGNOSIS — Z91148 Patient's other noncompliance with medication regimen for other reason: Secondary | ICD-10-CM

## 2022-06-27 DIAGNOSIS — Z8261 Family history of arthritis: Secondary | ICD-10-CM

## 2022-06-27 DIAGNOSIS — I428 Other cardiomyopathies: Secondary | ICD-10-CM | POA: Diagnosis present

## 2022-06-27 DIAGNOSIS — Z9911 Dependence on respirator [ventilator] status: Secondary | ICD-10-CM | POA: Diagnosis not present

## 2022-06-27 DIAGNOSIS — I2510 Atherosclerotic heart disease of native coronary artery without angina pectoris: Secondary | ICD-10-CM | POA: Diagnosis not present

## 2022-06-27 DIAGNOSIS — J9622 Acute and chronic respiratory failure with hypercapnia: Secondary | ICD-10-CM | POA: Diagnosis present

## 2022-06-27 DIAGNOSIS — I214 Non-ST elevation (NSTEMI) myocardial infarction: Secondary | ICD-10-CM | POA: Diagnosis not present

## 2022-06-27 DIAGNOSIS — Z1152 Encounter for screening for COVID-19: Secondary | ICD-10-CM | POA: Diagnosis not present

## 2022-06-27 DIAGNOSIS — R7303 Prediabetes: Secondary | ICD-10-CM | POA: Diagnosis present

## 2022-06-27 DIAGNOSIS — R579 Shock, unspecified: Secondary | ICD-10-CM | POA: Diagnosis present

## 2022-06-27 DIAGNOSIS — I5020 Unspecified systolic (congestive) heart failure: Secondary | ICD-10-CM | POA: Insufficient documentation

## 2022-06-27 DIAGNOSIS — R7989 Other specified abnormal findings of blood chemistry: Secondary | ICD-10-CM | POA: Diagnosis not present

## 2022-06-27 DIAGNOSIS — I2119 ST elevation (STEMI) myocardial infarction involving other coronary artery of inferior wall: Secondary | ICD-10-CM | POA: Diagnosis present

## 2022-06-27 DIAGNOSIS — E876 Hypokalemia: Secondary | ICD-10-CM | POA: Diagnosis present

## 2022-06-27 DIAGNOSIS — I5023 Acute on chronic systolic (congestive) heart failure: Secondary | ICD-10-CM | POA: Diagnosis present

## 2022-06-27 DIAGNOSIS — I110 Hypertensive heart disease with heart failure: Secondary | ICD-10-CM | POA: Diagnosis present

## 2022-06-27 DIAGNOSIS — Z91199 Patient's noncompliance with other medical treatment and regimen due to unspecified reason: Secondary | ICD-10-CM

## 2022-06-27 DIAGNOSIS — Z833 Family history of diabetes mellitus: Secondary | ICD-10-CM | POA: Diagnosis not present

## 2022-06-27 DIAGNOSIS — Z89022 Acquired absence of left finger(s): Secondary | ICD-10-CM

## 2022-06-27 DIAGNOSIS — T465X6A Underdosing of other antihypertensive drugs, initial encounter: Secondary | ICD-10-CM | POA: Diagnosis present

## 2022-06-27 DIAGNOSIS — F1290 Cannabis use, unspecified, uncomplicated: Secondary | ICD-10-CM | POA: Diagnosis present

## 2022-06-27 DIAGNOSIS — Z79899 Other long term (current) drug therapy: Secondary | ICD-10-CM

## 2022-06-27 DIAGNOSIS — J9601 Acute respiratory failure with hypoxia: Secondary | ICD-10-CM | POA: Diagnosis not present

## 2022-06-27 DIAGNOSIS — Y92009 Unspecified place in unspecified non-institutional (private) residence as the place of occurrence of the external cause: Secondary | ICD-10-CM | POA: Diagnosis not present

## 2022-06-27 DIAGNOSIS — I517 Cardiomegaly: Secondary | ICD-10-CM | POA: Diagnosis not present

## 2022-06-27 DIAGNOSIS — Z8673 Personal history of transient ischemic attack (TIA), and cerebral infarction without residual deficits: Secondary | ICD-10-CM

## 2022-06-27 DIAGNOSIS — J9621 Acute and chronic respiratory failure with hypoxia: Secondary | ICD-10-CM | POA: Diagnosis present

## 2022-06-27 DIAGNOSIS — J9602 Acute respiratory failure with hypercapnia: Secondary | ICD-10-CM | POA: Diagnosis not present

## 2022-06-27 DIAGNOSIS — I513 Intracardiac thrombosis, not elsewhere classified: Secondary | ICD-10-CM | POA: Clinically undetermined

## 2022-06-27 DIAGNOSIS — I5043 Acute on chronic combined systolic (congestive) and diastolic (congestive) heart failure: Secondary | ICD-10-CM | POA: Diagnosis present

## 2022-06-27 DIAGNOSIS — Z7984 Long term (current) use of oral hypoglycemic drugs: Secondary | ICD-10-CM

## 2022-06-27 DIAGNOSIS — J810 Acute pulmonary edema: Secondary | ICD-10-CM | POA: Diagnosis not present

## 2022-06-27 DIAGNOSIS — Z6841 Body Mass Index (BMI) 40.0 and over, adult: Secondary | ICD-10-CM | POA: Diagnosis not present

## 2022-06-27 DIAGNOSIS — I420 Dilated cardiomyopathy: Secondary | ICD-10-CM | POA: Diagnosis not present

## 2022-06-27 DIAGNOSIS — I213 ST elevation (STEMI) myocardial infarction of unspecified site: Secondary | ICD-10-CM | POA: Diagnosis not present

## 2022-06-27 DIAGNOSIS — G40909 Epilepsy, unspecified, not intractable, without status epilepticus: Secondary | ICD-10-CM | POA: Diagnosis present

## 2022-06-27 HISTORY — DX: Morbid (severe) obesity due to excess calories: E66.01

## 2022-06-27 HISTORY — DX: Chronic systolic (congestive) heart failure: I50.22

## 2022-06-27 LAB — BLOOD GAS, VENOUS
Acid-Base Excess: 4 mmol/L — ABNORMAL HIGH (ref 0.0–2.0)
Bicarbonate: 31.9 mmol/L — ABNORMAL HIGH (ref 20.0–28.0)
Drawn by: 42736
O2 Saturation: 94.6 %
Patient temperature: 36.4
pCO2, Ven: 60 mmHg (ref 44–60)
pH, Ven: 7.33 (ref 7.25–7.43)
pO2, Ven: 70 mmHg — ABNORMAL HIGH (ref 32–45)

## 2022-06-27 LAB — HEPATIC FUNCTION PANEL
ALT: 42 U/L (ref 0–44)
AST: 44 U/L — ABNORMAL HIGH (ref 15–41)
Albumin: 3.5 g/dL (ref 3.5–5.0)
Alkaline Phosphatase: 44 U/L (ref 38–126)
Bilirubin, Direct: <0.1 mg/dL (ref 0.0–0.2)
Total Bilirubin: 0.7 mg/dL (ref 0.3–1.2)
Total Protein: 7.3 g/dL (ref 6.5–8.1)

## 2022-06-27 LAB — APTT: aPTT: 47 seconds — ABNORMAL HIGH (ref 24–36)

## 2022-06-27 LAB — ECHOCARDIOGRAM COMPLETE
AR max vel: 3.88 cm2
AV Peak grad: 6 mmHg
Ao pk vel: 1.22 m/s
Area-P 1/2: 6.71 cm2
S' Lateral: 5.9 cm
Weight: 4704 oz

## 2022-06-27 LAB — CBC WITH DIFFERENTIAL/PLATELET
Abs Immature Granulocytes: 0 10*3/uL (ref 0.00–0.07)
Basophils Absolute: 0 10*3/uL (ref 0.0–0.1)
Basophils Relative: 0 %
Eosinophils Absolute: 0.1 10*3/uL (ref 0.0–0.5)
Eosinophils Relative: 2 %
HCT: 40 % (ref 39.0–52.0)
Hemoglobin: 13 g/dL (ref 13.0–17.0)
Immature Granulocytes: 0 %
Lymphocytes Relative: 28 %
Lymphs Abs: 1.9 10*3/uL (ref 0.7–4.0)
MCH: 28 pg (ref 26.0–34.0)
MCHC: 32.5 g/dL (ref 30.0–36.0)
MCV: 86 fL (ref 80.0–100.0)
Monocytes Absolute: 0.7 10*3/uL (ref 0.1–1.0)
Monocytes Relative: 10 %
Neutro Abs: 4.1 10*3/uL (ref 1.7–7.7)
Neutrophils Relative %: 60 %
Platelets: 199 10*3/uL (ref 150–400)
RBC: 4.65 MIL/uL (ref 4.22–5.81)
RDW: 13 % (ref 11.5–15.5)
WBC: 6.9 10*3/uL (ref 4.0–10.5)
nRBC: 0 % (ref 0.0–0.2)

## 2022-06-27 LAB — BASIC METABOLIC PANEL
Anion gap: 7 (ref 5–15)
BUN: 14 mg/dL (ref 6–20)
CO2: 29 mmol/L (ref 22–32)
Calcium: 9.5 mg/dL (ref 8.9–10.3)
Chloride: 103 mmol/L (ref 98–111)
Creatinine, Ser: 0.96 mg/dL (ref 0.61–1.24)
GFR, Estimated: >60 mL/min (ref >60)
Glucose, Bld: 101 mg/dL — ABNORMAL HIGH (ref 70–99)
Potassium: 3.9 mmol/L (ref 3.5–5.1)
Sodium: 139 mmol/L (ref 135–145)

## 2022-06-27 LAB — SARS CORONAVIRUS 2 BY RT PCR: SARS Coronavirus 2 by RT PCR: NEGATIVE

## 2022-06-27 LAB — GLUCOSE, CAPILLARY
Glucose-Capillary: 129 mg/dL — ABNORMAL HIGH (ref 70–99)
Glucose-Capillary: 125 mg/dL — ABNORMAL HIGH (ref 70–99)

## 2022-06-27 LAB — RESPIRATORY PANEL BY PCR

## 2022-06-27 LAB — BLOOD GAS, ARTERIAL
Acid-Base Excess: 5.3 mmol/L — ABNORMAL HIGH (ref 0.0–2.0)
Bicarbonate: 33.5 mmol/L — ABNORMAL HIGH (ref 20.0–28.0)
Drawn by: 5665
O2 Saturation: 81 %
Patient temperature: 37
pCO2 arterial: 65 mmHg — ABNORMAL HIGH (ref 32–48)
pH, Arterial: 7.32 — ABNORMAL LOW (ref 7.35–7.45)
pO2, Arterial: 47 mmHg — ABNORMAL LOW (ref 83–108)

## 2022-06-27 LAB — LACTIC ACID, PLASMA
Lactic Acid, Venous: 1.2 mmol/L (ref 0.5–1.9)
Lactic Acid, Venous: 1.1 mmol/L (ref 0.5–1.9)

## 2022-06-27 LAB — RAPID URINE DRUG SCREEN, HOSP PERFORMED
Amphetamines: NOT DETECTED
Barbiturates: NOT DETECTED
Benzodiazepines: NOT DETECTED
Cocaine: NOT DETECTED
Opiates: NOT DETECTED
Tetrahydrocannabinol: POSITIVE — AB

## 2022-06-27 LAB — LIPID PANEL
Cholesterol: 129 mg/dL (ref 0–200)
HDL: 42 mg/dL (ref >40)
LDL Cholesterol: 74 mg/dL (ref 0–99)
Total CHOL/HDL Ratio: 3.1 RATIO
Triglycerides: 64 mg/dL (ref <150)
VLDL: 13 mg/dL (ref 0–40)

## 2022-06-27 LAB — TSH: TSH: 2.244 u[IU]/mL (ref 0.350–4.500)

## 2022-06-27 LAB — TROPONIN I (HIGH SENSITIVITY)
Troponin I (High Sensitivity): 153 ng/L (ref <18)
Troponin I (High Sensitivity): 219 ng/L (ref <18)
Troponin I (High Sensitivity): 302 ng/L (ref <18)
Troponin I (High Sensitivity): 330 ng/L (ref <18)
Troponin I (High Sensitivity): 591 ng/L (ref <18)

## 2022-06-27 LAB — COOXEMETRY PANEL
Carboxyhemoglobin: 1.9 % — ABNORMAL HIGH (ref 0.5–1.5)
Methemoglobin: <0.7 % (ref 0.0–1.5)
O2 Saturation: 96 %
Total hemoglobin: 13.6 g/dL (ref 12.0–16.0)

## 2022-06-27 LAB — MAGNESIUM
Magnesium: 1.4 mg/dL — ABNORMAL LOW (ref 1.7–2.4)
Magnesium: 2.4 mg/dL (ref 1.7–2.4)

## 2022-06-27 LAB — AMMONIA: Ammonia: 33 umol/L (ref 9–35)

## 2022-06-27 LAB — HIV ANTIBODY (ROUTINE TESTING W REFLEX): HIV Screen 4th Generation wRfx: NONREACTIVE

## 2022-06-27 LAB — MRSA NEXT GEN BY PCR, NASAL: MRSA by PCR Next Gen: NOT DETECTED

## 2022-06-27 LAB — BRAIN NATRIURETIC PEPTIDE: B Natriuretic Peptide: 1071.2 pg/mL — ABNORMAL HIGH (ref 0.0–100.0)

## 2022-06-27 LAB — C-REACTIVE PROTEIN: CRP: 1.3 mg/dL — ABNORMAL HIGH (ref <1.0)

## 2022-06-27 MED ORDER — SODIUM CHLORIDE 0.9% FLUSH
3.0000 mL | Freq: Two times a day (BID) | INTRAVENOUS | Status: DC
  Administered 2022-06-27 – 2022-06-28 (×2): 3 mL via INTRAVENOUS
  Administered 2022-06-28: 10 mL via INTRAVENOUS
  Administered 2022-06-29 – 2022-07-01 (×5): 3 mL via INTRAVENOUS

## 2022-06-27 MED ORDER — NOREPINEPHRINE 4 MG/250ML-% IV SOLN
INTRAVENOUS | Status: AC
  Filled 2022-06-27: qty 250

## 2022-06-27 MED ORDER — ROCURONIUM BROMIDE 10 MG/ML (PF) SYRINGE
PREFILLED_SYRINGE | INTRAVENOUS | Status: AC
  Administered 2022-06-27: 100 mg
  Filled 2022-06-27: qty 10

## 2022-06-27 MED ORDER — ETOMIDATE 2 MG/ML IV SOLN
INTRAVENOUS | Status: AC
  Filled 2022-06-27: qty 20

## 2022-06-27 MED ORDER — MAGNESIUM SULFATE 2 GM/50ML IV SOLN
2.0000 g | Freq: Once | INTRAVENOUS | Status: AC
  Filled 2022-06-27: qty 50

## 2022-06-27 MED ORDER — HEPARIN (PORCINE) 25000 UT/250ML-% IV SOLN
2000.0000 [IU]/h | INTRAVENOUS | Status: DC
  Administered 2022-06-27: 1450 [IU]/h via INTRAVENOUS
  Administered 2022-06-28: 2000 [IU]/h via INTRAVENOUS
  Administered 2022-06-28: 1800 [IU]/h via INTRAVENOUS
  Filled 2022-06-27 (×3): qty 250

## 2022-06-27 MED ORDER — FENTANYL CITRATE PF 50 MCG/ML IJ SOSY: 50.0000 ug | PREFILLED_SYRINGE | Freq: Once | INTRAMUSCULAR | Status: AC

## 2022-06-27 MED ORDER — ASPIRIN 81 MG PO CHEW
324.0000 mg | CHEWABLE_TABLET | Freq: Once | ORAL | Status: AC
  Administered 2022-06-27: 324 mg via ORAL
  Filled 2022-06-27: qty 4

## 2022-06-27 MED ORDER — SUCCINYLCHOLINE CHLORIDE 200 MG/10ML IV SOSY
PREFILLED_SYRINGE | INTRAVENOUS | Status: AC
  Filled 2022-06-27: qty 10

## 2022-06-27 MED ORDER — HYDRALAZINE HCL 20 MG/ML IJ SOLN: 10.0000 mg | INTRAMUSCULAR | Status: DC | PRN

## 2022-06-27 MED ORDER — ROCURONIUM BROMIDE 10 MG/ML (PF) SYRINGE
PREFILLED_SYRINGE | INTRAVENOUS | Status: AC
  Filled 2022-06-27: qty 10

## 2022-06-27 MED ORDER — METRONIDAZOLE 500 MG/100ML IV SOLN
500.0000 mg | Freq: Three times a day (TID) | INTRAVENOUS | Status: DC
  Administered 2022-06-27 – 2022-07-01 (×11): 500 mg via INTRAVENOUS
  Filled 2022-06-27 (×11): qty 100

## 2022-06-27 MED ORDER — FENTANYL BOLUS VIA INFUSION
50.0000 ug | INTRAVENOUS | Status: DC | PRN
  Administered 2022-06-27: 100 ug via INTRAVENOUS
  Administered 2022-06-27 (×2): 50 ug via INTRAVENOUS

## 2022-06-27 MED ORDER — LABETALOL HCL 5 MG/ML IV SOLN: 10.0000 mg | INTRAVENOUS | Status: DC | PRN

## 2022-06-27 MED ORDER — FENTANYL CITRATE PF 50 MCG/ML IJ SOSY
PREFILLED_SYRINGE | INTRAMUSCULAR | Status: AC
  Administered 2022-06-27: 50 ug
  Filled 2022-06-27: qty 2

## 2022-06-27 MED ORDER — MIDAZOLAM HCL 2 MG/2ML IJ SOLN
INTRAMUSCULAR | Status: AC
  Administered 2022-06-27: 2 mg via INTRAVENOUS
  Filled 2022-06-27: qty 2

## 2022-06-27 MED ORDER — ACETAMINOPHEN 650 MG RE SUPP: 650.0000 mg | RECTAL | Status: DC | PRN

## 2022-06-27 MED ORDER — DOCUSATE SODIUM 50 MG/5ML PO LIQD
100.0000 mg | Freq: Two times a day (BID) | ORAL | Status: DC
  Administered 2022-06-28 – 2022-06-29 (×3): 100 mg
  Filled 2022-06-27 (×7): qty 10

## 2022-06-27 MED ORDER — ORAL CARE MOUTH RINSE: 15.0000 mL | OROMUCOSAL | Status: DC

## 2022-06-27 MED ORDER — MAGNESIUM SULFATE 2 GM/50ML IV SOLN
2.0000 g | Freq: Once | INTRAVENOUS | Status: AC
  Administered 2022-06-27: 2 g via INTRAVENOUS
  Filled 2022-06-27: qty 50

## 2022-06-27 MED ORDER — MIDAZOLAM HCL 2 MG/2ML IJ SOLN
2.0000 mg | Freq: Once | INTRAMUSCULAR | Status: AC
  Administered 2022-06-27: 2 mg via INTRAVENOUS

## 2022-06-27 MED ORDER — SODIUM CHLORIDE 0.9% FLUSH
10.0000 mL | Freq: Two times a day (BID) | INTRAVENOUS | Status: DC
  Administered 2022-06-28 – 2022-07-01 (×7): 10 mL

## 2022-06-27 MED ORDER — MIDAZOLAM HCL 2 MG/2ML IJ SOLN: 1.0000 mg | INTRAMUSCULAR | Status: DC | PRN

## 2022-06-27 MED ORDER — MIDAZOLAM HCL 2 MG/2ML IJ SOLN
INTRAMUSCULAR | Status: AC
  Filled 2022-06-27: qty 2

## 2022-06-27 MED ORDER — ONDANSETRON HCL 4 MG/2ML IJ SOLN
4.0000 mg | Freq: Four times a day (QID) | INTRAMUSCULAR | Status: DC | PRN
  Administered 2022-06-27: 4 mg via INTRAVENOUS
  Filled 2022-06-27: qty 2

## 2022-06-27 MED ORDER — ETOMIDATE 2 MG/ML IV SOLN
INTRAVENOUS | Status: AC
  Administered 2022-06-27: 20 mg
  Filled 2022-06-27: qty 20

## 2022-06-27 MED ORDER — FUROSEMIDE 10 MG/ML IJ SOLN: 40.0000 mg | Freq: Two times a day (BID) | INTRAMUSCULAR | Status: DC

## 2022-06-27 MED ORDER — FENTANYL 2500MCG IN NS 250ML (10MCG/ML) PREMIX INFUSION
50.0000 ug/h | INTRAVENOUS | Status: DC
  Filled 2022-06-27: qty 250

## 2022-06-27 MED ORDER — ACETAMINOPHEN 325 MG PO TABS: 650.0000 mg | ORAL_TABLET | ORAL | Status: DC | PRN

## 2022-06-27 MED ORDER — FENTANYL 2500MCG IN NS 250ML (10MCG/ML) PREMIX INFUSION
50.0000 ug/h | INTRAVENOUS | Status: DC
  Administered 2022-06-28: 50 ug/h via INTRAVENOUS
  Filled 2022-06-27: qty 250

## 2022-06-27 MED ORDER — PERFLUTREN LIPID MICROSPHERE
1.0000 mL | INTRAVENOUS | Status: AC | PRN
  Administered 2022-06-27: 4 mL via INTRAVENOUS

## 2022-06-27 MED ORDER — FENTANYL CITRATE PF 50 MCG/ML IJ SOSY
PREFILLED_SYRINGE | INTRAMUSCULAR | Status: AC
  Filled 2022-06-27: qty 2

## 2022-06-27 MED ORDER — ENOXAPARIN SODIUM 60 MG/0.6ML IJ SOSY
60.0000 mg | PREFILLED_SYRINGE | Freq: Every day | INTRAMUSCULAR | Status: DC
  Administered 2022-06-27: 60 mg via SUBCUTANEOUS
  Filled 2022-06-27: qty 0.6

## 2022-06-27 MED ORDER — ACETAMINOPHEN 325 MG PO TABS: 650.0000 mg | ORAL_TABLET | Freq: Four times a day (QID) | ORAL | Status: DC | PRN

## 2022-06-27 MED ORDER — SODIUM CHLORIDE 0.9% FLUSH: 10.0000 mL | INTRAVENOUS | Status: DC | PRN

## 2022-06-27 MED ORDER — FUROSEMIDE 10 MG/ML IJ SOLN
80.0000 mg | Freq: Once | INTRAMUSCULAR | Status: AC
  Administered 2022-06-27: 80 mg via INTRAVENOUS
  Filled 2022-06-27: qty 8

## 2022-06-27 MED ORDER — ORAL CARE MOUTH RINSE
15.0000 mL | OROMUCOSAL | Status: DC | PRN
  Administered 2022-06-28: 15 mL via OROMUCOSAL

## 2022-06-27 MED ORDER — FENTANYL BOLUS VIA INFUSION: 50.0000 ug | INTRAVENOUS | Status: DC | PRN

## 2022-06-27 MED ORDER — KETAMINE HCL 50 MG/5ML IJ SOSY
PREFILLED_SYRINGE | INTRAMUSCULAR | Status: AC
  Filled 2022-06-27: qty 10

## 2022-06-27 MED ORDER — SODIUM CHLORIDE 0.9 % IV SOLN
250.0000 mL | INTRAVENOUS | Status: DC | PRN
  Administered 2022-06-28 – 2022-06-29 (×2): 250 mL via INTRAVENOUS

## 2022-06-27 MED ORDER — CHLORHEXIDINE GLUCONATE CLOTH 2 % EX PADS
6.0000 | MEDICATED_PAD | Freq: Every day | CUTANEOUS | Status: DC
  Administered 2022-06-27 – 2022-07-01 (×5): 6 via TOPICAL

## 2022-06-27 MED ORDER — HEPARIN BOLUS VIA INFUSION
2000.0000 [IU] | Freq: Once | INTRAVENOUS | Status: AC
  Administered 2022-06-27: 2000 [IU] via INTRAVENOUS

## 2022-06-27 MED ORDER — FENTANYL 2500MCG IN NS 250ML (10MCG/ML) PREMIX INFUSION: 50.0000 ug/h | INTRAVENOUS | Status: DC

## 2022-06-27 MED ORDER — ORAL CARE MOUTH RINSE: 15.0000 mL | OROMUCOSAL | Status: DC | PRN

## 2022-06-27 MED ORDER — ACETAMINOPHEN 325 MG PO TABS
650.0000 mg | ORAL_TABLET | Freq: Four times a day (QID) | ORAL | Status: DC | PRN
  Administered 2022-06-29: 650 mg
  Filled 2022-06-27: qty 2

## 2022-06-27 MED ORDER — NOREPINEPHRINE 4 MG/250ML-% IV SOLN
0.0000 ug/min | INTRAVENOUS | Status: DC
  Administered 2022-06-27: 3 ug/min via INTRAVENOUS
  Administered 2022-06-28: 5 ug/min via INTRAVENOUS
  Filled 2022-06-27: qty 250

## 2022-06-27 MED ORDER — PROCHLORPERAZINE EDISYLATE 10 MG/2ML IJ SOLN
10.0000 mg | Freq: Four times a day (QID) | INTRAMUSCULAR | Status: DC | PRN
  Filled 2022-06-27: qty 2

## 2022-06-27 MED ORDER — PROPOFOL 1000 MG/100ML IV EMUL
5.0000 ug/kg/min | INTRAVENOUS | Status: DC
  Administered 2022-06-28: 20 ug/kg/min via INTRAVENOUS
  Administered 2022-06-28 – 2022-06-29 (×2): 10 ug/kg/min via INTRAVENOUS
  Filled 2022-06-27 (×3): qty 100

## 2022-06-27 MED ORDER — LEVETIRACETAM IN NACL 1500 MG/100ML IV SOLN
1500.0000 mg | Freq: Two times a day (BID) | INTRAVENOUS | Status: DC
  Administered 2022-06-27 – 2022-07-01 (×8): 1500 mg via INTRAVENOUS
  Filled 2022-06-27 (×10): qty 100

## 2022-06-27 MED ORDER — SODIUM CHLORIDE 0.9% FLUSH: 3.0000 mL | INTRAVENOUS | Status: DC | PRN

## 2022-06-27 MED ORDER — MIDAZOLAM HCL 2 MG/2ML IJ SOLN
1.0000 mg | INTRAMUSCULAR | Status: DC | PRN
  Administered 2022-06-27: 2 mg via INTRAVENOUS

## 2022-06-27 MED ORDER — PROPOFOL 1000 MG/100ML IV EMUL
INTRAVENOUS | Status: AC
  Filled 2022-06-27: qty 100

## 2022-06-27 MED ORDER — SODIUM CHLORIDE 0.9 % IV SOLN
2.0000 g | INTRAVENOUS | Status: DC
  Administered 2022-06-27 – 2022-06-30 (×4): 2 g via INTRAVENOUS
  Filled 2022-06-27 (×4): qty 20

## 2022-06-27 MED ORDER — POLYETHYLENE GLYCOL 3350 17 G PO PACK
17.0000 g | PACK | Freq: Every day | ORAL | Status: DC
  Administered 2022-06-28 – 2022-07-01 (×4): 17 g
  Filled 2022-06-27 (×4): qty 1

## 2022-06-27 MED ORDER — PANTOPRAZOLE SODIUM 40 MG IV SOLR
40.0000 mg | Freq: Every day | INTRAVENOUS | Status: DC
  Administered 2022-06-27 – 2022-06-29 (×3): 40 mg via INTRAVENOUS
  Filled 2022-06-27 (×3): qty 10

## 2022-06-27 NOTE — ED Triage Notes (Signed)
Shortness of breathe worse with activity. Started this morning. Denies chest pain Some swelling in bilateral ankles, normal per pt

## 2022-06-27 NOTE — Progress Notes (Signed)
Ammonia, troponin, co-ox pending at this time. Have s/o to on-call APP L. Ingold to review when able.

## 2022-06-27 NOTE — ED Notes (Signed)
Date and time results received: 06/27/22 0416 (use smartphrase ".now" to insert current time)  Test: troponin Critical Value: 153  Name of Provider Notified: C. Bernette Mayers, MD  Orders Received? Or Actions Taken?:  n/a

## 2022-06-27 NOTE — Consult Note (Addendum)
Cardiology Consultation   Patient ID: Mark Garcia MRN: [id number]; DOB: [birth year]  Admit date: 06/27/2022 Date of Consult: 06/27/2022  PCP:  Patient, No Pcp Per    [facility] Providers Cardiologist:  Peter Swaziland, MD        Patient Profile:   Mark Garcia is a 37 y.o. male with a hx of chronic HFrEF, HTN, morbid obesity, suspected OSA, seizures (prior ED visits for breakthrough, previously followed by neurology at [facility]), pre-DM (A1C 6 in 2021) who is being seen 06/27/2022 for the evaluation of CHF at the request of Dr. Katrinka Blazing.  History of Present Illness:   Mark Garcia was initially admitted in 2021 with new onset systolic HF in the setting of HTN SBP >220. He had not been taking his BP medications previously because of lack of of insurance. 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 He has not had prior ischemic assessment. He has had intermittent issues with medication adherence. For this reason he has not been felt to be a good ICD candidate. Last echo 06/2021 showed EF 30-35%, global HK, mild MR. He was last seen in our office 12/2021 with elevated BP in setting of not taking his medications. He was provided refills. He did not attend the advised 1-2 month f/u. Sleep study previously recommended but he did not have this done.   He presented overnight to the [facility] ED for worsening SOB/DOE for the last few days. History obtained from chart and wife as the patient is encephalopathic and will not stay awake for longer than a few seconds. Per his wife at bedside he has been increasingly lethargic the last week. He was able to go back to work this week [employer]) after being out for a while, but the last few days apparently was barely able to stay awake to keep a conversation before falling asleep. She states he is often falling asleep on the toilet as well. She denies any recent seizure activity. She reports he does not smoke or use drugs, drinks rare ETOH. He  has not had any chest pain. There has been some LE edema. He had not been taking his medicines for several months. Presenting BP 136/111. hsTroponin 153->219->302, BNP 1071, Cr 0.96, K 3.9. CXR mild cardiomegaly with findings suggestive of mild interstitial edema. UDS pending. He has received 324mg  ASA, 4mg  Zofran, Lovenox 60mg  dose then transition to heparin, 80mg  IV Lasix, and pending IV Mg for level of 1.4. VBG was done showing venous pH 7.33, PCO2 60, PO2 70. Patient noted to have rare brady down into the 30s transiently while asleep but rebounds quickly to the 70s-80s, no sustained pauses. Nurse is concerned patient will not be able to protect airway. Apparently he has had some vomiting when he fully awakens. She reports RRT and Dr. Katrinka Blazing have been to the bedside. She reports there was reportedly good UOP by prior staff report before he arrived to this floor. We relayed significant concern for ongoing encephalopathy to Dr. Katrinka Blazing. CT head has been ordered, order updated to stat. ABG has been ordered. Presently requiring 3L [place] to maintain 93%. F/u 2D echo today showed EF 20-25%, moderately dilated LV, G2DD, mild LAE, dilated IVC.    Past Medical History:  Diagnosis Date   Chronic HFrEF (heart failure with reduced ejection fraction) (HCC)    Hypertension    Morbid obesity (HCC)    Seizures (HCC)     Past Surgical History:  Procedure Laterality Date   FINGER AMPUTATION  Left    tip of L index finger>>got caught in a machine     Home Medications:  Prior to Admission medications   Medication Sig Start Date End Date Taking? Authorizing Provider  carvedilol (COREG) 25 MG tablet Take 1 tablet (25 mg total) by mouth 2 (two) times daily with a meal. 01/01/22   Monge, Petra Kuba, NP  empagliflozin (JARDIANCE) 10 MG TABS tablet Take 1 tablet (10 mg total) by mouth daily before breakfast. 02/06/22   [place], Peter M, MD  HYDROcodone-acetaminophen (NORCO/VICODIN) 5-325 MG tablet Take 1 tablet by mouth every 6  (six) hours as needed. 10/31/19   Petrucelli, Samantha R, PA-C  levETIRAcetam (KEPPRA) 500 MG tablet Take 3 tablets (1,500 mg total) by mouth 2 (two) times daily. 01/16/21     levETIRAcetam (KEPPRA) 500 MG tablet Take three tablets (1,500 mg dose) by mouth 2 (two) times daily. 06/26/21     sacubitril-valsartan (ENTRESTO) 97-103 MG Take 1 tablet by mouth 2 (two) times daily. 02/06/22   [place]il[place], Peter M, MD  spironolactone (ALDACTONE) 25 MG tablet Take 1 tablet (25 mg total) by mouth daily. 01/01/22   Joylene Grapes, NP    Inpatient Medications: Scheduled Meds:  furosemide  40 mg Intravenous BID   sodium chloride flush  3 mL Intravenous Q12H   Continuous Infusions:  sodium chloride     heparin 1,450 Units/hr (06/27/22 1333)   magnesium sulfate bolus IVPB     PRN Meds: sodium chloride, acetaminophen, hydrALAZINE, ondansetron (ZOFRAN) IV, perflutren lipid microspheres (DEFINITY) IV suspension, prochlorperazine, sodium chloride flush  Allergies:   No Known Allergies  Social History:   Social History   Socioeconomic History   Marital status: Single    Spouse name: Not on file   Number of children: Not on file   Years of education: Not on file   Highest education level: Not on file  Occupational History   Occupation: Engineer, materials, 2nd shift    Employer: [employer]  Tobacco Use   Smoking status: Former    Types: Cigarettes    Quit date: 02/04/2012    Years since quitting: 10.4   Smokeless tobacco: Never  Substance and Sexual Activity   Alcohol use: No   Drug use: No   Sexual activity: Not on file  Other Topics Concern   Not on file  Social History Narrative   Pt lives with girlfriend.   Social Determinants of Health   Financial Resource Strain: High Risk (10/24/2020)   Overall Financial Resource Strain (CARDIA)    Difficulty of Paying Living Expenses: Hard  Food Insecurity: No Food Insecurity (10/24/2020)   Hunger Vital Sign    Worried About Running Out of Food in the  Last Year: Never true    Ran Out of Food in the Last Year: Never true  Transportation Needs: No Transportation Needs (10/24/2020)   PRAPARE - Administrator, Civil Service (Medical): No    Lack of Transportation (Non-Medical): No  Physical Activity: Not on file  Stress: Not on file  Social Connections: Not on file  Intimate Partner Violence: Not on file    Family History:    Family History  Problem Relation Age of Onset   Scleroderma Mother    Arthritis Mother    Diabetes Maternal Grandmother      ROS:  Please see the history of present illness.   All other ROS reviewed and negative.     Physical Exam/Data:   Vitals:   06/27/22  0715 06/27/22 0816 06/27/22 1121 06/27/22 1150  BP: (!) 157/104  (!) 142/112   Pulse: 78  76   Resp: 20  (!) 22   Temp: (!) 97.5 F (36.4 C)  97.8 F (36.6 C)   TempSrc: Oral  Axillary   SpO2: 93% 96% 95% 93%  Weight: 133.4 kg       Intake/Output Summary (Last 24 hours) at 06/27/2022 1350 Last data filed at 06/27/2022 5621 Gross per 24 hour  Intake --  Output 1980 ml  Net -1980 ml      06/27/2022    7:15 AM 01/01/2022    1:42 PM 05/23/2021    3:25 PM  Last 3 Weights  Weight (lbs) 294 lb 299 lb 9.6 oz 299 lb 3.2 oz  Weight (kg) 133.358 kg 135.898 kg 135.716 kg     Body mass index is 41 kg/m.  General: Obese AAM in no acute distress. Encephalopathic Head: Normocephalic, atraumatic, sclera non-icteric, no xanthomas, nares are without discharge. Neck: Negative for carotid bruits. JVP not elevated. Lungs: Coarse BS with mild increase work of abdominal breathing without wheezing or rhonchi. Not awake enough to comply with inspiratory commands Heart: RRR S1 S2 without murmurs, rubs, or gallops.  Abdomen: Soft, non-tender, non-distended with normoactive bowel sounds. No rebound/guarding. Extremities: No clubbing or cyanosis. Trace BLE edema. Distal pedal pulses are 2+ and equal bilaterally. Neuro: Lethargic, will arouse briefly to  loud voices and name and focus on face, will not answer questions Psych: Lethargic   EKG:  The EKG was personally reviewed and demonstrates:  NSR 81bpm, LVH with suspected repolarization abnormalities, possible LAE with biphasic appearing P wave in V1-V3, QTc . Upsloped ST Segments in V1-V4.  Telemetry:  Telemetry was personally reviewed and demonstrates:  Mainly NSR, two brief episodes of HR 30s transiently before returning to 70s-80s  Relevant CV Studies: 2d echo 06/13/21   1. Left ventricular ejection fraction, by estimation, is 30 to 35%. The  left ventricle has moderately decreased function. The left ventricle  demonstrates global hypokinesis. There is mild concentric left ventricular  hypertrophy. Left ventricular  diastolic parameters are indeterminate.   2. Right ventricular systolic function is normal. The right ventricular  size is normal. Tricuspid regurgitation signal is inadequate for assessing  PA pressure.   3. The mitral valve is normal in structure. Mild mitral valve  regurgitation. No evidence of mitral stenosis.   4. The aortic valve is normal in structure. Aortic valve regurgitation is  not visualized. No aortic stenosis is present.   5. The inferior vena cava is normal in size with greater than 50%  respiratory variability, suggesting right atrial pressure of 3 mmHg.    Laboratory Data:  High Sensitivity Troponin:   Recent Labs  Lab 06/27/22 0319 06/27/22 0440 06/27/22 0919 06/27/22 1112  TROPONINIHS 153* 219* 302* 330*     Chemistry Recent Labs  Lab 06/27/22 0319 06/27/22 0933  NA 139  --   K 3.9  --   CL 103  --   CO2 29  --   GLUCOSE 101*  --   BUN 14  --   CREATININE 0.96  --   CALCIUM 9.5  --   MG  --  1.4*  GFRNONAA >60  --   ANIONGAP 7  --     No results for input(s): "PROT", "ALBUMIN", "AST", "ALT", "ALKPHOS", "BILITOT" in the last 168 hours. Lipids  Recent Labs  Lab 06/27/22 0919  CHOL 129  TRIG 64  HDL 42  LDLCALC 74   CHOLHDL 3.1    Hematology Recent Labs  Lab 06/27/22 0319  WBC 6.9  RBC 4.65  HGB 13.0  HCT 40.0  MCV 86.0  MCH 28.0  MCHC 32.5  RDW 13.0  PLT 199   Thyroid  Recent Labs  Lab 06/27/22 0933  TSH 2.244    BNP Recent Labs  Lab 06/27/22 0319  BNP 1,071.2*    DDimer No results for input(s): "DDIMER" in the last 168 hours.   Radiology/Studies:  [place] EKG SITE RITE  Result Date: 06/27/2022 If Site Rite image not attached, placement could not be confirmed due to current cardiac rhythm.  ECHOCARDIOGRAM COMPLETE  Result Date: 06/27/2022    ECHOCARDIOGRAM REPORT   Patient Name:   Mark Garcia Date of Exam: 06/27/2022 Medical Rec #:  [id number]   Height:       71.0 in Accession #:    [id number]  Weight:       294.0 lb Date of Birth:  04-26-1985   BSA:          2.484 m Patient Age:    37 years    BP:           157/104 mmHg Patient Gender: M           HR:           76 bpm. Exam Location:  Inpatient Procedure: 2D Echo, Cardiac Doppler, Color Doppler and Intracardiac            Opacification Agent Indications:    CHF-Acute Systolic I50.21  History:        Patient has prior history of Echocardiogram examinations, most                 recent 06/12/2021. CHF, Arrythmias:Bradycardia; Risk                 Factors:Hypertension.  Sonographer:    Lucendia Herrlich Referring Phys: [id number] RONDELL A SMITH IMPRESSIONS  1. Left ventricular ejection fraction, by estimation, is 20 to 25%. The left ventricle has severely decreased function. The left ventricle has no regional wall motion abnormalities. The left ventricular internal cavity size was moderately dilated. There  is mild concentric left ventricular hypertrophy. Left ventricular diastolic parameters are consistent with Grade II diastolic dysfunction (pseudonormalization).  2. Right ventricular systolic function is normal. The right ventricular size is normal.  3. Left atrial size was mildly dilated.  4. The mitral valve is normal in structure. Trivial  mitral valve regurgitation. No evidence of mitral stenosis.  5. The aortic valve is normal in structure. Aortic valve regurgitation is not visualized. No aortic stenosis is present.  6. The inferior vena cava is dilated in size with >50% respiratory variability, suggesting right atrial pressure of 8 mmHg. FINDINGS  Left Ventricle: Left ventricular ejection fraction, by estimation, is 20 to 25%. The left ventricle has severely decreased function. The left ventricle has no regional wall motion abnormalities. Definity contrast agent was given IV to delineate the left  ventricular endocardial borders. The left ventricular internal cavity size was moderately dilated. There is mild concentric left ventricular hypertrophy. Left ventricular diastolic parameters are consistent with Grade II diastolic dysfunction (pseudonormalization). Right Ventricle: The right ventricular size is normal. No increase in right ventricular wall thickness. Right ventricular systolic function is normal. Left Atrium: Left atrial size was mildly dilated. Right Atrium: Right atrial size was normal in size. Pericardium: There is no evidence of pericardial effusion. Mitral Valve: The  mitral valve is normal in structure. Trivial mitral valve regurgitation. No evidence of mitral valve stenosis. Tricuspid Valve: The tricuspid valve is not well visualized. Tricuspid valve regurgitation is not demonstrated. No evidence of tricuspid stenosis. Aortic Valve: The aortic valve is normal in structure. Aortic valve regurgitation is not visualized. No aortic stenosis is present. Aortic valve peak gradient measures 6.0 mmHg. Pulmonic Valve: The pulmonic valve was not well visualized. Pulmonic valve regurgitation is not visualized. No evidence of pulmonic stenosis. Aorta: The aortic root is normal in size and structure. Venous: The inferior vena cava is dilated in size with greater than 50% respiratory variability, suggesting right atrial pressure of 8 mmHg.  IAS/Shunts: No atrial level shunt detected by color flow Doppler.  LEFT VENTRICLE PLAX 2D LVIDd:         7.10 cm   Diastology LVIDs:         5.90 cm   LV e' medial:    4.79 cm/s LV PW:         1.60 cm   LV E/e' medial:  25.9 LV IVS:        1.00 cm   LV e' lateral:   6.76 cm/s LVOT diam:     2.30 cm   LV E/e' lateral: 18.3 LV SV:         92 LV SV Index:   37 LVOT Area:     4.15 cm  RIGHT VENTRICLE             IVC RV S prime:     19.80 cm/s  IVC diam: 2.10 cm TAPSE (M-mode): 3.1 cm LEFT ATRIUM             Index        RIGHT ATRIUM           Index LA diam:        5.60 cm 2.25 cm/m   RA Area:     14.40 cm LA Vol (A2C):   91.2 ml 36.72 ml/m  RA Volume:   33.90 ml  13.65 ml/m LA Vol (A4C):   98.7 ml 39.74 ml/m LA Biplane Vol: 98.1 ml 39.50 ml/m  AORTIC VALVE AV Area (Vmax): 3.88 cm AV Vmax:        122.00 cm/s AV Peak Grad:   6.0 mmHg LVOT Vmax:      114.00 cm/s LVOT Vmean:     75.500 cm/s LVOT VTI:       0.222 m  AORTA Ao Root diam: 3.30 cm Ao Asc diam:  3.30 cm MITRAL VALVE MV Area (PHT): 6.71 cm     SHUNTS MV Decel Time: 113 msec     Systemic VTI:  0.22 m MV E velocity: 124.00 cm/s  Systemic Diam: 2.30 cm MV A velocity: 83.10 cm/s MV E/A ratio:  1.49 Aditya Sabharwal Electronically signed by Dorthula Nettles Signature Date/Time: 06/27/2022/1:13:01 PM    Final    DG Chest 2 View  Result Date: 06/27/2022 CLINICAL DATA:  Shortness of breath. EXAM: CHEST - 2 VIEW COMPARISON:  February 17, 2019 FINDINGS: The cardiac silhouette is mildly enlarged and unchanged in size. Mild, diffusely increased lung markings are seen. There is no evidence of focal consolidation, pleural effusion or pneumothorax. The visualized skeletal structures are unremarkable. IMPRESSION: Mild cardiomegaly with findings suggestive of mild interstitial edema. Electronically Signed   By: Aram Candela M.D.   On: 06/27/2022 03:14     Assessment and Plan:   1. Acute encephalopathy, vomiting, acute hypoxic respiratory  failure requiring 3L  to maintain 93% or above - case discussed with Dr. Jacques Navy who also saw patient urgently - this is presently the most concerning issue. He is unable to maintain alertness for more than a second or two and even when awake is not able to converse - relayed concern for ongoing encephalopathy to IM attending, ABG ordered, CT head ordered; nurse reports PCCM also being consulted - lactic acid is normal, appears well perfused on exam, no AKI, + preserved BP, good pulses but given LVEF need to keep cardiogenic etiology in differential - if CT head negative may still need neuro evaluation as severe LVEF dysfunction puts at increased risk for stroke - also suspect severe underlying OSA, untreated - UDS pending - ordered PICC, CVP, Co-ox, ammonia level, LFTs, PT/INR - per d/w Dr. Jacques Navy, I have informed nurse to pause heparin drip pending results of CT in case CVA/bleed are present  2. Acute on chronic HFrEF in setting of medication nonadherence, HTN - received 80mg  IV Lasix in ED, we'll re-dose another now - place foley due to AMS and tracking I/O's and daily weights - PICC, CVP, Co-ox ordered as above - not mentating well enough to take orals at the moment - at some point given elevated troponin and decline in LVEF would benefit from definitive ischemic assessment, possible R/LHC this admission depending on w/u  3. Elevated troponin (NSTEMI versus demand ischemia) - hsTroponin 153-219-302-330, repeat value ordered to peak - see above - relayed to RN to pause heparin for now pending CT report - recommend re-evaluate plan for ASA in AM (got 324mg  today) - hold off BB given acute decompensated HF - consider statin when able to take orals if LFTs ok  4. Hypomagnesemia  - etiology? wife denies excessive ETOH - 2g ordered by primary team/HF pharmacy resident team; spoke with 3e pharmD given need for additional Lasix - per our discussion will order another 2g now - check Mg level at 6pm, will s/o to on  call team to follow  5. Sinus bradycardia - two episodes noted while asleep, transient into the 30s which supports dx of OSA - hold off adding back BB pending HR trends   Risk Assessment/Risk Scores:     TIMI Risk Score for Unstable Angina or Non-ST Elevation MI:   The patient's TIMI risk score is  , which indicates a  % risk of all cause mortality, new or recurrent myocardial infarction or need for urgent revascularization in the next 14 days.  New York Heart Association (NYHA) Functional Class NYHA Class IV  For questions or updates, please contact  [facility] Please consult [email or web address] for contact info under    Signed, Laurann Montana, PA-C  06/27/2022 1:50 PM  Patient seen and examined with Ronie Spies, PA-C.  Agree as above, with the following exceptions and changes as noted below.  37 year old male with heart failure with reduced ejection fraction and medication noncompliance as well as untreated OSA who presents with altered mental status/encephalopathy, cardiology consulted for heart failure.  Patient's wife is in the room and provides the history as the patient is clearly somnolent and at times responds only after sternal rub.  He does answer appropriately though is extremely somnolent.    Gen: somnolent, CV: RRR, no murmurs, warm hands cool feet, pt pulse 4/4 bilaterally, Lungs: clear, Abd: soft, Extrem: no edema, Neuro/Psych: somnolent, arousable All available labs, radiology testing, previous records reviewed.  Patient has no obvious signs of cardiogenic shock at  this time despite severely reduced ejection fraction and medication noncompliance.  Lactate is normal, renal function preserved, periphery warm.  Question of altered mental status needs to be assessed from a noncardiac standpoint as well, recommend head CT and ABG.  Patient has untreated sleep apnea and also has a seizure disorder, these remain on the differential as contributors. PCCM consulted by primary  service.  Patient was started on heparin given elevated troponins and no prior ischemic evaluation available, if no concern of intracranial bleeding, ok to continue for 48 hours, though suspicion of ACS low, likely demand ischemia in setting of heart failure.   Parke Poisson, MD 06/27/22 2:37 PM

## 2022-06-27 NOTE — Progress Notes (Addendum)
ANTICOAGULATION CONSULT NOTE  Pharmacy Consult for Heparin Indication: chest pain/ACS  No Known Allergies  Patient Measurements: Height: 5\' 11"  (180.3 cm) Weight: 133.4 kg (294 lb) IBW/kg (Calculated) : 75.3 Heparin Dosing Weight: 98 kg  Vital Signs: Temp: 97.8 F (36.6 C) (05/24 1121) Temp Source: Axillary (05/24 1121) BP: 137/87 (05/24 1845) Pulse Rate: 73 (05/24 1845)  Labs: Recent Labs    06/27/22 0319 06/27/22 0440 06/27/22 0919 06/27/22 1112 06/27/22 1800  HGB 13.0  --   --   --   --   HCT 40.0  --   --   --   --   PLT 199  --   --   --   --   APTT  --   --   --   --  47*  CREATININE 0.96  --   --   --   --   TROPONINIHS 153*   < > 302* 330* 591*   < > = values in this interval not displayed.     Estimated Creatinine Clearance: 143.9 mL/min (by C-G formula based on SCr of 0.96 mg/dL).   Medical History: Past Medical History:  Diagnosis Date   Chronic HFrEF (heart failure with reduced ejection fraction) (HCC)    Hypertension    Morbid obesity (HCC)    Seizures (HCC)     Assessment: 37 YOM not on anticoagulation PTA who presented with SOB with troponins trending up. Pharmacy consulted to manage heparin for ACS.  CBC stable, received x1 dose enoxaparin 60mg  SQ @1030  on 5/24  aPTT 47, subtherapeutic, drawn ~5 hours after initiating heparin infusion Current heparin infusion rate: 1450 units/hr No s/sx of bleeding reported per RN  Goal of Therapy:  Heparin level 0.3-0.7 units/ml aPTT 66-102 seconds Monitor platelets by anticoagulation protocol: Yes   Plan:  Increase heparin infusion rate to 1800 units/hr Check aPTT and heparin level in 6 hours Monitor daily CBC, aPTT and heparin levels until correlating, and for s/sx of bleeding   Thank you for allowing pharmacy to be a part of this patient's care.  Wilburn Cornelia, PharmD, BCPS Clinical Pharmacist 06/27/2022 7:22 PM   Please refer to AMION for pharmacy phone number

## 2022-06-27 NOTE — Progress Notes (Addendum)
eLink Physician-Brief Progress Note Patient Name: Mark Garcia DOB: 1985-03-11 MRN: [id number]   Date of Service  06/27/2022  HPI/Events of Note  37 year old male with a history of essential hypertension, seizures, and heart failure who initially presented with elevated troponins after being out of medications for a week, progressively more dyspneic and bradycardic when sleeping eventually leading to lethargy.  He was placed on BiPAP but ultimately required intubation.  Roughly an hour after being intubated, the patient had a sudden onset of respiratory distress, was extremely anxious, trying to pull out his ET tube and persistently hypoxic.    eICU Interventions  Received total of 4 mg of Versed for his agitation, propofol initiated with RASS goal -2  PEEP was escalated from 5 to 8 and eventually to 10.   Wrist restraints were utilized  Chest radiograph was ordered consistent with left-sided infiltrates, relatively stable ET tube in appropriate positioning  Remained persistently hypoxic, improved with repositioning to have a right-side down.   Add on PRN antihypertensives   2042 - Add Norepi in the setting of shock 0105 -  foley not draining.  Tried irrigating, however, saline not flowing past port.  Bladder scan >500 ml; Replace foley  0457 - K 2.7, Cr 1.44, trig 442 - on prop gtt and can maintain for the time being. Will add kcl   Intervention Category Major Interventions: Respiratory failure - evaluation and management  Roselina Burgueno 06/27/2022, 7:45 PM

## 2022-06-27 NOTE — Procedures (Signed)
Intubation Procedure Note  Mark Garcia  [id number]  08-22-1985  Date:06/27/22  Time:6:26 PM   Provider Performing:Izear Pine    Procedure: Intubation (31500)  Indication(s) Respiratory Failure  Consent Risks of the procedure as well as the alternatives and risks of each were explained to the patient and/or caregiver.  Consent for the procedure was obtained and is signed in the bedside chart   Anesthesia Etomidate, Versed, Fentanyl, and Rocuronium   Time Out Verified patient identification, verified procedure, site/side was marked, verified correct patient position, special equipment/implants available, medications/allergies/relevant history reviewed, required imaging and test results available.   Sterile Technique Usual hand hygeine, masks, and gloves were used   Procedure Description Patient positioned in bed supine.  Sedation given as noted above.  Patient was intubated with endotracheal tube using Glidescope.  View was Grade 1 full glottis .  Number of attempts was 1.  Colorimetric CO2 detector was consistent with tracheal placement.   Complications/Tolerance None; patient tolerated the procedure well. Chest X-ray is ordered to verify placement.   EBL Minimal   Specimen(s) None  Coralyn Helling, MD [facility] Pulmonary/Critical Care Pager - [phone] or [phone] 06/27/2022, 6:26 PM

## 2022-06-27 NOTE — Progress Notes (Signed)
Peripherally Inserted Central Catheter Placement  The IV Nurse has discussed with the patient and/or persons authorized to consent for the patient, the purpose of this procedure and the potential benefits and risks involved with this procedure.  The benefits include less needle sticks, lab draws from the catheter, and the patient may be discharged home with the catheter. Risks include, but not limited to, infection, bleeding, blood clot (thrombus formation), and puncture of an artery; nerve damage and irregular heartbeat and possibility to perform a PICC exchange if needed/ordered by physician.  Alternatives to this procedure were also discussed.  Bard Power PICC patient education guide, fact sheet on infection prevention and patient information card has been provided to patient /or left at bedside.    PICC Placement Documentation  PICC Triple Lumen 06/27/22 Right Brachial 44 cm 1 cm (Active)  Indication for Insertion or Continuance of Line Chronic illness with exacerbations (CF, Sickle Cell, etc.) 06/27/22 1553  Exposed Catheter (cm) 1 cm 06/27/22 1553  Site Assessment Clean, Dry, Intact 06/27/22 1553  Lumen #1 Status Flushed;Saline locked;Blood return noted 06/27/22 1553  Lumen #2 Status Flushed;Saline locked;Blood return noted 06/27/22 1553  Lumen #3 Status Flushed;Saline locked;Blood return noted 06/27/22 1553  Dressing Type Transparent;Securing device 06/27/22 1553  Dressing Status Antimicrobial disc in place 06/27/22 1553  Safety Lock Not Applicable 06/27/22 1553  Line Care Connections checked and tightened 06/27/22 1553  Line Adjustment (NICU/IV Team Only) No 06/27/22 1553  Dressing Intervention New dressing 06/27/22 1553  Dressing Change Due 07/04/22 06/27/22 1553       Mark Garcia  [name] 06/27/2022, 3:55 PM

## 2022-06-27 NOTE — Progress Notes (Signed)
Echocardiogram 2D Echocardiogram has been performed.  Lucendia Herrlich 06/27/2022, 12:57 PM

## 2022-06-27 NOTE — H&P (Addendum)
History and Physical    Patient: Mark Garcia IDP:[id number] DOB: 28-Sep-1985 DOA: 06/27/2022 DOS: the patient was seen and examined on 06/27/2022 PCP: Patient, No Pcp Per  Patient coming from: Transfer from [facility] emergency department  Chief Complaint:  Chief Complaint  Patient presents with   Shortness of Breath   HPI: Mark Garcia is a 37 y.o. male with medical history significant of hypertension, CHF last EF 30-30%, seizure disorder, and morbid obesity who presents with complaints of shortness of breath which started yesterday.  History is somewhat limited at this time due as patient repeatedly falls asleep during evaluation.  He had been out of all of his medications for a few months.  Patient reported that he had started feeling short of breath yesterday while he was at work overnight.  He had not had any chest pain or significant cough.  He reports having nausea, vomiting, and diaphoresis since getting to the hospital.  Patient admits to smoking marijuana intermittently, but last use was 2 - 3 days ago.  He does not smoke cigarettes or drink alcohol.  Additional history obtained from his spouse was present at bedside.  Apparently patient had not been taking any of his medications for several months, but had restarted back on some of his medications for heart failure last week.  He also has a history of seizure disorder for which she is supposed to be on Keppra, but had not been taking that either in several months.  He had recently started a new third shift job and wife notes that he had been more lethargic over the last few days.  In the emergency department patient was noted to be afebrile with pulse reported to drop down as low as 33 while patient sleeping, respirations 15-36, blood pressures 129/98-157/104, and O2 saturations maintained on room air.  Labs significant for BNP 1071.2 and high-sensitivity troponin 153-> 219.  Chest x-ray noted mild cardiomegaly with mild interstitial  edema.  Patient had been given full dose aspirin and Lasix 40 mg IV.  Review of Systems: As mentioned in the history of present illness. All other systems reviewed and are negative. Past Medical History:  Diagnosis Date   CHF (congestive heart failure) (HCC)    Hypertension    Seizures (HCC)    Past Surgical History:  Procedure Laterality Date   FINGER AMPUTATION Left    tip of L index finger>>got caught in a machine   Social History:  reports that he quit smoking about 10 years ago. His smoking use included cigarettes. He has never used smokeless tobacco. He reports that he does not drink alcohol and does not use drugs.  No Known Allergies  Family History  Problem Relation Age of Onset   Scleroderma Mother    Arthritis Mother    Diabetes Maternal Grandmother     Prior to Admission medications   Medication Sig Start Date End Date Taking? Authorizing Provider  carvedilol (COREG) 25 MG tablet Take 1 tablet (25 mg total) by mouth 2 (two) times daily with a meal. 01/01/22   Monge, Petra Kuba, NP  empagliflozin (JARDIANCE) 10 MG TABS tablet Take 1 tablet (10 mg total) by mouth daily before breakfast. 02/06/22   [place], Peter M, MD  HYDROcodone-acetaminophen (NORCO/VICODIN) 5-325 MG tablet Take 1 tablet by mouth every 6 (six) hours as needed. 10/31/19   Petrucelli, Samantha R, PA-C  levETIRAcetam (KEPPRA) 500 MG tablet Take 3 tablets (1,500 mg total) by mouth 2 (two) times daily. 01/16/21  levETIRAcetam (KEPPRA) 500 MG tablet Take three tablets (1,500 mg dose) by mouth 2 (two) times daily. 06/26/21     sacubitril-valsartan (ENTRESTO) 97-103 MG Take 1 tablet by mouth 2 (two) times daily. 02/06/22   [place], Peter M, MD  spironolactone (ALDACTONE) 25 MG tablet Take 1 tablet (25 mg total) by mouth daily. 01/01/22   Joylene Grapes, NP    Physical Exam: Vitals:   06/27/22 0543 06/27/22 0545 06/27/22 0600 06/27/22 0715  BP:  (!) 134/110 (!) 136/95 (!) 157/104  Pulse: 82 78 75 78  Resp: 20 20 20  20   Temp:    (!) 97.5 F (36.4 C)  TempSrc:    Oral  SpO2: 94% 93% 98% 93%  Weight:    133.4 kg    Constitutional: Morbidly obese man who appears to be in acute distress actively vomiting Eyes: PERRL, lids and conjunctivae normal ENMT: Mucous membranes are moist.   Neck: normal, supple, JVD present Respiratory: Patient noted to be mildly tachypneic at this time with decreased overall aeration.  No significant wheezes appreciated.  Currently on 2 L nasal cannula oxygen with O2 saturation maintained Cardiovascular: Regular rate and rhythm, no murmurs / rubs / gallops.  At least +1 pitting bilateral edema. 2+ pedal pulses.  Abdomen: no tenderness, no masses palpated. No hepatosplenomegaly. Bowel sounds positive.  Musculoskeletal: no clubbing / cyanosis. No joint deformity upper and lower extremities. Good ROM, no contractures. Normal muscle tone.  Skin: no rashes, lesions, ulcers.  Diaphoretic Neurologic: CN 2-12 grossly intact. Strength 5/5 in all 4.  Psychiatric: Lethargic, but oriented to person place and circumstance. Data Reviewed:  EKG reveals sinus rhythm 81 bpm with prolonged PR interval, abnormal R wave progression of atrial abnormality, and LVH.  Reviewed labs, imaging, and pertinent records as noted.  Assessment and Plan:  Heart failure with reduced EF Acute on chronic.  Patient presents with complaints of shortness of breath that started yesterday.  He had not been taking any of his medication for at least a couple of months.  On physical exam patient with lower extremity swelling.  BNP elevated at 1071.2.  Chest x-ray showed mild cardiomegaly with mild interstitial edema.  Patient has been given Lasix 80 mg IV x 1 dose. -Admit to a cardiac telemetry bed -Heart failure order set utilized -Strict I&O's and daily weights -Add on TSH -Check echocardiogram -Continue Lasix 40 mg IV twice daily.  Reassess and adjust diuresis as needed -Held beta-blocker due to acute decompensation  and bradycardia -Cardiology consulted, will follow-up for any further recommendations  Acute metabolic encephalopathy Acute respiratory failure with hypoxia Patient was noted to be lethargic.  During sleep patient noted to have a drop in O2 saturations down to 88% for which he was placed on 2 L of nasal cannula oxygen.  No seizure-like activity appreciated at this time.  Unclear exact cause of patient's lethargy at this time. -Continuous pulse oximetry with nasal cannula oxygen maintain O2 saturation greater than 92% -Check COVID and respiratory virus panel -Check venous bld gas -Check urine drug screen  Elevated troponin High-sensitivity troponin 153-> 219. He denied any complaints of chest pain, but was noted to be diaphoretic with nausea and vomiting.  Possibly secondary to demand in the setting of patient being acutely fluid overloaded. -Continue to trend cardiac troponins  Nausea and vomiting Patient noted to have nausea and vomiting while in the ED. -Aspiration precautions with elevation head of the bed -N.p.o.   Essential hypertension Blood pressures initially elevated  up to 153/104.  Patient had not been on home blood pressure medications in some time. -Did not resume beta-blocker given reports of bradycardia -Hydralazine IV as needed  Bradycardia While sleeping patient's heart rates reported to have dropped down as low as 30-40 on telemetry monitoring, but was otherwise asymptomatic. -Check magnesium level -Follow-up telemetry  Seizure disorder Patient has history of seizure disorder, but had not been taking medications in several months.  No seizure-like activity appreciated. -Seizure precautions -Check EEG -Resume Keppra 1500 mg IV BID  Morbid obesity BMI 41 kg/m.  Suspect patient likely to have sleep apnea. -Needs referral for outpatient sleep study   DVT prophylaxis: Lovenox Advance Care Planning:   Code Status: Full Code    Consults: Cardiology  Family  Communication: Attempted to contact patient's significant other over the phone no voicemail associated with number.  Severity of Illness: The appropriate patient status for this patient is INPATIENT. Inpatient status is judged to be reasonable and necessary in order to provide the required intensity of service to ensure the patient's safety. The patient's presenting symptoms, physical exam findings, and initial radiographic and laboratory data in the context of their chronic comorbidities is felt to place them at high risk for further clinical deterioration. Furthermore, it is not anticipated that the patient will be medically stable for discharge from the hospital within 2 midnights of admission.   * I certify that at the point of admission it is my clinical judgment that the patient will require inpatient hospital care spanning beyond 2 midnights from the point of admission due to high intensity of service, high risk for further deterioration and high frequency of surveillance required.*  Author: Clydie Braun, MD 06/27/2022 8:01 AM  For on call review [email or web address].

## 2022-06-27 NOTE — Progress Notes (Signed)
    Pt has been intubated.  CCM following, ABX have been added - ammonia level 33.   Hs troponin up to 591 Mg+ and co-ox have not been drawn yet.  Have reported to Fellow to follow.   Nada Boozer, FNP-C [facility]  Pgr:[phone] or after 5pm and on weekends call [phone] 06/27/2022.now

## 2022-06-27 NOTE — ED Notes (Signed)
[facility] here for pt transport 

## 2022-06-27 NOTE — Progress Notes (Signed)
ANTICOAGULATION CONSULT NOTE  Pharmacy Consult for Heparin Indication: chest pain/ACS  No Known Allergies  Patient Measurements: Weight: 133.4 kg (294 lb) Heparin Dosing Weight: 98 kg  Vital Signs: Temp: 97.5 F (36.4 C) (05/24 0715) Temp Source: Oral (05/24 0715) BP: 157/104 (05/24 0715) Pulse Rate: 78 (05/24 0715)  Labs: Recent Labs    06/27/22 0319 06/27/22 0440 06/27/22 0919  HGB 13.0  --   --   HCT 40.0  --   --   PLT 199  --   --   CREATININE 0.96  --   --   TROPONINIHS 153* 219* 302*    Estimated Creatinine Clearance: 143.9 mL/min (by C-G formula based on SCr of 0.96 mg/dL).   Medical History: Past Medical History:  Diagnosis Date   CHF (congestive heart failure) (HCC)    Hypertension    Seizures (HCC)     Assessment: 37 YOM not on anticoagulation PTA who presented with SOB with troponins trending up. Pharmacy consulted to manage heparin for ACS.  CBC stable, received x1 dose enoxaparin 60mg  SQ @1030 . Will give modified loading dose.  Goal of Therapy:  Heparin level 0.3-0.7 units/ml Monitor platelets by anticoagulation protocol: Yes   Plan:  Give heparin 2000mg  IV x1 Start  heparin infusion at 1450 units/hr Check heparin level in 6 hours and daily while on heparin Continue to monitor H&H and platelets    Thank you for allowing pharmacy to be a part of this patient's care.  Thelma Barge, PharmD Clinical Pharmacist

## 2022-06-27 NOTE — ED Notes (Signed)
Report called to [facility], nurse on accepting unit. Awaiting [facility] for transport.

## 2022-06-27 NOTE — Progress Notes (Signed)
   Patient Name: Mark Garcia, Mark Garcia DOB: 12/25/1985 MRN: [id number] Transferring facility: [facility] Requesting provider: sheldon, md Reason for transfer:  37 yo AAM with hx of obesity, CHF, HTN. noncompliant with meds. 24 hours of SOB. BNP 1071. trop 153. no chest pain. mild pulmonary edema. no hypoxic. Cardiology fellow consulted by declined admission. Going to: [facility]  Admission Status: observation Bed Type: card tele To Do:  [facility] will assume care on arrival to accepting facility. Until arrival, medical decision making responsibilities remain with the EDP.  However, [facility] available 24/7 for questions and assistance.   Nursing staff please page [facility] Admits and Consults [phone]) as soon as the patient arrives to the hospital.  Carollee Herter, DO [facility]

## 2022-06-27 NOTE — Significant Event (Signed)
Rapid Response Event Note   Reason for Call :  Lethargy  Initial Focused Assessment:  Pt sitting up in bed. Vomiting. RN reports multiple episodes of emesis, despite having received 4mg  IV Zofran at 0841. Pt restless. Skin is warm, moist, pink. He is oriented x4. PERRLA, 5mm. Abdomen distended over stomach. Pt endorses abdominal cramping. He states his last bowel movement was 1-2 days ago. Breathing unlabored.  Pt also endorses cramping in his legs. Pt denies pain.   ABG at 0933:   7.33/ 60/ 70/ 31.9   Pt unable to use BiPAP d/t nausea & vomiting  VS: T 97.72F, BP 142/112, HR 76, RR 22, SpO2 93% on 3LNC  Interventions:  No intervention from RR RN  Plan of Care:  -PRN Compazine and Zofran -KUB  Call rapid response for additional needs  Event Summary:  MD Notified: Dr. Arlyss Queen, per primary RN Call Time: 1138 Arrival Time: 1140 End Time: 1200  Jennye Moccasin, RN

## 2022-06-27 NOTE — ED Provider Notes (Signed)
[place] EMERGENCY DEPARTMENT AT [facility]  Provider Note  CSN: [id number] Arrival date & time: 06/27/22 0242  History Chief Complaint  Patient presents with   Shortness of Breath    Mark Garcia is a 36 y.o. male with history of poorly controlled HTN and CHF (cardiomyopathy is presumably due to HTN) presents for evaluation of SOB and DOE worsening through the last day. He admits he was out of all of his medications for a few months, restarted them about a week ago. Denies CP, has had some edema, about at baseline. No cough or fever   Home Medications Prior to Admission medications   Medication Sig Start Date End Date Taking? Authorizing Provider  carvedilol (COREG) 25 MG tablet Take 1 tablet (25 mg total) by mouth 2 (two) times daily with a meal. 01/01/22   Monge, Petra Kuba, NP  empagliflozin (JARDIANCE) 10 MG TABS tablet Take 1 tablet (10 mg total) by mouth daily before breakfast. 02/06/22   [place], Peter M, MD  HYDROcodone-acetaminophen (NORCO/VICODIN) 5-325 MG tablet Take 1 tablet by mouth every 6 (six) hours as needed. 10/31/19   Petrucelli, Samantha R, PA-C  levETIRAcetam (KEPPRA) 500 MG tablet Take 3 tablets (1,500 mg total) by mouth 2 (two) times daily. 01/16/21     levETIRAcetam (KEPPRA) 500 MG tablet Take three tablets (1,500 mg dose) by mouth 2 (two) times daily. 06/26/21     sacubitril-valsartan (ENTRESTO) 97-103 MG Take 1 tablet by mouth 2 (two) times daily. 02/06/22   [place], Peter M, MD  spironolactone (ALDACTONE) 25 MG tablet Take 1 tablet (25 mg total) by mouth daily. 01/01/22   Joylene Grapes, NP     Allergies    Patient has no known allergies.   Review of Systems   Review of Systems Please see HPI for pertinent positives and negatives  Physical Exam BP (!) 143/101   Pulse 78   Temp 98.6 F (37 C) (Oral)   Resp 15   SpO2 94%   Physical Exam Vitals and nursing note reviewed.  Constitutional:      General: He is not in acute distress.     Appearance: Normal appearance. He is not diaphoretic.  HENT:     Head: Normocephalic and atraumatic.     Nose: Nose normal.     Mouth/Throat:     Mouth: Mucous membranes are moist.  Eyes:     Extraocular Movements: Extraocular movements intact.     Conjunctiva/sclera: Conjunctivae normal.  Neck:     Vascular: No JVD.  Cardiovascular:     Rate and Rhythm: Normal rate.  Pulmonary:     Effort: Pulmonary effort is normal.     Breath sounds: Decreased breath sounds and rales present.  Abdominal:     General: Abdomen is flat.     Palpations: Abdomen is soft.     Tenderness: There is no abdominal tenderness.  Musculoskeletal:        General: No swelling. Normal range of motion.     Cervical back: Neck supple.     Right lower leg: Edema (trace) present.     Left lower leg: Edema (trace) present.  Skin:    General: Skin is warm and dry.  Neurological:     General: No focal deficit present.     Mental Status: He is alert.  Psychiatric:        Mood and Affect: Mood normal.     ED Results / Procedures / Treatments   EKG EKG Interpretation  Date/Time:  Friday Jun 27 2022 03:00:24 EDT Ventricular Rate:  81 PR Interval:  216 QRS Duration: 105 QT Interval:  391 QTC Calculation: 454 R Axis:   -7 Text Interpretation: Sinus rhythm Prolonged PR interval LAE, consider biatrial enlargement Abnormal R-wave progression, late transition Left ventricular hypertrophy Abnormal T, consider ischemia, lateral leads Anterior ST elevation, probably due to LVH No significant change since last tracing Confirmed by Susy Frizzle [phone]) on 06/27/2022 3:03:08 AM  Procedures .Critical Care  Performed by: Pollyann Savoy, MD Authorized by: Pollyann Savoy, MD   Critical care provider statement:    Critical care time (minutes):  40   Critical care time was exclusive of:  Separately billable procedures and treating other patients   Critical care was necessary to treat or prevent imminent or  life-threatening deterioration of the following conditions:  Cardiac failure   Critical care was time spent personally by me on the following activities:  Development of treatment plan with patient or surrogate, discussions with consultants, evaluation of patient's response to treatment, examination of patient, ordering and review of laboratory studies, ordering and review of radiographic studies, ordering and performing treatments and interventions, pulse oximetry, re-evaluation of patient's condition and review of old charts   Care discussed with: admitting provider     Medications Ordered in the ED Medications  furosemide (LASIX) injection 80 mg (80 mg Intravenous Given 06/27/22 0420)  aspirin chewable tablet 324 mg (324 mg Oral Given 06/27/22 0425)    Initial Impression and Plan  Patient here with SOB and DOE in setting of medication non compliance and dietary indiscretion with history of HTN and CHF. Does not appear in acute distress. Will check labs, CXR.   ED Course   Clinical Course as of 06/27/22 0445  Fri Jun 27, 2022  0320 I personally viewed the images from radiology studies and agree with radiologist interpretation:  CXR with cardiomegaly and vascular congestion, similar to previous, 2001 [CS]  0350 CBC is normal.  [CS]  0356 BMP is normal.  [CS]  0410 BNP is elevated, consistent with suspected CHF.  [CS]  [id number] Initial Trop is elevated, will give a dose of ASA, lasix for CHF. Will need admission for diuresis and further trending of Trop.  [CS]  [phone] Spoke with Dr. Geraldo Pitter, Cardiology who agrees with plan to admit to Hospitalist for diuresis.  [CS]  [phone] Spoke with Dr. Imogene Burn, Hospitalist, who will accept for admission.  [CS]    Clinical Course User Index [CS] Pollyann Savoy, MD     MDM Rules/Calculators/A&P Medical Decision Making Problems Addressed: Acute on chronic systolic congestive heart failure [facility]): chronic illness or injury with exacerbation, progression, or  side effects of treatment Elevated troponin I level: acute illness or injury  Amount and/or Complexity of Data Reviewed Labs: ordered. Decision-making details documented in ED Course. Radiology: ordered and independent interpretation performed. Decision-making details documented in ED Course. ECG/medicine tests: ordered and independent interpretation performed. Decision-making details documented in ED Course.  Risk OTC drugs. Prescription drug management. Decision regarding hospitalization.     Final Clinical Impression(s) / ED Diagnoses Final diagnoses:  Acute on chronic systolic congestive heart failure (HCC)  Elevated troponin I level    Rx / DC Orders ED Discharge Orders     None        Pollyann Savoy, MD 06/27/22 [phone]

## 2022-06-27 NOTE — ED Notes (Signed)
Patient resting quietly in stretcher, respirations even, unlabored, no acute distress noted. Denies needs at this time.  

## 2022-06-27 NOTE — Progress Notes (Signed)
Patient was transported to 2H13 on BIPAP without any complications. 2H RT is aware.

## 2022-06-27 NOTE — Progress Notes (Addendum)
After transfer to ICU mental status got worse again even though he was back on Bipap.  Family informed about change and need for intubation.  Pt intubated without difficulty.  Coralyn Helling, MD [facility] Pulmonary/Critical Care Pager - [phone] or [phone] 06/27/2022, 6:28 PM   Post intubation CXR showed increasing infiltrates.  Check sputum culture.  Add rocephin and flagyl.  Coralyn Helling, MD [facility] Pulmonary/Critical Care Pager - [phone] or [phone] 06/27/2022, 6:41 PM

## 2022-06-27 NOTE — Consult Note (Signed)
NAME:  Mark Garcia, MRN:  [id number], DOB:  19-Feb-1985, LOS: 0 ADMISSION DATE:  06/27/2022, CONSULTATION DATE:  06/27/2022 REFERRING MD:  Dr. Katrinka Blazing, [facility], CHIEF COMPLAINT:  AMS   History of Present Illness:  37 yo male presented to ER with dyspnea and leg swelling.  Hx of HFrEF, HTN, Seizures but wasn't able to take medications consistently.  Had elevated troponin.  Started on lasix and cardiology consulted.  Hd nausea with vomiting.  Developed lethargy.  VBG showed hypercapnia.  PCCM consulted and transferred to ICU.  Pertinent  Medical History  HTN, HFrEF, Seizures  Significant Hospital Events: Including procedures, antibiotic start and stop dates in addition to other pertinent events   5/24 Admit, cardiology consult, transfer to ICU and start Bipap  Interim History / Subjective:    Objective   Blood pressure (!) 142/112, pulse 73, temperature 97.8 F (36.6 C), temperature source Axillary, resp. rate (!) 22, weight 133.4 kg, SpO2 99 %.    FiO2 (%):  [40 %] 40 %   Intake/Output Summary (Last 24 hours) at 06/27/2022 1458 Last data filed at 06/27/2022 2130 Gross per 24 hour  Intake --  Output 1980 ml  Net -1980 ml   Filed Weights   06/27/22 0715  Weight: 133.4 kg    Examination:  General - somnolent Eyes - pupils reactive ENT - no sinus tenderness, no stridor Cardiac - regular rate/rhythm, no murmur Chest - equal breath sounds b/l, no wheezing or rales Abdomen - soft, non tender, + bowel sounds Extremities - 1+ edema Skin - no rashes Neuro - wakes up with brisk stimulation, able to state his name and location, moves all extremities   Resolved Hospital Problem list     Assessment & Plan:   Acute metabolic encephalopathy mostly likely related to hypercapnia in setting of sleep disordered breathing. Hx of seizures. - monitor mental status in ICU - defer CT head imaging for now since no focal neuro findings to suggest a stroke >> if mental status doesn't improve with  Bipap, then consider neuro-imaging and EEG - f/u UDS  Acute on chronic hypoxic/hypercapnic respiratory failure with presumed obstructive sleep apnea and obesity hypoventilation syndrome, and interstitial edema. - transfer to ICU - start Bipap - monitor need for intubation - goal SpO2 90 to 95% - will need outpt sleep assessment  Acute on chronic combined CHF. Elevated troponin from demand ischemia. Hx of HTN. - Echo 06/27/22 >> EF 20 to 25%, grade 2 DD - cardiology consulted  Hypomagnesemia. - f/u labs  Best Practice (right click and "Reselect all SmartList Selections" daily)   Diet/type: NPO DVT prophylaxis: systemic heparin GI prophylaxis: N/A Lines: N/A Foley:  N/A Code Status:  full code Last date of multidisciplinary goals of care discussion [updated his wife at bedside]  Labs   CBC: Recent Labs  Lab 06/27/22 0319  WBC 6.9  NEUTROABS 4.1  HGB 13.0  HCT 40.0  MCV 86.0  PLT 199    Basic Metabolic Panel: Recent Labs  Lab 06/27/22 0319 06/27/22 0933  NA 139  --   K 3.9  --   CL 103  --   CO2 29  --   GLUCOSE 101*  --   BUN 14  --   CREATININE 0.96  --   CALCIUM 9.5  --   MG  --  1.4*   GFR: Estimated Creatinine Clearance: 143.9 mL/min (by C-G formula based on SCr of 0.96 mg/dL). Recent Labs  Lab 06/27/22 0319 06/27/22 1230  WBC 6.9  --   LATICACIDVEN  --  1.2    Liver Function Tests: No results for input(s): "AST", "ALT", "ALKPHOS", "BILITOT", "PROT", "ALBUMIN" in the last 168 hours. No results for input(s): "LIPASE", "AMYLASE" in the last 168 hours. No results for input(s): "AMMONIA" in the last 168 hours.  ABG    Component Value Date/Time   HCO3 31.9 (H) 06/27/2022 0933   O2SAT 94.6 06/27/2022 0933     Coagulation Profile: No results for input(s): "INR", "PROTIME" in the last 168 hours.  Cardiac Enzymes: No results for input(s): "CKTOTAL", "CKMB", "CKMBINDEX", "TROPONINI" in the last 168 hours.  HbA1C: Hgb A1c MFr Bld  Date/Time  Value Ref Range Status  02/18/2019 04:07 AM 6.0 (H) 4.8 - 5.6 % Final    Comment:    (NOTE) Pre diabetes:          5.7%-6.4% Diabetes:              >6.4% Glycemic control for   <7.0% adults with diabetes     CBG: Recent Labs  Lab 06/27/22 0844  GLUCAP 129*    Review of Systems:   Unable to obtain  Past Medical History:  He,  has a past medical history of Chronic HFrEF (heart failure with reduced ejection fraction) (HCC), Hypertension, Morbid obesity (HCC), and Seizures (HCC).   Surgical History:   Past Surgical History:  Procedure Laterality Date   FINGER AMPUTATION Left    tip of L index finger>>got caught in a machine     Social History:   reports that he quit smoking about 10 years ago. His smoking use included cigarettes. He has never used smokeless tobacco. He reports that he does not drink alcohol and does not use drugs.   Family History:  His family history includes Arthritis in his mother; Diabetes in his maternal grandmother; Scleroderma in his mother.   Allergies No Known Allergies   Home Medications  Prior to Admission medications   Medication Sig Start Date End Date Taking? Authorizing Provider  carvedilol (COREG) 25 MG tablet Take 1 tablet (25 mg total) by mouth 2 (two) times daily with a meal. 01/01/22   Monge, Petra Kuba, NP  empagliflozin (JARDIANCE) 10 MG TABS tablet Take 1 tablet (10 mg total) by mouth daily before breakfast. 02/06/22   [place], Peter M, MD  HYDROcodone-acetaminophen (NORCO/VICODIN) 5-325 MG tablet Take 1 tablet by mouth every 6 (six) hours as needed. 10/31/19   Petrucelli, Samantha R, PA-C  levETIRAcetam (KEPPRA) 500 MG tablet Take 3 tablets (1,500 mg total) by mouth 2 (two) times daily. 01/16/21     levETIRAcetam (KEPPRA) 500 MG tablet Take three tablets (1,500 mg dose) by mouth 2 (two) times daily. 06/26/21     sacubitril-valsartan (ENTRESTO) 97-103 MG Take 1 tablet by mouth 2 (two) times daily. 02/06/22   [place], Peter M, MD   spironolactone (ALDACTONE) 25 MG tablet Take 1 tablet (25 mg total) by mouth daily. 01/01/22   Joylene Grapes, NP     Critical care time: 42 minutes  Coralyn Helling, MD [facility] Pulmonary/Critical Care Pager - [phone] or [phone] 06/27/2022, 3:13 PM

## 2022-06-27 NOTE — Progress Notes (Signed)
Patient has appeared lethargic and somnolent throughout the shift. MD Katrinka Blazing and Rapid Nurse, Mitzi Davenport, both assessed patient at bedside. No change in VS throughout shift. Patient has had several episodes of vomiting with bile colored emesis; unrelieved by IV Zofran. Last episode of emesis around 12:00.  Pt also appearing diaphoretic, BG and Temp both checked but unremarkable. Pt difficult to arouse, requires sternal rub to wake him up enough to answer basic questions. Remains oriented x 4 during each assessment. Neuro exam unremarkable.Continues to have good urine output, no PO intake at this time.   PCCM consulted and orders for patient to be transported to ICU placed. Pt's wife at bedside, all questions answered at this time.  Full report called to RN on 2 Heart. Patient to be transported by this RN to new unit.

## 2022-06-27 NOTE — Progress Notes (Signed)
Heart Failure Navigator Progress Note  Following this hospitalization to assess for HV TOC readiness.   EF last 30-35%,  New Echo Per RN patient very lethargic, not responding to verbal much. Will be here over the weekend. Plan to interview early next week for HF TOC.   Rhae Hammock, BSN, Scientist, clinical (histocompatibility and immunogenetics) Only

## 2022-06-27 NOTE — Progress Notes (Signed)
ABG sent at 1711 is a VBG not ABG. Lab notified.

## 2022-06-27 NOTE — Progress Notes (Signed)
   Heart Failure Stewardship Pharmacist Progress Note   PCP: Patient, No Pcp Per PCP-Cardiologist: Peter [place], MD   HPI:  37 YO male with a PMH hypertension, CHF last EF 30-30%, seizure disorder, and morbid obesity.   Patient presented to the ED on 5/24 AM with complaints of dyspnea on exertion, worse within the last day. He admits he was out of all of his medications for a few months--restarted them about a week ago. Patient denies chest pain but has had some edema. CXR showing mild cardiomegaly with mild interstitial edema. Trops 153>>219, BNP 1071. Echo from 06/2021 showed EF 30-35%, global hypokinesis, mild MVR, RAP 3 mmHg. Repeat echo scheduled for today.   Current HF Medications: Diuretic: furosemide 80 mg IV x1  Prior to admission HF Medications  (med rec not complete, pt has been off of his medications for several months): Beta blocker: carvedilol 25 mg BID ACE/ARB/ARNI: Entresto 97-103 mg BID MRA: spironolactone 25 mg daily SGLT2i: Jardiance 10 mg daily  Pertinent Lab Values: Serum creatinine 0.96, BUN 14, Potassium 3.9, Sodium 139, BNP 1071, Magnesium 1.4, A1c pending  Vital Signs: Weight: 294 lbs (admission weight: 294 lbs) Blood pressure: 130s-150s/90s-100s  Heart rate: 70s-80s  I/O: not recorded for yesterday; net -1.9L  Medication Assistance / Insurance Benefits Check: Does the patient have prescription insurance?  Yes Type of insurance plan: Medicaid  Does the patient qualify for medication assistance through manufacturers or grants?   No  Outpatient Pharmacy:  Prior to admission outpatient pharmacy: [facility] Is the patient willing to use [facility] TOC pharmacy at discharge? Yes Is the patient willing to transition their outpatient pharmacy to utilize a [facility] outpatient pharmacy?   Pending   Assessment: 1. Acute on chronic systolic CHF (LVEF 30-35% from 06/2021 echo), due to NICM. NYHA class III symptoms.  - Scr stable at 0.96 (BL 0.9-1). Strict I's and O's.  Keep K >4 and Mg >2. Recommend supplementation of Mg. - Agree with starting IV furosemide 40 mg BID given continued volume overload  - Recommend restarting Entresto at 49/51 mg BID as patient has not been taking his higher dose of Entresto consistently PTA - Recommend restarting PTA Jardiance 10 mg daily  - Recommend holding PTA spironolactone and f/u Scr and BP after addition of above medications  - Recommend holding PTA carvedilol given acute decompensation and patient not taking consistently PTA   Plan: 1) Medication changes recommended at this time: - Start IV furosemide 40 mg BID - Recommend restarting Entresto at 49/51 mg BID as patient has not been taking his higher dose of Entresto consistently PTA - Recommend restarting PTA Jardiance 10 mg daily  - Give IV Mg 2g x1  2) Patient assistance: - None  3)  Education  - To be completed prior to discharge   Cherylin Mylar, PharmD PGY1 Pharmacy Resident 5/24/202410:45 AM

## 2022-06-27 NOTE — Progress Notes (Signed)
RT attempted to perform ABG, no success due to  pt not being compliant. RT  will try again later.

## 2022-06-28 ENCOUNTER — Inpatient Hospital Stay (HOSPITAL_COMMUNITY): Payer: Medicaid Other

## 2022-06-28 ENCOUNTER — Encounter (HOSPITAL_COMMUNITY)
Admission: EM | Disposition: A | Discharge status: Home / Self Care | Payer: Self-pay | Source: Home / Self Care | Attending: Critical Care Medicine

## 2022-06-28 DIAGNOSIS — I213 ST elevation (STEMI) myocardial infarction of unspecified site: Secondary | ICD-10-CM

## 2022-06-28 DIAGNOSIS — Z9911 Dependence on respirator [ventilator] status: Secondary | ICD-10-CM | POA: Diagnosis not present

## 2022-06-28 DIAGNOSIS — N179 Acute kidney failure, unspecified: Secondary | ICD-10-CM

## 2022-06-28 DIAGNOSIS — I2510 Atherosclerotic heart disease of native coronary artery without angina pectoris: Secondary | ICD-10-CM

## 2022-06-28 DIAGNOSIS — I5023 Acute on chronic systolic (congestive) heart failure: Secondary | ICD-10-CM | POA: Diagnosis not present

## 2022-06-28 DIAGNOSIS — R7989 Other specified abnormal findings of blood chemistry: Secondary | ICD-10-CM | POA: Clinically undetermined

## 2022-06-28 DIAGNOSIS — I420 Dilated cardiomyopathy: Secondary | ICD-10-CM

## 2022-06-28 HISTORY — PX: LEFT HEART CATH AND CORONARY ANGIOGRAPHY: CATH118249

## 2022-06-28 LAB — POCT I-STAT 7, (LYTES, BLD GAS, ICA,H+H)
Acid-Base Excess: 5 mmol/L — ABNORMAL HIGH (ref 0.0–2.0)
Bicarbonate: 28.9 mmol/L — ABNORMAL HIGH (ref 20.0–28.0)
Calcium, Ion: 1.18 mmol/L (ref 1.15–1.40)
HCT: 41 % (ref 39.0–52.0)
Hemoglobin: 13.9 g/dL (ref 13.0–17.0)
O2 Saturation: 99 %
Patient temperature: 36.6
Potassium: 3 mmol/L — ABNORMAL LOW (ref 3.5–5.1)
Sodium: 139 mmol/L (ref 135–145)
TCO2: 30 mmol/L (ref 22–32)
pCO2 arterial: 36.8 mmHg (ref 32–48)
pH, Arterial: 7.501 — ABNORMAL HIGH (ref 7.35–7.45)
pO2, Arterial: 123 mmHg — ABNORMAL HIGH (ref 83–108)

## 2022-06-28 LAB — CBC
HCT: 39.9 % (ref 39.0–52.0)
Hemoglobin: 12.9 g/dL — ABNORMAL LOW (ref 13.0–17.0)
MCH: 27.7 pg (ref 26.0–34.0)
MCHC: 32.3 g/dL (ref 30.0–36.0)
MCV: 85.8 fL (ref 80.0–100.0)
Platelets: 212 10*3/uL (ref 150–400)
RBC: 4.65 MIL/uL (ref 4.22–5.81)
RDW: 13.3 % (ref 11.5–15.5)
WBC: 8 10*3/uL (ref 4.0–10.5)
nRBC: 0 % (ref 0.0–0.2)

## 2022-06-28 LAB — BASIC METABOLIC PANEL
Anion gap: 12 (ref 5–15)
Anion gap: 11 (ref 5–15)
Anion gap: 11 (ref 5–15)
BUN: 17 mg/dL (ref 6–20)
BUN: 18 mg/dL (ref 6–20)
BUN: 20 mg/dL (ref 6–20)
CO2: 27 mmol/L (ref 22–32)
CO2: 28 mmol/L (ref 22–32)
CO2: 26 mmol/L (ref 22–32)
Calcium: 9 mg/dL (ref 8.9–10.3)
Calcium: 9.1 mg/dL (ref 8.9–10.3)
Calcium: 9 mg/dL (ref 8.9–10.3)
Chloride: 95 mmol/L — ABNORMAL LOW (ref 98–111)
Chloride: 97 mmol/L — ABNORMAL LOW (ref 98–111)
Chloride: 98 mmol/L (ref 98–111)
Creatinine, Ser: 1.44 mg/dL — ABNORMAL HIGH (ref 0.61–1.24)
Creatinine, Ser: 1.36 mg/dL — ABNORMAL HIGH (ref 0.61–1.24)
Creatinine, Ser: 1.46 mg/dL — ABNORMAL HIGH (ref 0.61–1.24)
GFR, Estimated: >60 mL/min (ref >60)
GFR, Estimated: >60 mL/min (ref >60)
GFR, Estimated: >60 mL/min (ref >60)
Glucose, Bld: 165 mg/dL — ABNORMAL HIGH (ref 70–99)
Glucose, Bld: 111 mg/dL — ABNORMAL HIGH (ref 70–99)
Glucose, Bld: 109 mg/dL — ABNORMAL HIGH (ref 70–99)
Potassium: 2.7 mmol/L — CL (ref 3.5–5.1)
Potassium: 4 mmol/L (ref 3.5–5.1)
Potassium: 3.4 mmol/L — ABNORMAL LOW (ref 3.5–5.1)
Sodium: 134 mmol/L — ABNORMAL LOW (ref 135–145)
Sodium: 136 mmol/L (ref 135–145)
Sodium: 135 mmol/L (ref 135–145)

## 2022-06-28 LAB — SEDIMENTATION RATE: Sed Rate: 15 mm/hr (ref 0–16)

## 2022-06-28 LAB — GLUCOSE, CAPILLARY
Glucose-Capillary: 99 mg/dL (ref 70–99)
Glucose-Capillary: 111 mg/dL — ABNORMAL HIGH (ref 70–99)
Glucose-Capillary: 130 mg/dL — ABNORMAL HIGH (ref 70–99)
Glucose-Capillary: 84 mg/dL (ref 70–99)
Glucose-Capillary: 103 mg/dL — ABNORMAL HIGH (ref 70–99)

## 2022-06-28 LAB — COOXEMETRY PANEL
Carboxyhemoglobin: 0.8 % (ref 0.5–1.5)
Methemoglobin: 0.8 % (ref 0.0–1.5)
O2 Saturation: 76.1 %
Total hemoglobin: 12.8 g/dL (ref 12.0–16.0)

## 2022-06-28 LAB — TROPONIN I (HIGH SENSITIVITY)
Troponin I (High Sensitivity): 1107 ng/L (ref <18)
Troponin I (High Sensitivity): 2350 ng/L (ref <18)

## 2022-06-28 LAB — CULTURE, BLOOD (SINGLE): Special Requests: ADEQUATE

## 2022-06-28 LAB — APTT
aPTT: 47 seconds — ABNORMAL HIGH (ref 24–36)
aPTT: 51 seconds — ABNORMAL HIGH (ref 24–36)

## 2022-06-28 LAB — HEPARIN LEVEL (UNFRACTIONATED)
Heparin Unfractionated: 0.34 IU/mL (ref 0.30–0.70)
Heparin Unfractionated: 0.39 IU/mL (ref 0.30–0.70)

## 2022-06-28 LAB — CULTURE, RESPIRATORY W GRAM STAIN: Gram Stain: NONE SEEN

## 2022-06-28 LAB — CREATININE, SERUM
Creatinine, Ser: 1.35 mg/dL — ABNORMAL HIGH (ref 0.61–1.24)
GFR, Estimated: >60 mL/min (ref >60)

## 2022-06-28 LAB — TRIGLYCERIDES: Triglycerides: 442 mg/dL — ABNORMAL HIGH (ref <150)

## 2022-06-28 LAB — MAGNESIUM: Magnesium: 2 mg/dL (ref 1.7–2.4)

## 2022-06-28 SURGERY — LEFT HEART CATH AND CORONARY ANGIOGRAPHY
Anesthesia: LOCAL

## 2022-06-28 MED ORDER — VITAL HIGH PROTEIN PO LIQD: 1000.0000 mL | ORAL | Status: DC

## 2022-06-28 MED ORDER — VERAPAMIL HCL 2.5 MG/ML IV SOLN
INTRAVENOUS | Status: DC | PRN
  Administered 2022-06-28: 10 mL via INTRA_ARTERIAL

## 2022-06-28 MED ORDER — CLOPIDOGREL BISULFATE 300 MG PO TABS
300.0000 mg | ORAL_TABLET | Freq: Once | ORAL | Status: AC
  Administered 2022-06-28: 300 mg
  Filled 2022-06-28: qty 1

## 2022-06-28 MED ORDER — SODIUM CHLORIDE 0.9 % IV SOLN: 250.0000 mL | INTRAVENOUS | Status: DC | PRN

## 2022-06-28 MED ORDER — TIROFIBAN HCL IN NACL 5-0.9 MG/100ML-% IV SOLN
0.1500 ug/kg/min | INTRAVENOUS | Status: AC
  Administered 2022-06-28: 0.15 ug/kg/min via INTRAVENOUS
  Filled 2022-06-28: qty 100

## 2022-06-28 MED ORDER — SODIUM CHLORIDE 0.9% FLUSH
3.0000 mL | Freq: Two times a day (BID) | INTRAVENOUS | Status: DC
  Administered 2022-06-29: 10 mL via INTRAVENOUS
  Administered 2022-06-29 – 2022-07-01 (×5): 3 mL via INTRAVENOUS

## 2022-06-28 MED ORDER — VERAPAMIL HCL 2.5 MG/ML IV SOLN
INTRAVENOUS | Status: AC
  Filled 2022-06-28: qty 2

## 2022-06-28 MED ORDER — FUROSEMIDE 10 MG/ML IJ SOLN: 80.0000 mg | Freq: Once | INTRAMUSCULAR | Status: DC

## 2022-06-28 MED ORDER — MIDAZOLAM HCL 2 MG/2ML IJ SOLN
INTRAMUSCULAR | Status: AC
  Filled 2022-06-28: qty 2

## 2022-06-28 MED ORDER — IOHEXOL 350 MG/ML SOLN
INTRAVENOUS | Status: DC | PRN
  Administered 2022-06-28: 70 mL

## 2022-06-28 MED ORDER — TIROFIBAN HCL IN NACL 5-0.9 MG/100ML-% IV SOLN
INTRAVENOUS | Status: AC
  Filled 2022-06-28: qty 100

## 2022-06-28 MED ORDER — POTASSIUM CHLORIDE 20 MEQ PO PACK
60.0000 meq | PACK | Freq: Once | ORAL | Status: AC
  Administered 2022-06-28: 60 meq
  Filled 2022-06-28: qty 3

## 2022-06-28 MED ORDER — VITAL 1.5 CAL PO LIQD
1000.0000 mL | ORAL | Status: DC
  Administered 2022-06-28: 1000 mL

## 2022-06-28 MED ORDER — LIDOCAINE HCL (PF) 1 % IJ SOLN
INTRAMUSCULAR | Status: AC
  Filled 2022-06-28: qty 30

## 2022-06-28 MED ORDER — SODIUM CHLORIDE 0.9 % IV SOLN: INTRAVENOUS | Status: AC

## 2022-06-28 MED ORDER — REVEFENACIN 175 MCG/3ML IN SOLN
175.0000 ug | Freq: Every day | RESPIRATORY_TRACT | Status: DC
  Administered 2022-06-29 – 2022-07-01 (×2): 175 ug via RESPIRATORY_TRACT
  Filled 2022-06-28 (×3): qty 3

## 2022-06-28 MED ORDER — PROSOURCE TF20 ENFIT COMPATIBL EN LIQD
60.0000 mL | Freq: Three times a day (TID) | ENTERAL | Status: DC
  Administered 2022-06-28 – 2022-06-29 (×3): 60 mL
  Filled 2022-06-28 (×3): qty 60

## 2022-06-28 MED ORDER — CLOPIDOGREL BISULFATE 75 MG PO TABS
75.0000 mg | ORAL_TABLET | Freq: Every day | ORAL | Status: DC
  Administered 2022-06-29 – 2022-07-01 (×3): 75 mg
  Filled 2022-06-28 (×3): qty 1

## 2022-06-28 MED ORDER — FUROSEMIDE 10 MG/ML IJ SOLN
80.0000 mg | Freq: Once | INTRAMUSCULAR | Status: AC
  Administered 2022-06-28: 80 mg via INTRAVENOUS
  Filled 2022-06-28: qty 8

## 2022-06-28 MED ORDER — MIDAZOLAM HCL 2 MG/2ML IJ SOLN
INTRAMUSCULAR | Status: DC | PRN
  Administered 2022-06-28: 1 mg via INTRAVENOUS

## 2022-06-28 MED ORDER — FENTANYL CITRATE (PF) 100 MCG/2ML IJ SOLN
25.0000 ug | Freq: Once | INTRAMUSCULAR | Status: AC
  Administered 2022-06-28: 25 ug via INTRAVENOUS

## 2022-06-28 MED ORDER — LIDOCAINE HCL URETHRAL/MUCOSAL 2 % EX GEL
1.0000 | Freq: Once | CUTANEOUS | Status: AC
  Administered 2022-06-28: 1 via URETHRAL
  Filled 2022-06-28: qty 6

## 2022-06-28 MED ORDER — IPRATROPIUM-ALBUTEROL 0.5-2.5 (3) MG/3ML IN SOLN: 3.0000 mL | RESPIRATORY_TRACT | Status: DC | PRN

## 2022-06-28 MED ORDER — TIROFIBAN HCL IN NACL 5-0.9 MG/100ML-% IV SOLN
INTRAVENOUS | Status: DC | PRN
  Administered 2022-06-28: .075 ug/kg/min via INTRAVENOUS

## 2022-06-28 MED ORDER — ARFORMOTEROL TARTRATE 15 MCG/2ML IN NEBU
15.0000 ug | INHALATION_SOLUTION | Freq: Two times a day (BID) | RESPIRATORY_TRACT | Status: DC
  Administered 2022-06-29 – 2022-07-01 (×4): 15 ug via RESPIRATORY_TRACT
  Filled 2022-06-28 (×5): qty 2

## 2022-06-28 MED ORDER — COLCHICINE 0.6 MG PO TABS
0.6000 mg | ORAL_TABLET | Freq: Two times a day (BID) | ORAL | Status: DC
  Administered 2022-06-28 (×2): 0.6 mg
  Filled 2022-06-28 (×3): qty 1

## 2022-06-28 MED ORDER — HEPARIN (PORCINE) 25000 UT/250ML-% IV SOLN
2400.0000 [IU]/h | INTRAVENOUS | Status: DC
  Administered 2022-06-29: 2200 [IU]/h via INTRAVENOUS
  Administered 2022-06-30: 2400 [IU]/h via INTRAVENOUS
  Filled 2022-06-28 (×3): qty 250

## 2022-06-28 MED ORDER — HEPARIN SODIUM (PORCINE) 5000 UNIT/ML IJ SOLN
5000.0000 [IU] | Freq: Three times a day (TID) | INTRAMUSCULAR | Status: DC
Start: 2022-06-28 — End: 2022-06-28

## 2022-06-28 MED ORDER — HEPARIN SODIUM (PORCINE) 1000 UNIT/ML IJ SOLN
INTRAMUSCULAR | Status: AC
  Filled 2022-06-28: qty 10

## 2022-06-28 MED ORDER — POTASSIUM CHLORIDE 20 MEQ PO PACK
80.0000 meq | PACK | Freq: Once | ORAL | Status: DC
  Filled 2022-06-28: qty 4

## 2022-06-28 MED ORDER — LIDOCAINE HCL (PF) 1 % IJ SOLN
INTRAMUSCULAR | Status: DC | PRN
  Administered 2022-06-28: 2 mL

## 2022-06-28 MED ORDER — POTASSIUM CHLORIDE 10 MEQ/50ML IV SOLN
10.0000 meq | INTRAVENOUS | Status: AC
  Administered 2022-06-28 (×6): 10 meq via INTRAVENOUS
  Filled 2022-06-28 (×6): qty 50

## 2022-06-28 MED ORDER — PROSOURCE TF20 ENFIT COMPATIBL EN LIQD: 60.0000 mL | Freq: Every day | ENTERAL | Status: DC

## 2022-06-28 MED ORDER — HEPARIN SODIUM (PORCINE) 1000 UNIT/ML IJ SOLN
INTRAMUSCULAR | Status: DC | PRN
  Administered 2022-06-28: 6000 [IU] via INTRAVENOUS

## 2022-06-28 MED ORDER — SODIUM CHLORIDE 0.9% FLUSH: 3.0000 mL | INTRAVENOUS | Status: DC | PRN

## 2022-06-28 MED ORDER — HEPARIN (PORCINE) IN NACL 1000-0.9 UT/500ML-% IV SOLN
INTRAVENOUS | Status: DC | PRN
  Administered 2022-06-28 (×2): 500 mL

## 2022-06-28 MED ORDER — TIROFIBAN (AGGRASTAT) BOLUS VIA INFUSION
INTRAVENOUS | Status: DC | PRN
  Administered 2022-06-28: 3265 ug via INTRAVENOUS

## 2022-06-28 SURGICAL SUPPLY — 11 items
CATH 5FR JL3.5 JR4 ANG PIG MP (CATHETERS) IMPLANT
CATH OPTITORQUE TIG 4.0 5F (CATHETERS) IMPLANT
DEVICE RAD COMP TR BAND LRG (VASCULAR PRODUCTS) IMPLANT
GLIDESHEATH SLEND SS 6F .021 (SHEATH) IMPLANT
GUIDEWIRE INQWIRE 1.5J.035X260 (WIRE) IMPLANT
INQWIRE 1.5J .035X260CM (WIRE) ×1
KIT HEART LEFT (KITS) ×1 IMPLANT
PACK CARDIAC CATHETERIZATION (CUSTOM PROCEDURE TRAY) ×1 IMPLANT
SHEATH PROBE COVER 6X72 (BAG) IMPLANT
TRANSDUCER W/STOPCOCK (MISCELLANEOUS) ×1 IMPLANT
TUBING CIL FLEX 10 FLL-RA (TUBING) ×1 IMPLANT

## 2022-06-28 NOTE — Progress Notes (Signed)
Initial Nutrition Assessment  DOCUMENTATION CODES:   Obesity unspecified  INTERVENTION:   Tube Feeding via OG: Vital 1.5 at 50 ml/hr Begin TF at rate of 20 ml/hr, titrate by 10 mL q 8 hours until goal rate of 50 ml/hr Pro-Source TF20 60 mL TID Goal TF regimen provides   Additional calories from propofol   NUTRITION DIAGNOSIS:   Inadequate oral intake related to acute illness as evidenced by NPO status.  Being addressed via TF  GOAL:   Patient will meet greater than or equal to 90% of their needs  Progressing  MONITOR:   Labs, Weight trends, TF tolerance, Vent status  REASON FOR ASSESSMENT:   Consult, Ventilator Enteral/tube feeding initiation and management  ASSESSMENT:   37 yo male admitted with acute on chronic respiratory failure with presume OSA, obesity hypoventilation syndrome and acute pulmonary edema. PH includes HTN, HFrEF, seizures  5/24 Admitted, Intubated  Pt remains on sedated on vent support Propofol: 8 ml/hr (211 kcals in 24 hours at current rate) Levophed: 5 mcg/min  Noted plan for [facility] once extubated  OG tube with tip in mid stomach per abd xray  Current wt 130.6 kg; possible wt loss per weight encounters  Unable to obtain diet and weight history from pt/family at this time  Labs: potassium 2.7 (L), sodium 134 (L), Creatinine 1.44, BUN wdl Meds: colace, miralax, lasix, KCl   NUTRITION - FOCUSED PHYSICAL EXAM:  Unable to assess  Diet Order:   Diet Order             Diet NPO time specified  Diet effective now                   EDUCATION NEEDS:   Not appropriate for education at this time  Skin:  Skin Assessment: Reviewed RN Assessment  Last BM:  5/23  Height:   Ht Readings from Last 1 Encounters:  06/27/22 5\' 11"  (1.803 m)    Weight:   Wt Readings from Last 1 Encounters:  06/28/22 130.6 kg    BMI:  Body mass index is 40.16 kg/m.  Estimated Nutritional Needs:   Kcal:  2000-2200 kcals  Protein:   135-165 g  Fluid:  1.8 L  Romelle Starcher MS, RDN, LDN, CNSC Registered Dietitian 3 Clinical Nutrition RD Pager and On-Call Pager Number Located in [facility]

## 2022-06-28 NOTE — Progress Notes (Signed)
On call cardiologist spoke with family about Mark Garcia having a heart catherization performed tonight. This RN was at bedside when family agreed for procedure. Family stepped off the unit before a written consent was obtained, however, consent form filled out  as "verbal consent obtained by family" by this RN and signed by provider and witnessed RN.

## 2022-06-28 NOTE — Progress Notes (Signed)
ANTICOAGULATION CONSULT NOTE  Pharmacy Consult for Heparin Indication: chest pain/ACS  No Known Allergies  Patient Measurements: Height: 5\' 11"  (180.3 cm) Weight: 130.6 kg (287 lb 14.7 oz) IBW/kg (Calculated) : 75.3 Heparin Dosing Weight: 98 kg  Vital Signs: Temp: 99 F (37.2 C) (05/25 1635) Temp Source: Axillary (05/25 1635) BP: 108/79 (05/25 2058) Pulse Rate: 74 (05/25 2058)  Labs: Recent Labs    06/27/22 0319 06/27/22 0440 06/27/22 1800 06/28/22 0028 06/28/22 0342 06/28/22 1205 06/28/22 1206 06/28/22 1418 06/28/22 1744  HGB 13.0  --   --  13.9  --   --   --   --   --   HCT 40.0  --   --  41.0  --   --   --   --   --   PLT 199  --   --   --   --   --   --   --   --   APTT  --   --  47*  --  47* 51*  --   --   --   HEPARINUNFRC  --   --   --   --  0.34  --  0.39  --   --   CREATININE 0.96  --   --   --  1.44*  --   --  1.36* 1.46*  TROPONINIHS 153*   < > 591*  --   --   --   --  1,107* 2,350*   < > = values in this interval not displayed.     Estimated Creatinine Clearance: 93.6 mL/min (A) (by C-G formula based on SCr of 1.46 mg/dL (H)).   Medical History: Past Medical History:  Diagnosis Date   Chronic HFrEF (heart failure with reduced ejection fraction) (HCC)    Hypertension    Morbid obesity (HCC)    Seizures (HCC)     Assessment: 37 YOM not on anticoagulation PTA who presented with SOB with troponins trending up. Pharmacy consulted to manage heparin for ACS.  S/p enoxaparin 0.5mg /kg = 60mg  sq x1 5/24 then heparin drip started later in day cbc stable no bleeding noted  Heparin drip 2000 uts/hr heparin level 0.39 at goal  Aptt starting to increase and correlate - will dose with heparin levels  Pt with increase troponin this PM. He was taken to cath. Plan to continue tirofiban until midnight. Restart heparin in AM x48 hr per Dr. Herbie Baltimore.  Goal of Therapy:  Heparin level 0.3-0.7 units/ml aPTT 66-102 seconds Monitor platelets by anticoagulation  protocol: Yes   Plan:  Continue tirofiban until midnight Restart heparin at 2000 units/hr at 0700 Check 6 hr HL Monitor daily CBC and heparin levels Monitor  s/sx of bleeding  Ulyses Southward, PharmD, BCIDP, AAHIVP, CPP Infectious Disease Pharmacist 06/28/2022 9:21 PM

## 2022-06-28 NOTE — Progress Notes (Signed)
ANTICOAGULATION CONSULT NOTE  Pharmacy Consult for Heparin Indication: chest pain/ACS  No Known Allergies  Patient Measurements: Height: 5\' 11"  (180.3 cm) Weight: 130.6 kg (287 lb 14.7 oz) IBW/kg (Calculated) : 75.3 Heparin Dosing Weight: 98 kg  Vital Signs: Temp: 99 F (37.2 C) (05/25 1205) Temp Source: Oral (05/25 1205) BP: 105/68 (05/25 0805) Pulse Rate: 69 (05/25 0815)  Labs: Recent Labs    06/27/22 0319 06/27/22 0440 06/27/22 0919 06/27/22 1112 06/27/22 1800 06/28/22 0028 06/28/22 0342 06/28/22 1205 06/28/22 1206  HGB 13.0  --   --   --   --  13.9  --   --   --   HCT 40.0  --   --   --   --  41.0  --   --   --   PLT 199  --   --   --   --   --   --   --   --   APTT  --   --   --   --  47*  --  47* 51*  --   HEPARINUNFRC  --   --   --   --   --   --  0.34  --  0.39  CREATININE 0.96  --   --   --   --   --  1.44*  --   --   TROPONINIHS 153*   < > 302* 330* 591*  --   --   --   --    < > = values in this interval not displayed.     Estimated Creatinine Clearance: 94.9 mL/min (A) (by C-G formula based on SCr of 1.44 mg/dL (H)).   Medical History: Past Medical History:  Diagnosis Date   Chronic HFrEF (heart failure with reduced ejection fraction) (HCC)    Hypertension    Morbid obesity (HCC)    Seizures (HCC)     Assessment: 37 YOM not on anticoagulation PTA who presented with SOB with troponins trending up. Pharmacy consulted to manage heparin for ACS.  S/p enoxaparin 0.5mg /kg = 60mg  sq x1 5/24 then heparin drip started later in day cbc stable no bleeding noted  Heparin drip 2000 uts/hr heparin level 0.39 at goal  Aptt starting to increase and correlate - will dose with heparin levels   Goal of Therapy:  Heparin level 0.3-0.7 units/ml aPTT 66-102 seconds Monitor platelets by anticoagulation protocol: Yes   Plan:  Continue heparin infusion rate 2000 units/hr Monitor daily CBC and heparin levels Monitor  s/sx of bleeding   Leota Sauers Pharm.D.  CPP, BCPS Clinical Pharmacist [phone] 06/28/2022 2:19 PM   Please check AMION for all [facility] Pharmacy numbers

## 2022-06-28 NOTE — H&P (View-Only) (Signed)
Advanced Heart Failure Team Consult Note   Primary Physician: Patient, No Pcp Per PCP-Cardiologist:  Peter [place]az[place], MD  Reason for Consultation: A/c systolic HF  HPI:    Mark Garcia is seen today for evaluation of a/c systolic HF at the request of Dr. Chestine Spore.   Mark Garcia is a 37 y.o. male with a hx of chronic HFrEF, HTN, morbid obesity, suspected OSA, seizures, DM2.   Mark Garcia was initially admitted in 2021 with new onset systolic HF in the setting of HTN SBP >220. He had not been taking his BP medications previously because of lack of of insurance. 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 He has not had prior ischemic assessment. He has had intermittent issues with medication adherence. For this reason he has not been felt to be a good ICD candidate. Last echo 06/2021 showed EF 30-35%, global HK, mild MR. He was last seen in our office 12/2021 with elevated BP in setting of not taking his medications. He was provided refills. He did not attend the advised 1-2 month f/u. Sleep study previously recommended but he did not have this done.    Admitted several days ago with progressive HF symptoms and encephalopathy. He had not been taking his medicines for several months. Presenting BP 136/111. hsTroponin 153->219->302, BNP 1071, Cr 0.96, K 3.9. CXR mild cardiomegaly with findings suggestive of mild interstitial edema. Developed respiratory failure and intubated.   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 No acute process CXR with possible LLL infiltrate   Repeat echo showed EF 20-25%, moderately dilated LV, G2DD, mild LAE, dilated IVC.   Now on NE 5 (mostly to support sedation). Co-ox 76% CVP 7  Today RN notes ST elevation on monitor. ECG obtained with diffuse ST elevation and PR depression. Patient denied CP   I did bedside echo with Dr. Chestine Spore at bedside. EF 20-25% with global HK no RWMA (unchanged) Repeat hstrop pending     Review of Systems: limited due to patient on ventilator )shakes his head no when asked about CP)  Home Medications Prior to Admission medications   Medication Sig Start Date End Date Taking? Authorizing Provider  carvedilol (COREG) 25 MG tablet Take 1 tablet (25 mg total) by mouth 2 (two) times daily with a meal. 01/01/22  Yes Monge, Petra Kuba, NP  spironolactone (ALDACTONE) 25 MG tablet Take 1 tablet (25 mg total) by mouth daily. 01/01/22  Yes Monge, Petra Kuba, NP  empagliflozin (JARDIANCE) 10 MG TABS tablet Take 1 tablet (10 mg total) by mouth daily before breakfast. 02/06/22   [place], Peter M, MD  levETIRAcetam (KEPPRA) 500 MG tablet Take 3 tablets (1,500 mg total) by mouth 2 (two) times daily. 01/16/21     levETIRAcetam (KEPPRA) 500 MG tablet Take three tablets (1,500 mg dose) by mouth 2 (two) times daily. 06/26/21     sacubitril-valsartan (ENTRESTO) 97-103 MG Take 1 tablet by mouth 2 (two) times daily. 02/06/22   [place], Peter M, MD   Inpatient Meds:   arformoterol  15 mcg Nebulization BID   Chlorhexidine Gluconate Cloth  6 each Topical Daily   colchicine  0.6 mg Per Tube BID   docusate  100 mg Per Tube BID   feeding supplement (PROSource TF20)  60 mL Per Tube TID   furosemide  80 mg Intravenous Once   pantoprazole (PROTONIX) IV  40 mg Intravenous Daily   polyethylene glycol  17 g Per Tube  Daily   revefenacin  175 mcg Nebulization Daily   sodium chloride flush  10-40 mL Intracatheter Q12H   sodium chloride flush  3 mL Intravenous Q12H     sodium chloride 250 mL (06/28/22 1055)   cefTRIAXone (ROCEPHIN)  IV Stopped (06/27/22 2151)   feeding supplement (VITAL 1.5 CAL)     fentaNYL infusion INTRAVENOUS 50 mcg/hr (06/28/22 1430)   heparin 2,000 Units/hr (06/28/22 0800)   levETIRAcetam Stopped (06/28/22 0244)   metronidazole 500 mg (06/28/22 1058)   norepinephrine (LEVOPHED) Adult infusion 5 mcg/min (06/28/22 0800)   potassium chloride 10 mEq (06/28/22 1323)   propofol (DIPRIVAN)  infusion 9 mcg/kg/min (06/28/22 0800)     Past Medical History: Past Medical History:  Diagnosis Date   Chronic HFrEF (heart failure with reduced ejection fraction) (HCC)    Hypertension    Morbid obesity (HCC)    Seizures (HCC)     Past Surgical History: Past Surgical History:  Procedure Laterality Date   FINGER AMPUTATION Left    tip of L index finger>>got caught in a machine    Family History: Family History  Problem Relation Age of Onset   Scleroderma Mother    Arthritis Mother    Diabetes Maternal Grandmother     Social History: Social History   Socioeconomic History   Marital status: Single    Spouse name: Not on file   Number of children: Not on file   Years of education: Not on file   Highest education level: Not on file  Occupational History   Occupation: Engineer, materials, 2nd shift    Employer: [employer]  Tobacco Use   Smoking status: Former    Types: Cigarettes    Quit date: 02/04/2012    Years since quitting: 10.4   Smokeless tobacco: Never  Substance and Sexual Activity   Alcohol use: No   Drug use: No   Sexual activity: Not on file  Other Topics Concern   Not on file  Social History Narrative   Pt lives with girlfriend.   Social Determinants of Health   Financial Resource Strain: High Risk (10/24/2020)   Overall Financial Resource Strain (CARDIA)    Difficulty of Paying Living Expenses: Hard  Food Insecurity: Patient Unable To Answer (06/27/2022)   Hunger Vital Sign    Worried About Running Out of Food in the Last Year: Patient unable to answer    Ran Out of Food in the Last Year: Patient unable to answer  Transportation Needs: Patient Unable To Answer (06/27/2022)   PRAPARE - Administrator, Civil Service (Medical): Patient unable to answer    Lack of Transportation (Non-Medical): Patient unable to answer  Physical Activity: Not on file  Stress: Not on file  Social Connections: Not on file    Allergies:  No Known  Allergies  Objective:    Vital Signs:   Temp:  [97.9 F (36.6 C)-99 F (37.2 C)] 99 F (37.2 C) (05/25 1205) Pulse Rate:  [59-96] 69 (05/25 0815) Resp:  [0-33] 31 (05/25 1012) BP: (65-171)/(35-150) 105/68 (05/25 0805) SpO2:  [81 %-100 %] 93 % (05/25 0815) FiO2 (%):  [40 %-100 %] 40 % (05/25 1416) Weight:  [130.6 kg] 130.6 kg (05/25 0500) Last BM Date : 06/26/22  Weight change: Filed Weights   06/27/22 0715 06/28/22 0500  Weight: 133.4 kg 130.6 kg    Intake/Output:   Intake/Output Summary (Last 24 hours) at 06/28/2022 1438 Last data filed at 06/28/2022 1300 Gross per 24 hour  Intake 1345.94 ml  Output 2700 ml  Net -1354.06 ml      Physical Exam    General:  Awake on vent follows commands  HEENT: normal + ETT Neck: supple. JVP 7 . Carotids 2+ bilat; no bruits. No lymphadenopathy or thyromegaly appreciated. Cor: PMI nondisplaced. Regular rate & rhythm. No rubs, gallops or murmurs. Lungs: clear Abdomen: obese soft, nontender, nondistended. No hepatosplenomegaly. No bruits or masses. Good bowel sounds. Extremities: no cyanosis, clubbing, rash, edema Neuro: follows commands. Non-focal   Telemetry   Sinus 70-80s Personally reviewed   Labs   Basic Metabolic Panel: Recent Labs  Lab 06/27/22 0319 06/27/22 0933 06/27/22 2205 06/28/22 0028 06/28/22 0342  NA 139  --   --  139 134*  K 3.9  --   --  3.0* 2.7*  CL 103  --   --   --  95*  CO2 29  --   --   --  27  GLUCOSE 101*  --   --   --  165*  BUN 14  --   --   --  17  CREATININE 0.96  --   --   --  1.44*  CALCIUM 9.5  --   --   --  9.0  MG  --  1.4* 2.4  --  2.0    Liver Function Tests: Recent Labs  Lab 06/27/22 2205  AST 44*  ALT 42  ALKPHOS 44  BILITOT 0.7  PROT 7.3  ALBUMIN 3.5   No results for input(s): "LIPASE", "AMYLASE" in the last 168 hours. Recent Labs  Lab 06/27/22 1418  AMMONIA 33    CBC: Recent Labs  Lab 06/27/22 0319 06/28/22 0028  WBC 6.9  --   NEUTROABS 4.1  --   HGB  13.0 13.9  HCT 40.0 41.0  MCV 86.0  --   PLT 199  --     Cardiac Enzymes: No results for input(s): "CKTOTAL", "CKMB", "CKMBINDEX", "TROPONINI" in the last 168 hours.  BNP: BNP (last 3 results) Recent Labs    06/27/22 0319  BNP 1,071.2*    ProBNP (last 3 results) No results for input(s): "PROBNP" in the last 8760 hours.   CBG: Recent Labs  Lab 06/27/22 0844 06/27/22 1924 06/28/22 0300 06/28/22 0816 06/28/22 1205  GLUCAP 129* 125* 99 111* 130*    Coagulation Studies: No results for input(s): "LABPROT", "INR" in the last 72 hours.   Imaging   DG Abd Portable 1V  Result Date: 06/28/2022 CLINICAL DATA:  Evaluate OG tube placement. EXAM: PORTABLE ABDOMEN - 1 VIEW COMPARISON:  06/28/2022, earlier today. FINDINGS: The enteric tube is again noted with tip the in the proximal stomach approximately 7.9 cm below the GE junction. Bowel gas pattern appears nonspecific with no dilated loops of small bowel. IMPRESSION: Enteric tube tip is in the proximal stomach approximately 7.9 cm below the GE junction. Electronically Signed   By: Signa Kell M.D.   On: 06/28/2022 11:50   DG Abd 1 View  Result Date: 06/28/2022 CLINICAL DATA:  OG tube placement. EXAM: ABDOMEN - 1 VIEW COMPARISON:  06/27/2022 FINDINGS: Portal view upper abdomen demonstrates NG tube tip in the mid stomach with proximal side port position below the GE junction. Visualized upper abdomen demonstrates nonspecific gas pattern. IMPRESSION: NG tube tip is in the mid stomach. Electronically Signed   By: Kennith Center M.D.   On: 06/28/2022 09:26   DG Chest Port 1 View  Result Date: 06/28/2022 CLINICAL DATA:  Respiratory failure. EXAM: PORTABLE CHEST 1 VIEW COMPARISON:  06/27/2022 FINDINGS: Endotracheal tube tip is 3.5 cm above the base of the carina. NG tube courses obscured by the overlying cardiac monitoring device. Right PICC line tip overlies the region of the SVC/RA junction. The cardio pericardial silhouette is  enlarged. Interval improvement in aeration at the left base with some persistent left base atelectasis or infiltrate. No overt pulmonary edema. No substantial pleural effusion. Telemetry leads overlie the chest. IMPRESSION: Interval improvement in aeration at the left base with some persistent left base atelectasis or infiltrate. Electronically Signed   By: Kennith Center M.D.   On: 06/28/2022 09:24   CT HEAD WO CONTRAST ( )  Result Date: 06/28/2022 CLINICAL DATA:  Altered mental status EXAM: CT HEAD WITHOUT CONTRAST TECHNIQUE: Contiguous axial images were obtained from the base of the skull through the vertex without intravenous contrast. RADIATION DOSE REDUCTION: This exam was performed according to the departmental dose-optimization program which includes automated exposure control, adjustment of the mA and/or kV according to patient size and/or use of iterative reconstruction technique. COMPARISON:  03/13/2012 FINDINGS: Brain: There is no mass, hemorrhage or extra-axial collection. The size and configuration of the ventricles and extra-axial CSF spaces are normal. There is hypoattenuation of the white matter, most commonly indicating chronic small vessel disease. Old left cerebellar infarct. Vascular: No abnormal hyperdensity of the major intracranial arteries or dural venous sinuses. No intracranial atherosclerosis. Skull: The visualized skull base, calvarium and extracranial soft tissues are normal. Sinuses/Orbits: Bilateral ostiomeatal complex pattern sinus opacification. The orbits are normal. IMPRESSION: 1. No acute intracranial abnormality. 2. 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 3. Bilateral ostiomeatal complex pattern sinus opacification. Electronically Signed   By: Deatra Robinson M.D.   On: 06/28/2022 01:13   DG CHEST PORT 1 VIEW  Result Date: 06/27/2022 CLINICAL DATA:  Hypoxia EXAM: PORTABLE CHEST 1 VIEW COMPARISON:  Chest x-ray 06/27/2022, 02/17/2019  FINDINGS: Endotracheal tube tip is about 5 cm superior to carina. Cardiomegaly with consolidation at the left lung base and possible effusion. Right upper extremity central venous catheter tip at the SVC. No pneumothorax. IMPRESSION: 1. Endotracheal tube tip about 5 cm superior to carina. 2. Cardiomegaly with consolidation at the left lung base and possible effusion. Electronically Signed   By: Jasmine Pang M.D.   On: 06/27/2022 19:58   DG Abd Portable 1V  Result Date: 06/27/2022 CLINICAL DATA:  Evaluate NG tube placement EXAM: PORTABLE ABDOMEN - 1 VIEW COMPARISON:  None Available. FINDINGS: Bowel gas pattern in the upper abdomen is unremarkable. There is no demonstrable NG tube in the course of thoracic esophagus and stomach. Possibility of NG tube being coiled within pharynx should be considered. IMPRESSION: NG tube is not seen.Possibility of NG tube being coiled within pharynx should be considered. Electronically Signed   By: Ernie Avena M.D.   On: 06/27/2022 18:53   DG Chest Port 1 View  Result Date: 06/27/2022 CLINICAL DATA:  Difficulty breathing EXAM: PORTABLE CHEST 1 VIEW COMPARISON:  Previous studies including the examination done earlier today FINDINGS: Transverse diameter of heart is increased. There is increased opacity in left lung. No focal infiltrates are seen in right lung. There is blunting of left lateral CP angle. Right lateral CP angle is clear. There is no evidence of pneumothorax. Tip of endotracheal tube is 4 cm above the carina. NG tube is not seen. Tip of PICC line is seen in the region of superior vena cava. IMPRESSION: Cardiomegaly.  There is decreased aeration in left lung suggesting atelectasis/pneumonia. Possibility of obstruction of central left bronchus by mucous plugs is not excluded. No focal infiltrates are seen in right lung. Tip of endotracheal tube is 4 cm above the carina. NG tube is not seen. Electronically Signed   By: Ernie Avena M.D.   On: 06/27/2022  18:51   DG Abd 1 View  Result Date: 06/27/2022 CLINICAL DATA:  Abdominal pain. EXAM: ABDOMEN - 1 VIEW COMPARISON:  None Available. FINDINGS: Divided views of the abdomen obtained. No bowel dilatation to suggest obstruction. Minimal formed stool in the colon. Calcification in the right pelvis typical of phleboliths. More detailed assessment is limited due to portable technique and body habitus. IMPRESSION: Normal bowel gas pattern. Electronically Signed   By: Narda Rutherford M.D.   On: 06/27/2022 16:44   DG Chest Port 1 View  Result Date: 06/27/2022 CLINICAL DATA:  Shortness of breath. EXAM: PORTABLE CHEST 1 VIEW COMPARISON:  Radiograph earlier today. FINDINGS: There is a new right upper extremity PICC with tip overlying the lower SVC. No pneumothorax. Cardiomegaly is stable. Worsening peribronchial and interstitial thickening likely pulmonary edema. There may be small pleural effusions. No focal airspace disease. IMPRESSION: 1. Tip of the new right upper extremity PICC projects over the lower SVC. 2. Worsening pulmonary edema. Stable cardiomegaly. Possible small pleural effusions. Electronically Signed   By: Narda Rutherford M.D.   On: 06/27/2022 16:42     Medications:     Current Medications:  arformoterol  15 mcg Nebulization BID   Chlorhexidine Gluconate Cloth  6 each Topical Daily   colchicine  0.6 mg Per Tube BID   docusate  100 mg Per Tube BID   feeding supplement (PROSource TF20)  60 mL Per Tube TID   furosemide  80 mg Intravenous Once   pantoprazole (PROTONIX) IV  40 mg Intravenous Daily   polyethylene glycol  17 g Per Tube Daily   revefenacin  175 mcg Nebulization Daily   sodium chloride flush  10-40 mL Intracatheter Q12H   sodium chloride flush  3 mL Intravenous Q12H    Infusions:  sodium chloride 250 mL (06/28/22 1055)   cefTRIAXone (ROCEPHIN)  IV Stopped (06/27/22 2151)   feeding supplement (VITAL 1.5 CAL)     fentaNYL infusion INTRAVENOUS 50 mcg/hr (06/28/22 1430)    heparin 2,000 Units/hr (06/28/22 0800)   levETIRAcetam Stopped (06/28/22 0244)   metronidazole 500 mg (06/28/22 1058)   norepinephrine (LEVOPHED) Adult infusion 5 mcg/min (06/28/22 0800)   potassium chloride 10 mEq (06/28/22 1323)   propofol (DIPRIVAN) infusion 9 mcg/kg/min (06/28/22 0800)     Assessment/Plan   1. Acute on chronic systolic HF - onset  2021  - Last echo 06/2021 showed EF 30-35% thought to be HTN - Echo this admit EF 20-25% - ECG suggestive of myopericarditis. - No CP - hsTrop 153 -> 219 -> 302 -> 330 -> 591 - On NE 5 Co-ox 76% - CVP 7 - hold lasix - start GDMT as BP tolerates - will plan cMRI when extubated. Can consider R/L cath as needed  2. ST elevation on ECG -> likely myopericarditis - denies CP - hstrop moderately elevated but flat.  - suspect myopericarditis. Will need cMRI - start colchicine - cMRI as above  3. A/c hypoxic hypoxic respiratory failure - likely due to HF/PNA  4. Encephalopathy - resolved - CCM managing  5. AKI - likely volume depletion - hold diuretics  6. Hypokalemia - supp   D/w  Drs. Eden Emms and Chestine Spore personally   CRITICAL CARE Performed by: Arvilla Meres  Total critical care time: 55 minutes  Critical care time was exclusive of separately billable procedures and treating other patients.  Critical care was necessary to treat or prevent imminent or life-threatening deterioration.  Critical care was time spent personally by me (independent of midlevel providers or residents) on the following activities: development of treatment plan with patient and/or surrogate as well as nursing, discussions with consultants, evaluation of patient's response to treatment, examination of patient, obtaining history from patient or surrogate, ordering and performing treatments and interventions, ordering and review of laboratory studies, ordering and review of radiographic studies, pulse oximetry and re-evaluation of patient's  condition.     Length of Stay: 1  Arvilla Meres, MD  06/28/2022, 2:38 PM  Advanced Heart Failure Team Pager [phone] (M-F; 7a - 5p)  Please contact [facility] for night-coverage after hours (4p -7a ) and weekends on [email or web address]

## 2022-06-28 NOTE — Progress Notes (Signed)
Repeat trop 2300 He denies CP, but is on fentanyl gtt.  BP 96/62   Pulse 74   Temp 99 F (37.2 C) (Axillary)   Resp 19   Ht 5\' 11"  (1.803 m)   Wt 130.6 kg   SpO2 93%   BMI 40.16 kg/m  He wakes up, waves, motions that he wants to drink a glass of water.  RRR, no pericardial rub Still no wheezing. CTA.   Discussed with Cardiology - planning for [facility] tonight. I called to update his s/o  [place].    Steffanie Dunn, DO 06/28/22 7:20 PM  Pulmonary & Critical Care  For contact information, see Amion. If no response to pager, please call PCCM consult pager. After hours, 7PM- 7AM, please call Elink.

## 2022-06-28 NOTE — Consult Note (Addendum)
  Advanced Heart Failure Team Consult Note   Primary Physician: Patient, No Pcp Per PCP-Cardiologist:  Mark Jordan, MD  Reason for Consultation: A/c systolic HF  HPI:    Mark Garcia is seen today for evaluation of a/c systolic HF at the request of Dr. Clark.   Mark Garcia is a 37 y.o. male with a hx of chronic HFrEF, HTN, morbid obesity, suspected OSA, seizures, DM2.   Mark Garcia was initially admitted in 2021 with new onset systolic HF in the setting of HTN SBP >220. He had not been taking his BP medications previously because of lack of of insurance. 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 He has not had prior ischemic assessment. He has had intermittent issues with medication adherence. For this reason he has not been felt to be a good ICD candidate. Last echo 06/2021 showed EF 30-35%, global HK, mild MR. He was last seen in our office 12/2021 with elevated BP in setting of not taking his medications. He was provided refills. He did not attend the advised 1-2 month f/u. Sleep study previously recommended but he did not have this done.    Admitted several days ago with progressive HF symptoms and encephalopathy. He had not been taking his medicines for several months. Presenting BP 136/111. hsTroponin 153->219->302, BNP 1071, Cr 0.96, K 3.9. CXR mild cardiomegaly with findings suggestive of mild interstitial edema. Developed respiratory failure and intubated.   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 No acute process CXR with possible LLL infiltrate   Repeat echo showed EF 20-25%, moderately dilated LV, G2DD, mild LAE, dilated IVC.   Now on NE 5 (mostly to support sedation). Co-ox 76% CVP 7  Today RN notes ST elevation on monitor. ECG obtained with diffuse ST elevation and PR depression. Patient denied CP   I did bedside echo with Dr. Clark at bedside. EF 20-25% with global HK no RWMA (unchanged) Repeat hstrop pending     Review of Systems: limited due to patient on ventilator )shakes his head no when asked about CP)  Home Medications Prior to Admission medications   Medication Sig Start Date End Date Taking? Authorizing Provider  carvedilol (COREG) 25 MG tablet Take 1 tablet (25 mg total) by mouth 2 (two) times daily with a meal. 01/01/22  Yes Mark Garcia, Mark Garcia  spironolactone (ALDACTONE) 25 MG tablet Take 1 tablet (25 mg total) by mouth daily. 01/01/22  Yes Mark Garcia, Mark Garcia  empagliflozin (JARDIANCE) 10 MG TABS tablet Take 1 tablet (10 mg total) by mouth daily before breakfast. 02/06/22   Garcia, Mark M, MD  levETIRAcetam (KEPPRA) 500 MG tablet Take 3 tablets (1,500 mg total) by mouth 2 (two) times daily. 01/16/21     levETIRAcetam (KEPPRA) 500 MG tablet Take three tablets (1,500 mg dose) by mouth 2 (two) times daily. 06/26/21     sacubitril-valsartan (ENTRESTO) 97-103 MG Take 1 tablet by mouth 2 (two) times daily. 02/06/22   Garcia, Mark M, MD   Inpatient Meds:   arformoterol  15 mcg Nebulization BID   Chlorhexidine Gluconate Cloth  6 each Topical Daily   colchicine  0.6 mg Per Tube BID   docusate  100 mg Per Tube BID   feeding supplement (PROSource TF20)  60 mL Per Tube TID   furosemide  80 mg Intravenous Once   pantoprazole (PROTONIX) IV  40 mg Intravenous Daily   polyethylene glycol  17 g Per Tube   Daily   revefenacin  175 mcg Nebulization Daily   sodium chloride flush  10-40 mL Intracatheter Q12H   sodium chloride flush  3 mL Intravenous Q12H     sodium chloride 250 mL (06/28/22 1055)   cefTRIAXone (ROCEPHIN)  IV Stopped (06/27/22 2151)   feeding supplement (VITAL 1.5 CAL)     fentaNYL infusion INTRAVENOUS 50 mcg/hr (06/28/22 1430)   heparin 2,000 Units/hr (06/28/22 0800)   levETIRAcetam Stopped (06/28/22 0244)   metronidazole 500 mg (06/28/22 1058)   norepinephrine (LEVOPHED) Adult infusion 5 mcg/min (06/28/22 0800)   potassium chloride 10 mEq (06/28/22 1323)   propofol (DIPRIVAN)  infusion 9 mcg/kg/min (06/28/22 0800)     Past Medical History: Past Medical History:  Diagnosis Date   Chronic HFrEF (heart failure with reduced ejection fraction) (HCC)    Hypertension    Morbid obesity (HCC)    Seizures (HCC)     Past Surgical History: Past Surgical History:  Procedure Laterality Date   FINGER AMPUTATION Left    tip of L index finger>>got caught in a machine    Family History: Family History  Problem Relation Age of Onset   Scleroderma Mother    Arthritis Mother    Diabetes Maternal Grandmother     Social History: Social History   Socioeconomic History   Marital status: Single    Spouse name: Not on file   Number of children: Not on file   Years of education: Not on file   Highest education level: Not on file  Occupational History   Occupation: Security officer, 2nd shift    Employer: [employer]  Tobacco Use   Smoking status: Former    Types: Cigarettes    Quit date: 02/04/2012    Years since quitting: 10.4   Smokeless tobacco: Never  Substance and Sexual Activity   Alcohol use: No   Drug use: No   Sexual activity: Not on file  Other Topics Concern   Not on file  Social History Narrative   Pt lives with girlfriend.   Social Determinants of Health   Financial Resource Strain: High Risk (10/24/2020)   Overall Financial Resource Strain (CARDIA)    Difficulty of Paying Living Expenses: Hard  Food Insecurity: Patient Unable To Answer (06/27/2022)   Hunger Vital Sign    Worried About Running Out of Food in the Last Year: Patient unable to answer    Ran Out of Food in the Last Year: Patient unable to answer  Transportation Needs: Patient Unable To Answer (06/27/2022)   PRAPARE - Transportation    Lack of Transportation (Medical): Patient unable to answer    Lack of Transportation (Non-Medical): Patient unable to answer  Physical Activity: Not on file  Stress: Not on file  Social Connections: Not on file    Allergies:  No Known  Allergies  Objective:    Vital Signs:   Temp:  [97.9 F (36.6 C)-99 F (37.2 C)] 99 F (37.2 C) (05/25 1205) Pulse Rate:  [59-96] 69 (05/25 0815) Resp:  [0-33] 31 (05/25 1012) BP: (65-171)/(35-150) 105/68 (05/25 0805) SpO2:  [81 %-100 %] 93 % (05/25 0815) FiO2 (%):  [40 %-100 %] 40 % (05/25 1416) Weight:  [130.6 kg] 130.6 kg (05/25 0500) Last BM Date : 06/26/22  Weight change: Filed Weights   06/27/22 0715 06/28/22 0500  Weight: 133.4 kg 130.6 kg    Intake/Output:   Intake/Output Summary (Last 24 hours) at 06/28/2022 1438 Last data filed at 06/28/2022 1300 Gross per 24 hour    Intake 1345.94 ml  Output 2700 ml  Net -1354.06 ml      Physical Exam    General:  Awake on vent follows commands  HEENT: normal + ETT Neck: supple. JVP 7 . Carotids 2+ bilat; no bruits. No lymphadenopathy or thyromegaly appreciated. Cor: PMI nondisplaced. Regular rate & rhythm. No rubs, gallops or murmurs. Lungs: clear Abdomen: obese soft, nontender, nondistended. No hepatosplenomegaly. No bruits or masses. Good bowel sounds. Extremities: no cyanosis, clubbing, rash, edema Neuro: follows commands. Non-focal   Telemetry   Sinus 70-80s Personally reviewed   Labs   Basic Metabolic Panel: Recent Labs  Lab 06/27/22 0319 06/27/22 0933 06/27/22 2205 06/28/22 0028 06/28/22 0342  NA 139  --   --  139 134*  K 3.9  --   --  3.0* 2.7*  CL 103  --   --   --  95*  CO2 29  --   --   --  27  GLUCOSE 101*  --   --   --  165*  BUN 14  --   --   --  17  CREATININE 0.96  --   --   --  1.44*  CALCIUM 9.5  --   --   --  9.0  MG  --  1.4* 2.4  --  2.0    Liver Function Tests: Recent Labs  Lab 06/27/22 2205  AST 44*  ALT 42  ALKPHOS 44  BILITOT 0.7  PROT 7.3  ALBUMIN 3.5   No results for input(s): "LIPASE", "AMYLASE" in the last 168 hours. Recent Labs  Lab 06/27/22 1418  AMMONIA 33    CBC: Recent Labs  Lab 06/27/22 0319 06/28/22 0028  WBC 6.9  --   NEUTROABS 4.1  --   HGB  13.0 13.9  HCT 40.0 41.0  MCV 86.0  --   PLT 199  --     Cardiac Enzymes: No results for input(s): "CKTOTAL", "CKMB", "CKMBINDEX", "TROPONINI" in the last 168 hours.  BNP: BNP (last 3 results) Recent Labs    06/27/22 0319  BNP 1,071.2*    ProBNP (last 3 results) No results for input(s): "PROBNP" in the last 8760 hours.   CBG: Recent Labs  Lab 06/27/22 0844 06/27/22 1924 06/28/22 0300 06/28/22 0816 06/28/22 1205  GLUCAP 129* 125* 99 111* 130*    Coagulation Studies: No results for input(s): "LABPROT", "INR" in the last 72 hours.   Imaging   DG Abd Portable 1V  Result Date: 06/28/2022 CLINICAL DATA:  Evaluate OG tube placement. EXAM: PORTABLE ABDOMEN - 1 VIEW COMPARISON:  06/28/2022, earlier today. FINDINGS: The enteric tube is again noted with tip the in the proximal stomach approximately 7.9 cm below the GE junction. Bowel gas pattern appears nonspecific with no dilated loops of small bowel. IMPRESSION: Enteric tube tip is in the proximal stomach approximately 7.9 cm below the GE junction. Electronically Signed   By: Taylor  Stroud M.D.   On: 06/28/2022 11:50   DG Abd 1 View  Result Date: 06/28/2022 CLINICAL DATA:  OG tube placement. EXAM: ABDOMEN - 1 VIEW COMPARISON:  06/27/2022 FINDINGS: Portal view upper abdomen demonstrates NG tube tip in the mid stomach with proximal side port position below the GE junction. Visualized upper abdomen demonstrates nonspecific gas pattern. IMPRESSION: NG tube tip is in the mid stomach. Electronically Signed   By: Eric  Mansell M.D.   On: 06/28/2022 09:26   DG Chest Port 1 View  Result Date: 06/28/2022 CLINICAL DATA:    Respiratory failure. EXAM: PORTABLE CHEST 1 VIEW COMPARISON:  06/27/2022 FINDINGS: Endotracheal tube tip is 3.5 cm above the base of the carina. NG tube courses obscured by the overlying cardiac monitoring device. Right PICC line tip overlies the region of the SVC/RA junction. The cardio pericardial silhouette is  enlarged. Interval improvement in aeration at the left base with some persistent left base atelectasis or infiltrate. No overt pulmonary edema. No substantial pleural effusion. Telemetry leads overlie the chest. IMPRESSION: Interval improvement in aeration at the left base with some persistent left base atelectasis or infiltrate. Electronically Signed   By: Eric  Mansell M.D.   On: 06/28/2022 09:24   CT HEAD WO CONTRAST (5MM)  Result Date: 06/28/2022 CLINICAL DATA:  Altered mental status EXAM: CT HEAD WITHOUT CONTRAST TECHNIQUE: Contiguous axial images were obtained from the base of the skull through the vertex without intravenous contrast. RADIATION DOSE REDUCTION: This exam was performed according to the departmental dose-optimization program which includes automated exposure control, adjustment of the mA and/or kV according to patient size and/or use of iterative reconstruction technique. COMPARISON:  03/13/2012 FINDINGS: Brain: There is no mass, hemorrhage or extra-axial collection. The size and configuration of the ventricles and extra-axial CSF spaces are normal. There is hypoattenuation of the white matter, most commonly indicating chronic small vessel disease. Old left cerebellar infarct. Vascular: No abnormal hyperdensity of the major intracranial arteries or dural venous sinuses. No intracranial atherosclerosis. Skull: The visualized skull base, calvarium and extracranial soft tissues are normal. Sinuses/Orbits: Bilateral ostiomeatal complex pattern sinus opacification. The orbits are normal. IMPRESSION: 1. No acute intracranial abnormality. 2. 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 3. Bilateral ostiomeatal complex pattern sinus opacification. Electronically Signed   By: Kevin  Herman M.D.   On: 06/28/2022 01:13   DG CHEST PORT 1 VIEW  Result Date: 06/27/2022 CLINICAL DATA:  Hypoxia EXAM: PORTABLE CHEST 1 VIEW COMPARISON:  Chest x-ray 06/27/2022, 02/17/2019  FINDINGS: Endotracheal tube tip is about 5 cm superior to carina. Cardiomegaly with consolidation at the left lung base and possible effusion. Right upper extremity central venous catheter tip at the SVC. No pneumothorax. IMPRESSION: 1. Endotracheal tube tip about 5 cm superior to carina. 2. Cardiomegaly with consolidation at the left lung base and possible effusion. Electronically Signed   By: Kim  Fujinaga M.D.   On: 06/27/2022 19:58   DG Abd Portable 1V  Result Date: 06/27/2022 CLINICAL DATA:  Evaluate NG tube placement EXAM: PORTABLE ABDOMEN - 1 VIEW COMPARISON:  None Available. FINDINGS: Bowel gas pattern in the upper abdomen is unremarkable. There is no demonstrable NG tube in the course of thoracic esophagus and stomach. Possibility of NG tube being coiled within pharynx should be considered. IMPRESSION: NG tube is not seen.Possibility of NG tube being coiled within pharynx should be considered. Electronically Signed   By: Palani  Rathinasamy M.D.   On: 06/27/2022 18:53   DG Chest Port 1 View  Result Date: 06/27/2022 CLINICAL DATA:  Difficulty breathing EXAM: PORTABLE CHEST 1 VIEW COMPARISON:  Previous studies including the examination done earlier today FINDINGS: Transverse diameter of heart is increased. There is increased opacity in left lung. No focal infiltrates are seen in right lung. There is blunting of left lateral CP angle. Right lateral CP angle is clear. There is no evidence of pneumothorax. Tip of endotracheal tube is 4 cm above the carina. NG tube is not seen. Tip of PICC line is seen in the region of superior vena cava. IMPRESSION: Cardiomegaly.   There is decreased aeration in left lung suggesting atelectasis/pneumonia. Possibility of obstruction of central left bronchus by mucous plugs is not excluded. No focal infiltrates are seen in right lung. Tip of endotracheal tube is 4 cm above the carina. NG tube is not seen. Electronically Signed   By: Palani  Rathinasamy M.D.   On: 06/27/2022  18:51   DG Abd 1 View  Result Date: 06/27/2022 CLINICAL DATA:  Abdominal pain. EXAM: ABDOMEN - 1 VIEW COMPARISON:  None Available. FINDINGS: Divided views of the abdomen obtained. No bowel dilatation to suggest obstruction. Minimal formed stool in the colon. Calcification in the right pelvis typical of phleboliths. More detailed assessment is limited due to portable technique and body habitus. IMPRESSION: Normal bowel gas pattern. Electronically Signed   By: Melanie  Sanford M.D.   On: 06/27/2022 16:44   DG Chest Port 1 View  Result Date: 06/27/2022 CLINICAL DATA:  Shortness of breath. EXAM: PORTABLE CHEST 1 VIEW COMPARISON:  Radiograph earlier today. FINDINGS: There is a new right upper extremity PICC with tip overlying the lower SVC. No pneumothorax. Cardiomegaly is stable. Worsening peribronchial and interstitial thickening likely pulmonary edema. There may be small pleural effusions. No focal airspace disease. IMPRESSION: 1. Tip of the new right upper extremity PICC projects over the lower SVC. 2. Worsening pulmonary edema. Stable cardiomegaly. Possible small pleural effusions. Electronically Signed   By: Melanie  Sanford M.D.   On: 06/27/2022 16:42     Medications:     Current Medications:  arformoterol  15 mcg Nebulization BID   Chlorhexidine Gluconate Cloth  6 each Topical Daily   colchicine  0.6 mg Per Tube BID   docusate  100 mg Per Tube BID   feeding supplement (PROSource TF20)  60 mL Per Tube TID   furosemide  80 mg Intravenous Once   pantoprazole (PROTONIX) IV  40 mg Intravenous Daily   polyethylene glycol  17 g Per Tube Daily   revefenacin  175 mcg Nebulization Daily   sodium chloride flush  10-40 mL Intracatheter Q12H   sodium chloride flush  3 mL Intravenous Q12H    Infusions:  sodium chloride 250 mL (06/28/22 1055)   cefTRIAXone (ROCEPHIN)  IV Stopped (06/27/22 2151)   feeding supplement (VITAL 1.5 CAL)     fentaNYL infusion INTRAVENOUS 50 mcg/hr (06/28/22 1430)    heparin 2,000 Units/hr (06/28/22 0800)   levETIRAcetam Stopped (06/28/22 0244)   metronidazole 500 mg (06/28/22 1058)   norepinephrine (LEVOPHED) Adult infusion 5 mcg/min (06/28/22 0800)   potassium chloride 10 mEq (06/28/22 1323)   propofol (DIPRIVAN) infusion 9 mcg/kg/min (06/28/22 0800)     Assessment/Plan   1. Acute on chronic systolic HF - onset  2021  - Last echo 06/2021 showed EF 30-35% thought to be HTN - Echo this admit EF 20-25% - ECG suggestive of myopericarditis. - No CP - hsTrop 153 -> 219 -> 302 -> 330 -> 591 - On NE 5 Co-ox 76% - CVP 7 - hold lasix - start GDMT as BP tolerates - will plan cMRI when extubated. Can consider R/L cath as needed  2. ST elevation on ECG -> likely myopericarditis - denies CP - hstrop moderately elevated but flat.  - suspect myopericarditis. Will need cMRI - start colchicine - cMRI as above  3. A/c hypoxic hypoxic respiratory failure - likely due to HF/PNA  4. Encephalopathy - resolved - CCM managing  5. AKI - likely volume depletion - hold diuretics  6. Hypokalemia - supp   D/w   Drs. Nishan and Mark Garcia personally   CRITICAL CARE Performed by: Cami Delawder  Total critical care time: 55 minutes  Critical care time was exclusive of separately billable procedures and treating other patients.  Critical care was necessary to treat or prevent imminent or life-threatening deterioration.  Critical care was time spent personally by me (independent of midlevel providers or residents) on the following activities: development of treatment plan with patient and/or surrogate as well as nursing, discussions with consultants, evaluation of patient's response to treatment, examination of patient, obtaining history from patient or surrogate, ordering and performing treatments and interventions, ordering and review of laboratory studies, ordering and review of radiographic studies, pulse oximetry and re-evaluation of patient's  condition.     Length of Stay: 1  Yailene Badia, MD  06/28/2022, 2:38 PM  Advanced Heart Failure Team Pager [phone] (M-F; 7a - 5p)  Please contact [facility] for night-coverage after hours (4p -7a ) and weekends on [email or web address]  

## 2022-06-28 NOTE — Progress Notes (Signed)
Pt transported to Cath lab and back without incident

## 2022-06-28 NOTE — Progress Notes (Signed)
RT and RN  took pt from 2H 13 to CT and back to 2H13 with no complications.

## 2022-06-28 NOTE — Progress Notes (Signed)
NAME:  Mark Garcia, MRN:  [id number], DOB:  January 02, 1986, LOS: 1 ADMISSION DATE:  06/27/2022, CONSULTATION DATE:  06/27/2022 REFERRING MD:  Dr. Katrinka Blazing, [facility], CHIEF COMPLAINT:  AMS   History of Present Illness:  37 yo male presented to ER with dyspnea and leg swelling.  Hx of HFrEF, HTN, Seizures but wasn't able to take medications consistently--has been out of medications for months.  Had elevated troponin.  Started on lasix and cardiology consulted.  Hd nausea with vomiting.  Developed lethargy, which by report from his wife had been going on for several days prior to admission but worsening.  VBG showed hypercapnia.  PCCM consulted and transferred to ICU.  Pertinent  Medical History  HTN, HFrEF, Seizures  Significant Hospital Events: Including procedures, antibiotic start and stop dates in addition to other pertinent events   5/24 Admit, cardiology consult, transfer to ICU and start Bipap.  Required intubation for progressive encephalopathy.  Interim History / Subjective:  Remains on propofol, fentanyl, norepinephrine, heparin.  Afebrile overnight.  Objective   Blood pressure 105/68, pulse 69, temperature 98.6 F (37 C), temperature source Oral, resp. rate (!) 26, height 5\' 11"  (1.803 m), weight 130.6 kg, SpO2 93 %.    Vent Mode: PRVC FiO2 (%):  [40 %-100 %] 60 % Set Rate:  [22 bmp] 22 bmp Vt Set:  [600 mL] 600 mL PEEP:  [5 cmH20-10 cmH20] 10 cmH20 Plateau Pressure:  [28 cmH20-34 cmH20] 28 cmH20   Intake/Output Summary (Last 24 hours) at 06/28/2022 1000 Last data filed at 06/28/2022 0800 Gross per 24 hour  Intake 1345.94 ml  Output 1350 ml  Net -4.06 ml    Filed Weights   06/27/22 0715 06/28/22 0500  Weight: 133.4 kg 130.6 kg    Examination:  General -chronically ill-appearing man lying in bed no acute distress HENT -[place]/AT, eyes anicteric, endotracheal tube in orogastric tube in place.  Moist oral mucosa. Cardiac -S1-S2, regular rate and rhythm Chest -rhonchi, no wheezing.   Thin secretions Abdomen -soft, nontender, nondistended Extremities -mild peripheral edema Skin -warm, dry, no diffuse rashes Neuro -RASS -2, follows commands in all extremities  Trop 591, rising Na+ 134 K+ 2.7 BUN 17 Cr 1.44 ABG    Component Value Date/Time   PHART 7.501 (H) 06/28/2022 0028   PCO2ART 36.8 06/28/2022 0028   PO2ART 123 (H) 06/28/2022 0028   HCO3 28.9 (H) 06/28/2022 0028   TCO2 30 06/28/2022 0028   O2SAT 76.1 06/28/2022 0448    CXR personally reviewed-resolving left lower lobe infiltrate -Respiratory culture-no WBC, rare GPC, GNR Blood culture-no growth to date  Resolved Hospital Problem list     Assessment & Plan:   Acute metabolic encephalopathy mostly likely related to hypercapnia in setting of sleep disordered breathing.  Wonder if antiemetics had an excessive sedating effect on him. Hx of seizures. -PAD protocol - Avoid deliriogenic meds were able  Acute on chronic hypoxic & hypercapnic respiratory failure with presumed obstructive sleep apnea and obesity hypoventilation syndrome, with acute pulmonary edema. Left aspiration pneumonia Possible asthma -LTVV - VAP prevention protocol - PAD protocol for sedation - Daily SAT and SBT as appropriate.  FiO2 needs to be weaned down further before this is appropriate> down to 45%, PEEP of 10. -Empiric antibiotics -Adding bronchodilators to help with obstruction on the vent.  Respiratory rate decreased.  Would likely benefit from outpatient PFTs.  Acute on chronic HFrEF Elevated troponin from demand ischemia Hx of HTN -Appreciate cardiology's management.  Continue Lasix.  Requiring NE  for hypotension. Serial cooximetry.  CVP monitoring. - Will need to be started back on guideline directed heart failure therapy throughout this admission. - Eventually needs ischemia evaluation. - Not a candidate for ICD due to medication noncompliance and poor follow-up in the past  Hypomagnesemia, resolved Continue to  monitor, replete as needed  Hypokalemia - Repleted - Continue to monitor -Recheck this afternoon  AKI -Strict I's/O - Renally dose meds avoid nephrotoxic meds - Maintain adequate perfusion  At risk for malnutrition - Start tube feeds  Wife updated at bedside during rounds.  Best Practice (right click and "Reselect all SmartList Selections" daily)   Diet/type: tubefeeds DVT prophylaxis: systemic heparin GI prophylaxis: PPI Lines: Central line and Arterial Line Foley:  Yes, and it is still needed Code Status:  full code Last date of multidisciplinary goals of care discussion [updated his wife at bedside]  Labs   CBC: Recent Labs  Lab 06/27/22 0319 06/28/22 0028  WBC 6.9  --   NEUTROABS 4.1  --   HGB 13.0 13.9  HCT 40.0 41.0  MCV 86.0  --   PLT 199  --      Basic Metabolic Panel: Recent Labs  Lab 06/27/22 0319 06/27/22 0933 06/27/22 2205 06/28/22 0028 06/28/22 0342  NA 139  --   --  139 134*  K 3.9  --   --  3.0* 2.7*  CL 103  --   --   --  95*  CO2 29  --   --   --  27  GLUCOSE 101*  --   --   --  165*  BUN 14  --   --   --  17  CREATININE 0.96  --   --   --  1.44*  CALCIUM 9.5  --   --   --  9.0  MG  --  1.4* 2.4  --  2.0    GFR: Estimated Creatinine Clearance: 94.9 mL/min (A) (by C-G formula based on SCr of 1.44 mg/dL (H)). Recent Labs  Lab 06/27/22 0319 06/27/22 1230 06/27/22 1440  WBC 6.9  --   --   LATICACIDVEN  --  1.2 1.1     Liver Function Tests: Recent Labs  Lab 06/27/22 2205  AST 44*  ALT 42  ALKPHOS 44  BILITOT 0.7  PROT 7.3  ALBUMIN 3.5   No results for input(s): "LIPASE", "AMYLASE" in the last 168 hours. Recent Labs  Lab 06/27/22 1418  AMMONIA 33    ABG    Component Value Date/Time   PHART 7.501 (H) 06/28/2022 0028   PCO2ART 36.8 06/28/2022 0028   PO2ART 123 (H) 06/28/2022 0028   HCO3 28.9 (H) 06/28/2022 0028   TCO2 30 06/28/2022 0028   O2SAT 76.1 06/28/2022 0448         Critical care time:    This  patient is critically ill with multiple organ system failure which requires frequent high complexity decision making, assessment, support, evaluation, and titration of therapies. This was completed through the application of advanced monitoring technologies and extensive interpretation of multiple databases. During this encounter critical care time was devoted to patient care services described in this note for 35 minutes.  Steffanie Dunn, DO 06/28/22 11:09 AM [facility]  For contact information, see Amion. If no response to pager, please call PCCM consult pager. After hours, 7PM- 7AM, please call Elink.

## 2022-06-28 NOTE — Progress Notes (Signed)
Tele with ST elevations> EKG reviewed. PR depressions, ST elevations . QTC and QRS both longer.  Bedside echo done with Cardiology- EF remains low but no RWMA.  Repeat trop now. Start colchicine.   Steffanie Dunn, DO 06/28/22 12:51 PM [facility]  For contact information, see Amion. If no response to pager, please call PCCM consult pager. After hours, 7PM- 7AM, please call Elink.

## 2022-06-28 NOTE — Interval H&P Note (Signed)
History and Physical Interval Note:  06/28/2022 8:23 PM  Mark Garcia  has presented today for surgery, with the diagnosis of ST Elevation with CHF.  The various methods of treatment have been discussed with the patient and family. After consideration of risks, benefits and other options for treatment, the patient has consented to  Procedure(s): Coronary/Graft Acute MI Revascularization (N/A) LEFT HEART CATH AND CORONARY ANGIOGRAPHY (N/A)  PERCUTANEOUS CORONARY INTERVENTION  as a surgical intervention.  The patient's history has been reviewed, patient examined, no change in status, stable for surgery.  I have reviewed the patient's chart and labs.  Questions were answered to the patient's satisfaction.    Cath Lab Visit (complete for each Cath Lab visit)  Clinical Evaluation Leading to the Procedure:   ACS: Yes.    Non-ACS:    Anginal Classification: No Symptoms  Anti-ischemic medical therapy: Maximal Therapy (2 or more classes of medications)  Non-Invasive Test Results: No non-invasive testing performed  Prior CABG: No previous CABG    Bryan Lemma

## 2022-06-28 NOTE — Progress Notes (Signed)
Date and time results received: 06/28/22 1545  Test: Troponin  Critical Value: 1,107   Name of Provider Notified: Karie Fetch, DO and Arvilla Meres, MD   Repeat labs ordered

## 2022-06-28 NOTE — Progress Notes (Signed)
ANTICOAGULATION CONSULT NOTE  Pharmacy Consult for Heparin Indication: chest pain/ACS  No Known Allergies  Patient Measurements: Height: 5\' 11"  (180.3 cm) Weight: 133.4 kg (294 lb) IBW/kg (Calculated) : 75.3 Heparin Dosing Weight: 98 kg  Vital Signs: Temp: 98.4 F (36.9 C) (05/25 0300) Temp Source: Axillary (05/25 0300) BP: 112/67 (05/25 0400) Pulse Rate: 66 (05/25 0400)  Labs: Recent Labs    06/27/22 0319 06/27/22 0440 06/27/22 0919 06/27/22 1112 06/27/22 1800 06/28/22 0028 06/28/22 0342  HGB 13.0  --   --   --   --  13.9  --   HCT 40.0  --   --   --   --  41.0  --   PLT 199  --   --   --   --   --   --   APTT  --   --   --   --  47*  --  47*  HEPARINUNFRC  --   --   --   --   --   --  0.34  CREATININE 0.96  --   --   --   --   --  1.44*  TROPONINIHS 153*   < > 302* 330* 591*  --   --    < > = values in this interval not displayed.     Estimated Creatinine Clearance: 96 mL/min (A) (by C-G formula based on SCr of 1.44 mg/dL (H)).   Medical History: Past Medical History:  Diagnosis Date   Chronic HFrEF (heart failure with reduced ejection fraction) (HCC)    Hypertension    Morbid obesity (HCC)    Seizures (HCC)     Assessment: 37 YOM not on anticoagulation PTA who presented with SOB with troponins trending up. Pharmacy consulted to manage heparin for ACS.  CBC stable, received x1 dose enoxaparin 60mg  SQ @1030  on 5/24 and patient with AKI (sCr up 0.96>1.44)  aPTT 47 subtherapeutic and heparin level 0.34 on 1800 units/hr. No issues with the infusion or overt s/sx of bleeding per RN- unclear how much the enoxaparin dose from yesterday is still impacting the heparin level. Since aPTT is low and heparin level is on the lower end of therapeutic range will increase rate conservatively and recheck both levels until they correlate.   Goal of Therapy:  Heparin level 0.3-0.7 units/ml aPTT 66-102 seconds Monitor platelets by anticoagulation protocol: Yes   Plan:   Increase heparin infusion rate to 2000 units/hr Check aPTT and heparin level in 6 hours Monitor daily CBC, aPTT and heparin levels until correlating, and for s/sx of bleeding   Thank you for allowing pharmacy to be a part of this patient's care.  Ruben Im, PharmD Clinical Pharmacist 06/28/2022 5:28 AM Please check AMION for all [facility] Pharmacy numbers

## 2022-06-28 NOTE — Progress Notes (Signed)
Cardiologist:  [place]  Subjective:  Intubated on propofol  Objective:  Vitals:   06/28/22 0745 06/28/22 0800 06/28/22 0805 06/28/22 0815  BP: 96/85 (!) 86/76 105/68   Pulse: 70 78 76 69  Resp: (!) 22 (!) 22 (!) 22 (!) 26  Temp:    98.6 F (37 C)  TempSrc:    Oral  SpO2: 95% 95% 97% 93%  Weight:      Height:        Intake/Output from previous day:  Intake/Output Summary (Last 24 hours) at 06/28/2022 0943 Last data filed at 06/28/2022 0800 Gross per 24 hour  Intake 1345.94 ml  Output 1350 ml  Net -4.06 ml    Physical Exam:  Obese black male Intubated Decreased BS left base No murmur  Abdomen benign Trace edema  Lab Results: Basic Metabolic Panel: Recent Labs    06/27/22 0319 06/27/22 0933 06/27/22 2205 06/28/22 0028 06/28/22 0342  NA 139  --   --  139 134*  K 3.9  --   --  3.0* 2.7*  CL 103  --   --   --  95*  CO2 29  --   --   --  27  GLUCOSE 101*  --   --   --  165*  BUN 14  --   --   --  17  CREATININE 0.96  --   --   --  1.44*  CALCIUM 9.5  --   --   --  9.0  MG  --    < > 2.4  --  2.0   < > = values in this interval not displayed.   Liver Function Tests: Recent Labs    06/27/22 2205  AST 44*  ALT 42  ALKPHOS 44  BILITOT 0.7  PROT 7.3  ALBUMIN 3.5   No results for input(s): "LIPASE", "AMYLASE" in the last 72 hours. CBC: Recent Labs    06/27/22 0319 06/28/22 0028  WBC 6.9  --   NEUTROABS 4.1  --   HGB 13.0 13.9  HCT 40.0 41.0  MCV 86.0  --   PLT 199  --    Cardiac Enzymes: No results for input(s): "CKTOTAL", "CKMB", "CKMBINDEX", "TROPONINI" in the last 72 hours. BNP: Invalid input(s): "POCBNP" D-Dimer: No results for input(s): "DDIMER" in the last 72 hours. Hemoglobin A1C: No results for input(s): "HGBA1C" in the last 72 hours. Fasting Lipid Panel: Recent Labs    06/27/22 0919 06/28/22 0342  CHOL 129  --   HDL 42  --   LDLCALC 74  --   TRIG 64 442*  CHOLHDL 3.1  --    Thyroid Function Tests: Recent Labs     06/27/22 0933  TSH 2.244     Imaging: DG Abd 1 View  Result Date: 06/28/2022 CLINICAL DATA:  OG tube placement. EXAM: ABDOMEN - 1 VIEW COMPARISON:  06/27/2022 FINDINGS: Portal view upper abdomen demonstrates NG tube tip in the mid stomach with proximal side port position below the GE junction. Visualized upper abdomen demonstrates nonspecific gas pattern. IMPRESSION: NG tube tip is in the mid stomach. Electronically Signed   By: Kennith Center M.D.   On: 06/28/2022 09:26   DG Chest Port 1 View  Result Date: 06/28/2022 CLINICAL DATA:  Respiratory failure. EXAM: PORTABLE CHEST 1 VIEW COMPARISON:  06/27/2022 FINDINGS: Endotracheal tube tip is 3.5 cm above the base of the carina. NG tube courses obscured by the overlying cardiac monitoring device. Right PICC line tip  overlies the region of the SVC/RA junction. The cardio pericardial silhouette is enlarged. Interval improvement in aeration at the left base with some persistent left base atelectasis or infiltrate. No overt pulmonary edema. No substantial pleural effusion. Telemetry leads overlie the chest. IMPRESSION: Interval improvement in aeration at the left base with some persistent left base atelectasis or infiltrate. Electronically Signed   By: Kennith Center M.D.   On: 06/28/2022 09:24   CT HEAD WO CONTRAST ( )  Result Date: 06/28/2022 CLINICAL DATA:  Altered mental status EXAM: CT HEAD WITHOUT CONTRAST TECHNIQUE: Contiguous axial images were obtained from the base of the skull through the vertex without intravenous contrast. RADIATION DOSE REDUCTION: This exam was performed according to the departmental dose-optimization program which includes automated exposure control, adjustment of the mA and/or kV according to patient size and/or use of iterative reconstruction technique. COMPARISON:  03/13/2012 FINDINGS: Brain: There is no mass, hemorrhage or extra-axial collection. The size and configuration of the ventricles and extra-axial CSF spaces are  normal. There is hypoattenuation of the white matter, most commonly indicating chronic small vessel disease. Old left cerebellar infarct. Vascular: No abnormal hyperdensity of the major intracranial arteries or dural venous sinuses. No intracranial atherosclerosis. Skull: The visualized skull base, calvarium and extracranial soft tissues are normal. Sinuses/Orbits: Bilateral ostiomeatal complex pattern sinus opacification. The orbits are normal. IMPRESSION: 1. No acute intracranial abnormality. 2. 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 3. Bilateral ostiomeatal complex pattern sinus opacification. Electronically Signed   By: Deatra Robinson M.D.   On: 06/28/2022 01:13   DG CHEST PORT 1 VIEW  Result Date: 06/27/2022 CLINICAL DATA:  Hypoxia EXAM: PORTABLE CHEST 1 VIEW COMPARISON:  Chest x-ray 06/27/2022, 02/17/2019 FINDINGS: Endotracheal tube tip is about 5 cm superior to carina. Cardiomegaly with consolidation at the left lung base and possible effusion. Right upper extremity central venous catheter tip at the SVC. No pneumothorax. IMPRESSION: 1. Endotracheal tube tip about 5 cm superior to carina. 2. Cardiomegaly with consolidation at the left lung base and possible effusion. Electronically Signed   By: Jasmine Pang M.D.   On: 06/27/2022 19:58   DG Abd Portable 1V  Result Date: 06/27/2022 CLINICAL DATA:  Evaluate NG tube placement EXAM: PORTABLE ABDOMEN - 1 VIEW COMPARISON:  None Available. FINDINGS: Bowel gas pattern in the upper abdomen is unremarkable. There is no demonstrable NG tube in the course of thoracic esophagus and stomach. Possibility of NG tube being coiled within pharynx should be considered. IMPRESSION: NG tube is not seen.Possibility of NG tube being coiled within pharynx should be considered. Electronically Signed   By: Ernie Avena M.D.   On: 06/27/2022 18:53   DG Chest Port 1 View  Result Date: 06/27/2022 CLINICAL DATA:  Difficulty breathing  EXAM: PORTABLE CHEST 1 VIEW COMPARISON:  Previous studies including the examination done earlier today FINDINGS: Transverse diameter of heart is increased. There is increased opacity in left lung. No focal infiltrates are seen in right lung. There is blunting of left lateral CP angle. Right lateral CP angle is clear. There is no evidence of pneumothorax. Tip of endotracheal tube is 4 cm above the carina. NG tube is not seen. Tip of PICC line is seen in the region of superior vena cava. IMPRESSION: Cardiomegaly. There is decreased aeration in left lung suggesting atelectasis/pneumonia. Possibility of obstruction of central left bronchus by mucous plugs is not excluded. No focal infiltrates are seen in right lung. Tip of endotracheal tube is 4 cm  above the carina. NG tube is not seen. Electronically Signed   By: Ernie Avena M.D.   On: 06/27/2022 18:51   DG Abd 1 View  Result Date: 06/27/2022 CLINICAL DATA:  Abdominal pain. EXAM: ABDOMEN - 1 VIEW COMPARISON:  None Available. FINDINGS: Divided views of the abdomen obtained. No bowel dilatation to suggest obstruction. Minimal formed stool in the colon. Calcification in the right pelvis typical of phleboliths. More detailed assessment is limited due to portable technique and body habitus. IMPRESSION: Normal bowel gas pattern. Electronically Signed   By: Narda Rutherford M.D.   On: 06/27/2022 16:44   DG Chest Port 1 View  Result Date: 06/27/2022 CLINICAL DATA:  Shortness of breath. EXAM: PORTABLE CHEST 1 VIEW COMPARISON:  Radiograph earlier today. FINDINGS: There is a new right upper extremity PICC with tip overlying the lower SVC. No pneumothorax. Cardiomegaly is stable. Worsening peribronchial and interstitial thickening likely pulmonary edema. There may be small pleural effusions. No focal airspace disease. IMPRESSION: 1. Tip of the new right upper extremity PICC projects over the lower SVC. 2. Worsening pulmonary edema. Stable cardiomegaly. Possible  small pleural effusions. Electronically Signed   By: Narda Rutherford M.D.   On: 06/27/2022 16:42   [place] EKG SITE RITE  Result Date: 06/27/2022 If Site Rite image not attached, placement could not be confirmed due to current cardiac rhythm.  ECHOCARDIOGRAM COMPLETE  Result Date: 06/27/2022    ECHOCARDIOGRAM REPORT   Patient Name:   Mark Garcia Date of Exam: 06/27/2022 Medical Rec #:  [id number]   Height:       71.0 in Accession #:    [id number]  Weight:       294.0 lb Date of Birth:  1985/10/02   BSA:          2.484 m Patient Age:    37 years    BP:           157/104 mmHg Patient Gender: M           HR:           76 bpm. Exam Location:  Inpatient Procedure: 2D Echo, Cardiac Doppler, Color Doppler and Intracardiac            Opacification Agent Indications:    CHF-Acute Systolic I50.21  History:        Patient has prior history of Echocardiogram examinations, most                 recent 06/12/2021. CHF, Arrythmias:Bradycardia; Risk                 Factors:Hypertension.  Sonographer:    Lucendia Herrlich Referring Phys: [id number] RONDELL A SMITH IMPRESSIONS  1. Left ventricular ejection fraction, by estimation, is 20 to 25%. The left ventricle has severely decreased function. The left ventricle has no regional wall motion abnormalities. The left ventricular internal cavity size was moderately dilated. There  is mild concentric left ventricular hypertrophy. Left ventricular diastolic parameters are consistent with Grade II diastolic dysfunction (pseudonormalization).  2. Right ventricular systolic function is normal. The right ventricular size is normal.  3. Left atrial size was mildly dilated.  4. The mitral valve is normal in structure. Trivial mitral valve regurgitation. No evidence of mitral stenosis.  5. The aortic valve is normal in structure. Aortic valve regurgitation is not visualized. No aortic stenosis is present.  6. The inferior vena cava is dilated in size with >50% respiratory variability, suggesting  right atrial pressure of 8 mmHg.  FINDINGS  Left Ventricle: Left ventricular ejection fraction, by estimation, is 20 to 25%. The left ventricle has severely decreased function. The left ventricle has no regional wall motion abnormalities. Definity contrast agent was given IV to delineate the left  ventricular endocardial borders. The left ventricular internal cavity size was moderately dilated. There is mild concentric left ventricular hypertrophy. Left ventricular diastolic parameters are consistent with Grade II diastolic dysfunction (pseudonormalization). Right Ventricle: The right ventricular size is normal. No increase in right ventricular wall thickness. Right ventricular systolic function is normal. Left Atrium: Left atrial size was mildly dilated. Right Atrium: Right atrial size was normal in size. Pericardium: There is no evidence of pericardial effusion. Mitral Valve: The mitral valve is normal in structure. Trivial mitral valve regurgitation. No evidence of mitral valve stenosis. Tricuspid Valve: The tricuspid valve is not well visualized. Tricuspid valve regurgitation is not demonstrated. No evidence of tricuspid stenosis. Aortic Valve: The aortic valve is normal in structure. Aortic valve regurgitation is not visualized. No aortic stenosis is present. Aortic valve peak gradient measures 6.0 mmHg. Pulmonic Valve: The pulmonic valve was not well visualized. Pulmonic valve regurgitation is not visualized. No evidence of pulmonic stenosis. Aorta: The aortic root is normal in size and structure. Venous: The inferior vena cava is dilated in size with greater than 50% respiratory variability, suggesting right atrial pressure of 8 mmHg. IAS/Shunts: No atrial level shunt detected by color flow Doppler.  LEFT VENTRICLE PLAX 2D LVIDd:         7.10 cm   Diastology LVIDs:         5.90 cm   LV e' medial:    4.79 cm/s LV PW:         1.60 cm   LV E/e' medial:  25.9 LV IVS:        1.00 cm   LV e' lateral:   6.76 cm/s  LVOT diam:     2.30 cm   LV E/e' lateral: 18.3 LV SV:         92 LV SV Index:   37 LVOT Area:     4.15 cm  RIGHT VENTRICLE             IVC RV S prime:     19.80 cm/s  IVC diam: 2.10 cm TAPSE (M-mode): 3.1 cm LEFT ATRIUM             Index        RIGHT ATRIUM           Index LA diam:        5.60 cm 2.25 cm/m   RA Area:     14.40 cm LA Vol (A2C):   91.2 ml 36.72 ml/m  RA Volume:   33.90 ml  13.65 ml/m LA Vol (A4C):   98.7 ml 39.74 ml/m LA Biplane Vol: 98.1 ml 39.50 ml/m  AORTIC VALVE AV Area (Vmax): 3.88 cm AV Vmax:        122.00 cm/s AV Peak Grad:   6.0 mmHg LVOT Vmax:      114.00 cm/s LVOT Vmean:     75.500 cm/s LVOT VTI:       0.222 m  AORTA Ao Root diam: 3.30 cm Ao Asc diam:  3.30 cm MITRAL VALVE MV Area (PHT): 6.71 cm     SHUNTS MV Decel Time: 113 msec     Systemic VTI:  0.22 m MV E velocity: 124.00 cm/s  Systemic Diam: 2.30 cm MV A velocity: 83.10 cm/s MV  E/A ratio:  1.49 Aditya Sabharwal Electronically signed by Dorthula Nettles Signature Date/Time: 06/27/2022/1:13:01 PM    Final    DG Chest 2 View  Result Date: 06/27/2022 CLINICAL DATA:  Shortness of breath. EXAM: CHEST - 2 VIEW COMPARISON:  February 17, 2019 FINDINGS: The cardiac silhouette is mildly enlarged and unchanged in size. Mild, diffusely increased lung markings are seen. There is no evidence of focal consolidation, pleural effusion or pneumothorax. The visualized skeletal structures are unremarkable. IMPRESSION: Mild cardiomegaly with findings suggestive of mild interstitial edema. Electronically Signed   By: Aram Candela M.D.   On: 06/27/2022 03:14    Cardiac Studies:  ECG: SR LVH poor R wave progression    Telemetry:  SR  Echo: EF 20-25% mild LVH RV normal trivial MR  Medications:    Chlorhexidine Gluconate Cloth  6 each Topical Daily   docusate  100 mg Per Tube BID   furosemide  80 mg Intravenous Once   pantoprazole (PROTONIX) IV  40 mg Intravenous Daily   polyethylene glycol  17 g Per Tube Daily   sodium chloride  flush  10-40 mL Intracatheter Q12H   sodium chloride flush  3 mL Intravenous Q12H      sodium chloride     cefTRIAXone (ROCEPHIN)  IV Stopped (06/27/22 2151)   fentaNYL infusion INTRAVENOUS 40 mcg/hr (06/28/22 0800)   heparin 2,000 Units/hr (06/28/22 0800)   levETIRAcetam Stopped (06/28/22 0244)   metronidazole Stopped (06/28/22 0511)   norepinephrine (LEVOPHED) Adult infusion 5 mcg/min (06/28/22 0800)   potassium chloride 10 mEq (06/28/22 0919)   propofol (DIPRIVAN) infusion 9 mcg/kg/min (06/28/22 0800)    Assessment/Plan:   DCM:  likely non ischemic chronic with worsening EF Coox 76.1% no need for inotropes Lasix written for Given history of non compliance not likely candidate for AICD. 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

## 2022-06-29 DIAGNOSIS — I214 Non-ST elevation (NSTEMI) myocardial infarction: Secondary | ICD-10-CM

## 2022-06-29 DIAGNOSIS — I420 Dilated cardiomyopathy: Secondary | ICD-10-CM | POA: Diagnosis not present

## 2022-06-29 DIAGNOSIS — J9602 Acute respiratory failure with hypercapnia: Secondary | ICD-10-CM | POA: Diagnosis not present

## 2022-06-29 DIAGNOSIS — I5023 Acute on chronic systolic (congestive) heart failure: Secondary | ICD-10-CM | POA: Diagnosis not present

## 2022-06-29 DIAGNOSIS — Z9911 Dependence on respirator [ventilator] status: Secondary | ICD-10-CM | POA: Diagnosis not present

## 2022-06-29 DIAGNOSIS — J9601 Acute respiratory failure with hypoxia: Secondary | ICD-10-CM | POA: Diagnosis present

## 2022-06-29 LAB — BASIC METABOLIC PANEL
Anion gap: 10 (ref 5–15)
BUN: 25 mg/dL — ABNORMAL HIGH (ref 6–20)
CO2: 26 mmol/L (ref 22–32)
Calcium: 8.6 mg/dL — ABNORMAL LOW (ref 8.9–10.3)
Chloride: 102 mmol/L (ref 98–111)
Creatinine, Ser: 1.33 mg/dL — ABNORMAL HIGH (ref 0.61–1.24)
GFR, Estimated: >60 mL/min (ref >60)
Glucose, Bld: 121 mg/dL — ABNORMAL HIGH (ref 70–99)
Potassium: 3.3 mmol/L — ABNORMAL LOW (ref 3.5–5.1)
Sodium: 138 mmol/L (ref 135–145)

## 2022-06-29 LAB — GLUCOSE, CAPILLARY
Glucose-Capillary: 102 mg/dL — ABNORMAL HIGH (ref 70–99)
Glucose-Capillary: 102 mg/dL — ABNORMAL HIGH (ref 70–99)
Glucose-Capillary: 135 mg/dL — ABNORMAL HIGH (ref 70–99)
Glucose-Capillary: 90 mg/dL (ref 70–99)
Glucose-Capillary: 93 mg/dL (ref 70–99)
Glucose-Capillary: 95 mg/dL (ref 70–99)

## 2022-06-29 LAB — PHOSPHORUS: Phosphorus: 2.6 mg/dL (ref 2.5–4.6)

## 2022-06-29 LAB — CBC
HCT: 39.7 % (ref 39.0–52.0)
Hemoglobin: 12.8 g/dL — ABNORMAL LOW (ref 13.0–17.0)
MCH: 28.4 pg (ref 26.0–34.0)
MCHC: 32.2 g/dL (ref 30.0–36.0)
MCV: 88 fL (ref 80.0–100.0)
Platelets: 201 10*3/uL (ref 150–400)
RBC: 4.51 MIL/uL (ref 4.22–5.81)
RDW: 13.3 % (ref 11.5–15.5)
WBC: 7.3 10*3/uL (ref 4.0–10.5)
nRBC: 0 % (ref 0.0–0.2)

## 2022-06-29 LAB — MAGNESIUM: Magnesium: 1.9 mg/dL (ref 1.7–2.4)

## 2022-06-29 LAB — CULTURE, RESPIRATORY W GRAM STAIN: Culture: NORMAL

## 2022-06-29 LAB — COOXEMETRY PANEL
Carboxyhemoglobin: 1.8 % — ABNORMAL HIGH (ref 0.5–1.5)
Methemoglobin: <0.7 % (ref 0.0–1.5)
O2 Saturation: 71.7 %
Total hemoglobin: 12.9 g/dL (ref 12.0–16.0)

## 2022-06-29 LAB — HEPARIN LEVEL (UNFRACTIONATED)
Heparin Unfractionated: 0.15 IU/mL — ABNORMAL LOW (ref 0.30–0.70)
Heparin Unfractionated: 0.25 IU/mL — ABNORMAL LOW (ref 0.30–0.70)

## 2022-06-29 LAB — CULTURE, BLOOD (SINGLE)

## 2022-06-29 MED ORDER — POTASSIUM CHLORIDE 20 MEQ PO PACK
20.0000 meq | PACK | ORAL | Status: AC
  Administered 2022-06-29 (×2): 20 meq
  Filled 2022-06-29 (×2): qty 1

## 2022-06-29 MED ORDER — POTASSIUM CHLORIDE CRYS ER 20 MEQ PO TBCR
40.0000 meq | EXTENDED_RELEASE_TABLET | Freq: Once | ORAL | Status: AC
  Administered 2022-06-29: 40 meq via ORAL
  Filled 2022-06-29: qty 2

## 2022-06-29 MED ORDER — GLYCOPYRROLATE 0.2 MG/ML IJ SOLN
0.1000 mg | Freq: Three times a day (TID) | INTRAMUSCULAR | Status: DC | PRN
  Administered 2022-06-29: 0.1 mg via INTRAVENOUS
  Filled 2022-06-29: qty 1

## 2022-06-29 MED ORDER — POTASSIUM CHLORIDE 10 MEQ/50ML IV SOLN
10.0000 meq | INTRAVENOUS | Status: AC
  Administered 2022-06-29 (×4): 10 meq via INTRAVENOUS
  Filled 2022-06-29 (×4): qty 50

## 2022-06-29 MED ORDER — MAGNESIUM SULFATE 2 GM/50ML IV SOLN
2.0000 g | Freq: Once | INTRAVENOUS | Status: AC
  Administered 2022-06-29: 2 g via INTRAVENOUS
  Filled 2022-06-29: qty 50

## 2022-06-29 MED ORDER — ATORVASTATIN CALCIUM 40 MG PO TABS
40.0000 mg | ORAL_TABLET | Freq: Every day | ORAL | Status: DC
  Administered 2022-06-29 – 2022-07-01 (×3): 40 mg via ORAL
  Filled 2022-06-29 (×3): qty 1

## 2022-06-29 NOTE — Progress Notes (Signed)
ANTICOAGULATION CONSULT NOTE  Pharmacy Consult for Heparin Indication: chest pain/ACS  No Known Allergies  Patient Measurements: Height: 5\' 11"  (180.3 cm) Weight: 131.6 kg (290 lb 2 oz) IBW/kg (Calculated) : 75.3 Heparin Dosing Weight: 98 kg  Vital Signs: Temp: 98 F (36.7 C) (05/26 1136) Temp Source: Oral (05/26 1136) BP: 110/77 (05/26 1245) Pulse Rate: 82 (05/26 1245)  Labs: Recent Labs    06/27/22 0319 06/27/22 0440 06/27/22 1800 06/28/22 0028 06/28/22 0342 06/28/22 1205 06/28/22 1206 06/28/22 1418 06/28/22 1744 06/28/22 2259 06/29/22 0339 06/29/22 1140  HGB 13.0  --   --  13.9  --   --   --   --   --  12.9* 12.8*  --   HCT 40.0  --   --  41.0  --   --   --   --   --  39.9 39.7  --   PLT 199  --   --   --   --   --   --   --   --  212 201  --   APTT  --   --  47*  --  47* 51*  --   --   --   --   --   --   HEPARINUNFRC  --   --   --   --  0.34  --  0.39  --   --   --   --  0.15*  CREATININE 0.96  --   --   --  1.44*  --   --  1.36* 1.46* 1.35* 1.33*  --   TROPONINIHS 153*   < > 591*  --   --   --   --  1,107* 2,350*  --   --   --    < > = values in this interval not displayed.     Estimated Creatinine Clearance: 103.2 mL/min (A) (by C-G formula based on SCr of 1.33 mg/dL (H)).   Medical History: Past Medical History:  Diagnosis Date   Chronic HFrEF (heart failure with reduced ejection fraction) (HCC)    Hypertension    Morbid obesity (HCC)    Seizures (HCC)     Assessment: 37 YOM not on anticoagulation PTA who presented with SOB with troponins trending up. Pharmacy consulted to manage heparin for ACS. 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 Heparin drip restarted until MRI this week  Heparin drip 2000 uts/hr with heparin level 0.15 < goal cbc stable   Goal of Therapy:  Heparin level 0.3-0.7 units/ml aPTT 66-102  seconds Monitor platelets by anticoagulation protocol: Yes   Plan:  Increase heparin drip 2200 uts/hr  CBC and heparin levels daily  Monitor  s/sx of bleeding   Leota Sauers Pharm.D. CPP, BCPS Clinical Pharmacist [phone] 06/29/2022 1:31 PM

## 2022-06-29 NOTE — Procedures (Signed)
Extubation Procedure Note  Patient Details:   Name: Derak Fenstermaker DOB: 07-Oct-1985 MRN: [id number]   Airway Documentation:    Vent end date: 06/29/22 Vent end time: 1043   Evaluation  O2 sats: stable throughout Complications: No apparent complications Patient did tolerate procedure well. Bilateral Breath Sounds: Diminished, Rhonchi   Yes pt extubated to 6 l/m [place] without difficulty.  Audrie Lia 06/29/2022, 10:44 AM

## 2022-06-29 NOTE — Progress Notes (Signed)
NAME:  Mark Garcia, MRN:  [id number], DOB:  January 06, 1986, LOS: 2 ADMISSION DATE:  06/27/2022, CONSULTATION DATE:  06/27/2022 REFERRING MD:  Dr. Katrinka Blazing, [facility], CHIEF COMPLAINT:  AMS   History of Present Illness:  37 yo male presented to ER with dyspnea and leg swelling.  Hx of HFrEF, HTN, Seizures but wasn't able to take medications consistently--has been out of medications for months.  Had elevated troponin.  Started on lasix and cardiology consulted.  Hd nausea with vomiting.  Developed lethargy, which by report from his wife had been going on for several days prior to admission but worsening.  VBG showed hypercapnia.  PCCM consulted and transferred to ICU.  Pertinent  Medical History  HTN, HFrEF, Seizures  Significant Hospital Events: Including procedures, antibiotic start and stop dates in addition to other pertinent events   5/24 Admit, cardiology consult, transfer to ICU and start Bipap.  Required intubation for progressive encephalopathy. 5/25 EKG changes, rising trop> LHC, no stents  Interim History / Subjective:  LHC last evening- no intervention required, but needs to remain on heparin.   Objective   Blood pressure 118/77, pulse 81, temperature 98.5 F (36.9 C), temperature source Oral, resp. rate (!) 33, height 5\' 11"  (1.803 m), weight 131.6 kg, SpO2 91 %. CVP:  [2 mmHg-21 mmHg] 5 mmHg  Vent Mode: PRVC FiO2 (%):  [40 %-44 %] 44 % Set Rate:  [20 bmp] 20 bmp Vt Set:  [550 mL] 550 mL PEEP:  [10 cmH20] 10 cmH20 Plateau Pressure:  [21 cmH20-24 cmH20] 22 cmH20   Intake/Output Summary (Last 24 hours) at 06/29/2022 1726 Last data filed at 06/29/2022 1709 Gross per 24 hour  Intake 3402.46 ml  Output 1075 ml  Net 2327.46 ml    Filed Weights   06/27/22 0715 06/28/22 0500 06/29/22 0615  Weight: 133.4 kg 130.6 kg 131.6 kg    Examination:  General -critically ill appearing man sitting up in bed in NAD HENT -[place]/AT, eyes anicteric Cardiac - S1S2, RRR Chest - strong cough, thick  tan secretions, breathing comfortably on 8/8 Abdomen - soft, NT Extremities -minimal edema Skin - warm, dry, no rashes Neuro - RASS 0, following all commands   LHC: Distal RPDA lesion is 90% stenosed -> thrombotic/embolic   LV end diastolic pressure is normal.   There is no aortic valve stenosis.   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 Essentially normal, large caliber patulous vessels very tortuous with a focal hazy thrombotic (possibly embolic) lesion in the very distal portion of the right PDA, otherwise no notable CAD the tortuosity. There was no culprit lesion unless this was the culprit lesion in the apical PDA.  More consistent however with thromboembolism. Relatively normal LVEDP of 16 mmHg   Na+ 138 K+ 3.3 BUN 25 Cr 1.33 CXR personally reviewed-resolving left lower lobe infiltrate -Respiratory culture-no WBC, rare GPC, GNR Blood culture-no growth to date  Resolved Hospital Problem list     Assessment & Plan:   Acute metabolic encephalopathy mostly likely related to hypercapnia in setting of sleep disordered breathing.  Wonder if antiemetics had an excessive sedating effect on him. Hx of seizures. -PAD protocol-- off sedation today  Acute on chronic hypoxic & hypercapnic respiratory failure with presumed obstructive sleep apnea and obesity hypoventilation syndrome, with acute pulmonary edema. Left aspiration pneumonia Possible asthma -LTVV -VAP prevention protocol -SAT & SBT, passed, extuabted to [place] -pulmonary hygiene -complete 7 days  of empiric antibiotics - con't bronchodilators given obstruction observed while he was on the vent  Acute on chronic HFrEF> NICM EKG changes due to NSTEMI Hx of HTN -Appreciate Cardiology & AHF's management -plavix, statin, heparin -adding Entresto, farxiga, eventually Bblocker as able with pressors coming off and renal failure -coox  daily - tele monitoring - Not a candidate for ICD in the past due to medication noncompliance and poor follow-up  Hypomagnesemia, resolved -repleted  Hypokalemia - Repleted - monitor -holding diuresis  AKI -strict I/O -renally dose meds, avoid nephrotoxic meds -monitor  History of epilepsy -keppra  At risk for malnutrition - diet   No family at bedside during rounds.  Best Practice (right click and "Reselect all SmartList Selections" daily)   Diet/type: Regular consistency (see orders) DVT prophylaxis: systemic heparin GI prophylaxis: PPI Lines: Central line and Arterial Line Foley:  Yes, and it is still needed Code Status:  full code Last date of multidisciplinary goals of care discussion [updated his wife at bedside]  Labs   CBC: Recent Labs  Lab 06/27/22 0319 06/28/22 0028 06/28/22 2259 06/29/22 0339  WBC 6.9  --  8.0 7.3  NEUTROABS 4.1  --   --   --   HGB 13.0 13.9 12.9* 12.8*  HCT 40.0 41.0 39.9 39.7  MCV 86.0  --  85.8 88.0  PLT 199  --  212 201     Basic Metabolic Panel: Recent Labs  Lab 06/27/22 0319 06/27/22 0933 06/27/22 2205 06/28/22 0028 06/28/22 0342 06/28/22 1418 06/28/22 1744 06/28/22 2259 06/29/22 0339  NA 139  --   --  139 134* 136 135  --  138  K 3.9  --   --  3.0* 2.7* 4.0 3.4*  --  3.3*  CL 103  --   --   --  95* 97* 98  --  102  CO2 29  --   --   --  27 28 26   --  26  GLUCOSE 101*  --   --   --  165* 111* 109*  --  121*  BUN 14  --   --   --  17 18 20   --  25*  CREATININE 0.96  --   --   --  1.44* 1.36* 1.46* 1.35* 1.33*  CALCIUM 9.5  --   --   --  9.0 9.1 9.0  --  8.6*  MG  --  1.4* 2.4  --  2.0  --   --   --  1.9  PHOS  --   --   --   --   --   --   --   --  2.6    GFR: Estimated Creatinine Clearance: 103.2 mL/min (A) (by C-G formula based on SCr of 1.33 mg/dL (H)). Recent Labs  Lab 06/27/22 0319 06/27/22 1230 06/27/22 1440 06/28/22 2259 06/29/22 0339  WBC 6.9  --   --  8.0 7.3  LATICACIDVEN  --  1.2 1.1  --    --         Critical care time:    This patient is critically ill with multiple organ system failure which requires frequent high complexity decision making, assessment, support, evaluation, and titration of therapies. This was completed through the application of advanced monitoring technologies and extensive interpretation of multiple databases. During this encounter critical care time was devoted to patient care services described in this note for 41 minutes.  Steffanie Dunn, DO 06/29/22 5:26 PM  [facility]  For contact information, see Amion. If no response to pager, please call PCCM consult pager. After hours, 7PM- 7AM, please call Elink.

## 2022-06-29 NOTE — Progress Notes (Signed)
[facility] ADULT ICU REPLACEMENT PROTOCOL   The patient does apply for the [facility] Adult ICU Electrolyte Replacment Protocol based on the criteria listed below:   1.Exclusion criteria: TCTS, ECMO, Dialysis, and Myasthenia Gravis patients 2. Is GFR >/= 30 ml/min? Yes.    Patient's GFR today is >60 3. Is SCr </= 2? Yes.   Patient's SCr is 1.33 mg/dL 4. Did SCr increase >/= 0.5 in 24 hours? No. 5.Pt's weight >40kg  Yes.   6. Abnormal electrolyte(s): K  7. Electrolytes replaced per protocol 8.  Call MD STAT for K+ </= 2.5, Phos </= 1, or Mag </= 1 Physician:  Alpha Gula [facility] 06/29/2022 4:56 AM

## 2022-06-29 NOTE — Progress Notes (Signed)
ANTICOAGULATION CONSULT NOTE  Pharmacy Consult for Heparin Indication: chest pain/ACS  No Known Allergies  Patient Measurements: Height: 5\' 11"  (180.3 cm) Weight: 131.6 kg (290 lb 2 oz) IBW/kg (Calculated) : 75.3 Heparin Dosing Weight: 98 kg  Vital Signs: Temp: 98.2 F (36.8 C) (05/26 2000) Temp Source: Oral (05/26 2000) BP: 120/84 (05/26 2100) Pulse Rate: 97 (05/26 2100)  Labs: Recent Labs    06/27/22 0319 06/27/22 0319 06/27/22 0440 06/27/22 1800 06/28/22 0028 06/28/22 0342 06/28/22 1205 06/28/22 1206 06/28/22 1418 06/28/22 1744 06/28/22 2259 06/29/22 0339 06/29/22 1140 06/29/22 2126  HGB 13.0  --   --   --  13.9  --   --   --   --   --  12.9* 12.8*  --   --   HCT 40.0  --   --   --  41.0  --   --   --   --   --  39.9 39.7  --   --   PLT 199  --   --   --   --   --   --   --   --   --  212 201  --   --   APTT  --   --   --  47*  --  47* 51*  --   --   --   --   --   --   --   HEPARINUNFRC  --    < >  --   --   --  0.34  --  0.39  --   --   --   --  0.15* 0.25*  CREATININE 0.96  --   --   --   --  1.44*  --   --  1.36* 1.46* 1.35* 1.33*  --   --   TROPONINIHS 153*  --    < > 591*  --   --   --   --  1,107* 2,350*  --   --   --   --    < > = values in this interval not displayed.     Estimated Creatinine Clearance: 103.2 mL/min (A) (by C-G formula based on SCr of 1.33 mg/dL (H)).   Medical History: Past Medical History:  Diagnosis Date   Chronic HFrEF (heart failure with reduced ejection fraction) (HCC)    Hypertension    Morbid obesity (HCC)    Seizures (HCC)     Assessment: 37 YOM not on anticoagulation PTA who presented with SOB with troponins trending up. Pharmacy consulted to manage heparin for ACS. 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 Heparin drip restarted until MRI this week   Heparin level came back subtherapeutic  again this PM. We will increase rate and check level in AM.  Goal of Therapy:   Monitor platelets by anticoagulation protocol: Yes   Plan:  Increase heparin drip 2400 uts/hr  CBC and heparin levels daily  Monitor  s/sx of bleeding   Ulyses Southward, PharmD, BCIDP, AAHIVP, CPP Infectious Disease Pharmacist 06/29/2022 10:09 PM

## 2022-06-29 NOTE — Progress Notes (Signed)
Pt transported on !00% O2 w/vent settings as per flowsheet to Cath lab w/out incidence.

## 2022-06-29 NOTE — Progress Notes (Signed)
Cardiologist:  [place]  Subjective:  Intubated more alert Spoke with nurse and family   Objective:  Vitals:   06/29/22 0730 06/29/22 0745 06/29/22 0800 06/29/22 0822  BP: 103/70 114/74 130/77   Pulse: 76 81 70   Resp: (!) 23 (!) 21 20   Temp:    99 F (37.2 C)  TempSrc:    Oral  SpO2: 96% 100% 94%   Weight:      Height:        Intake/Output from previous day:  Intake/Output Summary (Last 24 hours) at 06/29/2022 0955 Last data filed at 06/29/2022 0915 Gross per 24 hour  Intake 3268.36 ml  Output 2225 ml  Net 1043.36 ml    Physical Exam:  Obese black male Intubated Decreased BS left base No murmur  Abdomen benign Trace edema CVP 4-5 range   Lab Results: Basic Metabolic Panel: Recent Labs    06/28/22 0342 06/28/22 1418 06/28/22 1744 06/28/22 2259 06/29/22 0339  NA 134*   < > 135  --  138  K 2.7*   < > 3.4*  --  3.3*  CL 95*   < > 98  --  102  CO2 27   < > 26  --  26  GLUCOSE 165*   < > 109*  --  121*  BUN 17   < > 20  --  25*  CREATININE 1.44*   < > 1.46* 1.35* 1.33*  CALCIUM 9.0   < > 9.0  --  8.6*  MG 2.0  --   --   --  1.9  PHOS  --   --   --   --  2.6   < > = values in this interval not displayed.   Liver Function Tests: Recent Labs    06/27/22 2205  AST 44*  ALT 42  ALKPHOS 44  BILITOT 0.7  PROT 7.3  ALBUMIN 3.5   No results for input(s): "LIPASE", "AMYLASE" in the last 72 hours. CBC: Recent Labs    06/27/22 0319 06/28/22 0028 06/28/22 2259 06/29/22 0339  WBC 6.9  --  8.0 7.3  NEUTROABS 4.1  --   --   --   HGB 13.0   < > 12.9* 12.8*  HCT 40.0   < > 39.9 39.7  MCV 86.0  --  85.8 88.0  PLT 199  --  212 201   < > = values in this interval not displayed.    Fasting Lipid Panel: Recent Labs    06/27/22 0919 06/28/22 0342  CHOL 129  --   HDL 42  --   LDLCALC 74  --   TRIG 64 442*  CHOLHDL 3.1  --    Thyroid Function Tests: Recent Labs    06/27/22 0933  TSH 2.244     Imaging: CARDIAC CATHETERIZATION  Result  Date: 06/28/2022   Distal RPDA lesion is 90% stenosed -> thrombotic/embolic   LV end diastolic pressure is normal.   There is no aortic valve stenosis. 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 Essentially normal, large caliber patulous vessels very tortuous with a focal hazy thrombotic (possibly embolic) lesion in the very distal portion of the right PDA, otherwise no notable CAD the tortuosity. There was no culprit lesion unless this was the culprit lesion in the apical PDA.  More consistent however with thromboembolism. Relatively normal LVEDP of 16 mmHg RECOMMENDATIONS Return to CVICU  in stable condition TR band removal per protocol Will run 3 hours of Aggrastat after bolus given in the Cath Lab (continue till 12:30 AM 06/29/2022) Restart IV heparin at 7 AM 06/29/2022 to complete a total of 48 hours IV heparin for MI. Will load with Plavix 300 mg today and start 75 mg tomorrow Continue other care per primary rounding cardiology service and PCCM   DG Abd Portable 1V  Result Date: 06/28/2022 CLINICAL DATA:  OG tube placement. EXAM: PORTABLE ABDOMEN - 1 VIEW COMPARISON:  Earlier today FINDINGS: The tip and side port of the enteric tube are below the diaphragm in the stomach. Unchanged bowel gas pattern with no significant bowel dilatation. IMPRESSION: Tip and side port of the enteric tube below the diaphragm in the stomach. Electronically Signed   By: Narda Rutherford M.D.   On: 06/28/2022 14:58   DG Abd Portable 1V  Result Date: 06/28/2022 CLINICAL DATA:  Evaluate OG tube placement. EXAM: PORTABLE ABDOMEN - 1 VIEW COMPARISON:  06/28/2022, earlier today. FINDINGS: The enteric tube is again noted with tip the in the proximal stomach approximately 7.9 cm below the GE junction. Bowel gas pattern appears nonspecific with no dilated loops of small bowel. IMPRESSION: Enteric tube tip is in the proximal stomach approximately 7.9  cm below the GE junction. Electronically Signed   By: Signa Kell M.D.   On: 06/28/2022 11:50   DG Abd 1 View  Result Date: 06/28/2022 CLINICAL DATA:  OG tube placement. EXAM: ABDOMEN - 1 VIEW COMPARISON:  06/27/2022 FINDINGS: Portal view upper abdomen demonstrates NG tube tip in the mid stomach with proximal side port position below the GE junction. Visualized upper abdomen demonstrates nonspecific gas pattern. IMPRESSION: NG tube tip is in the mid stomach. Electronically Signed   By: Kennith Center M.D.   On: 06/28/2022 09:26   DG Chest Port 1 View  Result Date: 06/28/2022 CLINICAL DATA:  Respiratory failure. EXAM: PORTABLE CHEST 1 VIEW COMPARISON:  06/27/2022 FINDINGS: Endotracheal tube tip is 3.5 cm above the base of the carina. NG tube courses obscured by the overlying cardiac monitoring device. Right PICC line tip overlies the region of the SVC/RA junction. The cardio pericardial silhouette is enlarged. Interval improvement in aeration at the left base with some persistent left base atelectasis or infiltrate. No overt pulmonary edema. No substantial pleural effusion. Telemetry leads overlie the chest. IMPRESSION: Interval improvement in aeration at the left base with some persistent left base atelectasis or infiltrate. Electronically Signed   By: Kennith Center M.D.   On: 06/28/2022 09:24   CT HEAD WO CONTRAST ( )  Result Date: 06/28/2022 CLINICAL DATA:  Altered mental status EXAM: CT HEAD WITHOUT CONTRAST TECHNIQUE: Contiguous axial images were obtained from the base of the skull through the vertex without intravenous contrast. RADIATION DOSE REDUCTION: This exam was performed according to the departmental dose-optimization program which includes automated exposure control, adjustment of the mA and/or kV according to patient size and/or use of iterative reconstruction technique. COMPARISON:  03/13/2012 FINDINGS: Brain: There is no mass, hemorrhage or extra-axial collection. The size and  configuration of the ventricles and extra-axial CSF spaces are normal. There is hypoattenuation of the white matter, most commonly indicating chronic small vessel disease. Old left cerebellar infarct. Vascular: No abnormal hyperdensity of the major intracranial arteries or dural venous sinuses. No intracranial atherosclerosis. Skull: The visualized skull base, calvarium and extracranial soft tissues are normal. Sinuses/Orbits: Bilateral ostiomeatal complex pattern sinus opacification. The orbits are normal. IMPRESSION: 1. No  acute intracranial abnormality. 2. 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 3. Bilateral ostiomeatal complex pattern sinus opacification. Electronically Signed   By: Deatra Robinson M.D.   On: 06/28/2022 01:13   DG CHEST PORT 1 VIEW  Result Date: 06/27/2022 CLINICAL DATA:  Hypoxia EXAM: PORTABLE CHEST 1 VIEW COMPARISON:  Chest x-ray 06/27/2022, 02/17/2019 FINDINGS: Endotracheal tube tip is about 5 cm superior to carina. Cardiomegaly with consolidation at the left lung base and possible effusion. Right upper extremity central venous catheter tip at the SVC. No pneumothorax. IMPRESSION: 1. Endotracheal tube tip about 5 cm superior to carina. 2. Cardiomegaly with consolidation at the left lung base and possible effusion. Electronically Signed   By: Jasmine Pang M.D.   On: 06/27/2022 19:58   DG Abd Portable 1V  Result Date: 06/27/2022 CLINICAL DATA:  Evaluate NG tube placement EXAM: PORTABLE ABDOMEN - 1 VIEW COMPARISON:  None Available. FINDINGS: Bowel gas pattern in the upper abdomen is unremarkable. There is no demonstrable NG tube in the course of thoracic esophagus and stomach. Possibility of NG tube being coiled within pharynx should be considered. IMPRESSION: NG tube is not seen.Possibility of NG tube being coiled within pharynx should be considered. Electronically Signed   By: Ernie Avena M.D.   On: 06/27/2022 18:53   DG Chest Port 1  View  Result Date: 06/27/2022 CLINICAL DATA:  Difficulty breathing EXAM: PORTABLE CHEST 1 VIEW COMPARISON:  Previous studies including the examination done earlier today FINDINGS: Transverse diameter of heart is increased. There is increased opacity in left lung. No focal infiltrates are seen in right lung. There is blunting of left lateral CP angle. Right lateral CP angle is clear. There is no evidence of pneumothorax. Tip of endotracheal tube is 4 cm above the carina. NG tube is not seen. Tip of PICC line is seen in the region of superior vena cava. IMPRESSION: Cardiomegaly. There is decreased aeration in left lung suggesting atelectasis/pneumonia. Possibility of obstruction of central left bronchus by mucous plugs is not excluded. No focal infiltrates are seen in right lung. Tip of endotracheal tube is 4 cm above the carina. NG tube is not seen. Electronically Signed   By: Ernie Avena M.D.   On: 06/27/2022 18:51   DG Abd 1 View  Result Date: 06/27/2022 CLINICAL DATA:  Abdominal pain. EXAM: ABDOMEN - 1 VIEW COMPARISON:  None Available. FINDINGS: Divided views of the abdomen obtained. No bowel dilatation to suggest obstruction. Minimal formed stool in the colon. Calcification in the right pelvis typical of phleboliths. More detailed assessment is limited due to portable technique and body habitus. IMPRESSION: Normal bowel gas pattern. Electronically Signed   By: Narda Rutherford M.D.   On: 06/27/2022 16:44   DG Chest Port 1 View  Result Date: 06/27/2022 CLINICAL DATA:  Shortness of breath. EXAM: PORTABLE CHEST 1 VIEW COMPARISON:  Radiograph earlier today. FINDINGS: There is a new right upper extremity PICC with tip overlying the lower SVC. No pneumothorax. Cardiomegaly is stable. Worsening peribronchial and interstitial thickening likely pulmonary edema. There may be small pleural effusions. No focal airspace disease. IMPRESSION: 1. Tip of the new right upper extremity PICC projects over the lower  SVC. 2. Worsening pulmonary edema. Stable cardiomegaly. Possible small pleural effusions. Electronically Signed   By: Narda Rutherford M.D.   On: 06/27/2022 16:42   [place] EKG SITE RITE  Result Date: 06/27/2022 If Site Rite image not attached, placement could not be confirmed due to current cardiac  rhythm.  ECHOCARDIOGRAM COMPLETE  Result Date: 06/27/2022    ECHOCARDIOGRAM REPORT   Patient Name:   AMI KUMAGAI Date of Exam: 06/27/2022 Medical Rec #:  [id number]   Height:       71.0 in Accession #:    [id number]  Weight:       294.0 lb Date of Birth:  [birth year]   BSA:          2.484 m Patient Age:    37 years    BP:           157/104 mmHg Patient Gender: M           HR:           76 bpm. Exam Location:  Inpatient Procedure: 2D Echo, Cardiac Doppler, Color Doppler and Intracardiac            Opacification Agent Indications:    CHF-Acute Systolic I50.21  History:        Patient has prior history of Echocardiogram examinations, most                 recent 06/12/2021. CHF, Arrythmias:Bradycardia; Risk                 Factors:Hypertension.  Sonographer:    Lucendia Herrlich Referring Phys: [id number] RONDELL A SMITH IMPRESSIONS  1. Left ventricular ejection fraction, by estimation, is 20 to 25%. The left ventricle has severely decreased function. The left ventricle has no regional wall motion abnormalities. The left ventricular internal cavity size was moderately dilated. There  is mild concentric left ventricular hypertrophy. Left ventricular diastolic parameters are consistent with Grade II diastolic dysfunction (pseudonormalization).  2. Right ventricular systolic function is normal. The right ventricular size is normal.  3. Left atrial size was mildly dilated.  4. The mitral valve is normal in structure. Trivial mitral valve regurgitation. No evidence of mitral stenosis.  5. The aortic valve is normal in structure. Aortic valve regurgitation is not visualized. No aortic stenosis is present.  6. The inferior vena cava is  dilated in size with >50% respiratory variability, suggesting right atrial pressure of 8 mmHg. FINDINGS  Left Ventricle: Left ventricular ejection fraction, by estimation, is 20 to 25%. The left ventricle has severely decreased function. The left ventricle has no regional wall motion abnormalities. Definity contrast agent was given IV to delineate the left  ventricular endocardial borders. The left ventricular internal cavity size was moderately dilated. There is mild concentric left ventricular hypertrophy. Left ventricular diastolic parameters are consistent with Grade II diastolic dysfunction (pseudonormalization). Right Ventricle: The right ventricular size is normal. No increase in right ventricular wall thickness. Right ventricular systolic function is normal. Left Atrium: Left atrial size was mildly dilated. Right Atrium: Right atrial size was normal in size. Pericardium: There is no evidence of pericardial effusion. Mitral Valve: The mitral valve is normal in structure. Trivial mitral valve regurgitation. No evidence of mitral valve stenosis. Tricuspid Valve: The tricuspid valve is not well visualized. Tricuspid valve regurgitation is not demonstrated. No evidence of tricuspid stenosis. Aortic Valve: The aortic valve is normal in structure. Aortic valve regurgitation is not visualized. No aortic stenosis is present. Aortic valve peak gradient measures 6.0 mmHg. Pulmonic Valve: The pulmonic valve was not well visualized. Pulmonic valve regurgitation is not visualized. No evidence of pulmonic stenosis. Aorta: The aortic root is normal in size and structure. Venous: The inferior vena cava is dilated in size with greater than 50% respiratory variability, suggesting right atrial pressure of  8 mmHg. IAS/Shunts: No atrial level shunt detected by color flow Doppler.  LEFT VENTRICLE PLAX 2D LVIDd:         7.10 cm   Diastology LVIDs:         5.90 cm   LV e' medial:    4.79 cm/s LV PW:         1.60 cm   LV E/e' medial:   25.9 LV IVS:        1.00 cm   LV e' lateral:   6.76 cm/s LVOT diam:     2.30 cm   LV E/e' lateral: 18.3 LV SV:         92 LV SV Index:   37 LVOT Area:     4.15 cm  RIGHT VENTRICLE             IVC RV S prime:     19.80 cm/s  IVC diam: 2.10 cm TAPSE (M-mode): 3.1 cm LEFT ATRIUM             Index        RIGHT ATRIUM           Index LA diam:        5.60 cm 2.25 cm/m   RA Area:     14.40 cm LA Vol (A2C):   91.2 ml 36.72 ml/m  RA Volume:   33.90 ml  13.65 ml/m LA Vol (A4C):   98.7 ml 39.74 ml/m LA Biplane Vol: 98.1 ml 39.50 ml/m  AORTIC VALVE AV Area (Vmax): 3.88 cm AV Vmax:        122.00 cm/s AV Peak Grad:   6.0 mmHg LVOT Vmax:      114.00 cm/s LVOT Vmean:     75.500 cm/s LVOT VTI:       0.222 m  AORTA Ao Root diam: 3.30 cm Ao Asc diam:  3.30 cm MITRAL VALVE MV Area (PHT): 6.71 cm     SHUNTS MV Decel Time: 113 msec     Systemic VTI:  0.22 m MV E velocity: 124.00 cm/s  Systemic Diam: 2.30 cm MV A velocity: 83.10 cm/s MV E/A ratio:  1.49 Aditya Sabharwal Electronically signed by Dorthula Nettles Signature Date/Time: 06/27/2022/1:13:01 PM    Final     Cardiac Studies:  ECG: SR LVH poor R wave progression    Telemetry:  SR  Echo: EF 20-25% mild LVH RV normal trivial MR  Medications:    arformoterol  15 mcg Nebulization BID   Chlorhexidine Gluconate Cloth  6 each Topical Daily   clopidogrel  75 mg Per Tube Q breakfast   docusate  100 mg Per Tube BID   feeding supplement (PROSource TF20)  60 mL Per Tube TID   pantoprazole (PROTONIX) IV  40 mg Intravenous Daily   polyethylene glycol  17 g Per Tube Daily   revefenacin  175 mcg Nebulization Daily   sodium chloride flush  10-40 mL Intracatheter Q12H   sodium chloride flush  3 mL Intravenous Q12H   sodium chloride flush  3 mL Intravenous Q12H      sodium chloride Stopped (06/28/22 1749)   sodium chloride     cefTRIAXone (ROCEPHIN)  IV Stopped (06/28/22 2322)   feeding supplement (VITAL 1.5 CAL) 30 mL/hr at 06/29/22 0800   fentaNYL infusion  INTRAVENOUS 50 mcg/hr (06/29/22 0800)   heparin 2,000 Units/hr (06/29/22 0800)   levETIRAcetam Stopped (06/29/22 0332)   metronidazole Stopped (06/29/22 0654)   norepinephrine (LEVOPHED) Adult infusion 3 mcg/min (06/29/22  0800)   propofol (DIPRIVAN) infusion 5 mcg/kg/min (06/29/22 0800)    Assessment/Plan:   DCM:  known long standing with poor medical f/u and compliance Drop in EF from 30-35% to 20-25% Yesterday had elevation in troponin and new ECG changes with acute ST elevation Taken to lab and had embolic event to small PDA Rx medically ECG changes resolved. Peak troponin 2350 Had 3 hours of Aggrastat with plavix load and heparin No lasix today with low CVP LVEDP 18 at cath  Pneumonia:  continue rocephin on propofol weaning per CCM AMS:  CT with old cerebellar infarct improved  OSA:  likely contributed to inability to protect airway and need for intubation    Charlton Haws 06/29/2022, 9:55 AM

## 2022-06-29 NOTE — Progress Notes (Signed)
Advanced Heart Failure Rounding Note   Subjective:    Taken to cath lab last night for rising troponin -> probable thrombotic occlusion of very distal R PDA. Otherwise normal coronaries. Treated with aggrastat. Now on heparin.   Extubated this am. Denies CP or SOB. CVP 5.  On NE 4. Co-ox 72%   Objective:   Weight Range:  Vital Signs:   Temp:  [98 F (36.7 C)-100 F (37.8 C)] 98 F (36.7 C) (05/26 1136) Pulse Rate:  [52-96] 82 (05/26 1245) Resp:  [14-29] 20 (05/26 1045) BP: (81-131)/(52-91) 110/77 (05/26 1245) SpO2:  [89 %-100 %] 89 % (05/26 1245) FiO2 (%):  [40 %-44 %] 44 % (05/26 1043) Weight:  [131.6 kg] 131.6 kg (05/26 0615) Last BM Date : 06/29/22  Weight change: Filed Weights   06/27/22 0715 06/28/22 0500 06/29/22 0615  Weight: 133.4 kg 130.6 kg 131.6 kg    Intake/Output:   Intake/Output Summary (Last 24 hours) at 06/29/2022 1316 Last data filed at 06/29/2022 1300 Gross per 24 hour  Intake 3033.46 ml  Output 1075 ml  Net 1958.46 ml     Physical Exam: General:  Sitting up in bed No resp difficulty HEENT: normal Neck: supple. no JVD. Carotids 2+ bilat; no bruits. No lymphadenopathy or thryomegaly appreciated. Cor: Regular rate & rhythm. No rubs, gallops or murmurs. Lungs: clear Abdomen: obese soft, nontender, nondistended. No hepatosplenomegaly. No bruits or masses. Good bowel sounds. Extremities: no cyanosis, clubbing, rash, edema Neuro: alert & orientedx3, cranial nerves grossly intact. moves all 4 extremities w/o difficulty. Affect pleasant   Telemetry: NSR 80-90s Personally reviewed  Labs: Basic Metabolic Panel: Recent Labs  Lab 06/27/22 0319 06/27/22 0933 06/27/22 2205 06/28/22 0028 06/28/22 0342 06/28/22 1418 06/28/22 1744 06/28/22 2259 06/29/22 0339  NA 139  --   --  139 134* 136 135  --  138  K 3.9  --   --  3.0* 2.7* 4.0 3.4*  --  3.3*  CL 103  --   --   --  95* 97* 98  --  102  CO2 29  --   --   --  27 28 26   --  26  GLUCOSE  101*  --   --   --  165* 111* 109*  --  121*  BUN 14  --   --   --  17 18 20   --  25*  CREATININE 0.96  --   --   --  1.44* 1.36* 1.46* 1.35* 1.33*  CALCIUM 9.5  --   --   --  9.0 9.1 9.0  --  8.6*  MG  --  1.4* 2.4  --  2.0  --   --   --  1.9  PHOS  --   --   --   --   --   --   --   --  2.6    Liver Function Tests: Recent Labs  Lab 06/27/22 2205  AST 44*  ALT 42  ALKPHOS 44  BILITOT 0.7  PROT 7.3  ALBUMIN 3.5   No results for input(s): "LIPASE", "AMYLASE" in the last 168 hours. Recent Labs  Lab 06/27/22 1418  AMMONIA 33    CBC: Recent Labs  Lab 06/27/22 0319 06/28/22 0028 06/28/22 2259 06/29/22 0339  WBC 6.9  --  8.0 7.3  NEUTROABS 4.1  --   --   --   HGB 13.0 13.9 12.9* 12.8*  HCT 40.0 41.0 39.9 39.7  MCV 86.0  --  85.8 88.0  PLT 199  --  212 201    Cardiac Enzymes: No results for input(s): "CKTOTAL", "CKMB", "CKMBINDEX", "TROPONINI" in the last 168 hours.  BNP: BNP (last 3 results) Recent Labs    06/27/22 0319  BNP 1,071.2*    ProBNP (last 3 results) No results for input(s): "PROBNP" in the last 8760 hours.    Other results:  Imaging: CARDIAC CATHETERIZATION  Result Date: 06/28/2022   Distal RPDA lesion is 90% stenosed -> thrombotic/embolic   LV end diastolic pressure is normal.   There is no aortic valve stenosis. 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 Essentially normal, large caliber patulous vessels very tortuous with a focal hazy thrombotic (possibly embolic) lesion in the very distal portion of the right PDA, otherwise no notable CAD the tortuosity. There was no culprit lesion unless this was the culprit lesion in the apical PDA.  More consistent however with thromboembolism. Relatively normal LVEDP of 16 mmHg RECOMMENDATIONS Return to CVICU in stable condition TR band removal per protocol Will run 3 hours of Aggrastat after bolus given in the Cath Lab  (continue till 12:30 AM 06/29/2022) Restart IV heparin at 7 AM 06/29/2022 to complete a total of 48 hours IV heparin for MI. Will load with Plavix 300 mg today and start 75 mg tomorrow Continue other care per primary rounding cardiology service and PCCM   DG Abd Portable 1V  Result Date: 06/28/2022 CLINICAL DATA:  OG tube placement. EXAM: PORTABLE ABDOMEN - 1 VIEW COMPARISON:  Earlier today FINDINGS: The tip and side port of the enteric tube are below the diaphragm in the stomach. Unchanged bowel gas pattern with no significant bowel dilatation. IMPRESSION: Tip and side port of the enteric tube below the diaphragm in the stomach. Electronically Signed   By: Narda Rutherford M.D.   On: 06/28/2022 14:58   DG Abd Portable 1V  Result Date: 06/28/2022 CLINICAL DATA:  Evaluate OG tube placement. EXAM: PORTABLE ABDOMEN - 1 VIEW COMPARISON:  06/28/2022, earlier today. FINDINGS: The enteric tube is again noted with tip the in the proximal stomach approximately 7.9 cm below the GE junction. Bowel gas pattern appears nonspecific with no dilated loops of small bowel. IMPRESSION: Enteric tube tip is in the proximal stomach approximately 7.9 cm below the GE junction. Electronically Signed   By: Signa Kell M.D.   On: 06/28/2022 11:50   DG Abd 1 View  Result Date: 06/28/2022 CLINICAL DATA:  OG tube placement. EXAM: ABDOMEN - 1 VIEW COMPARISON:  06/27/2022 FINDINGS: Portal view upper abdomen demonstrates NG tube tip in the mid stomach with proximal side port position below the GE junction. Visualized upper abdomen demonstrates nonspecific gas pattern. IMPRESSION: NG tube tip is in the mid stomach. Electronically Signed   By: Kennith Center M.D.   On: 06/28/2022 09:26   DG Chest Port 1 View  Result Date: 06/28/2022 CLINICAL DATA:  Respiratory failure. EXAM: PORTABLE CHEST 1 VIEW COMPARISON:  06/27/2022 FINDINGS: Endotracheal tube tip is 3.5 cm above the base of the carina. NG tube courses obscured by the overlying  cardiac monitoring device. Right PICC line tip overlies the region of the SVC/RA junction. The cardio pericardial silhouette is enlarged. Interval improvement in aeration at the left base with some persistent left base atelectasis or infiltrate. No overt pulmonary edema. No substantial pleural effusion. Telemetry leads overlie the chest. IMPRESSION: Interval improvement in  aeration at the left base with some persistent left base atelectasis or infiltrate. Electronically Signed   By: Kennith Center M.D.   On: 06/28/2022 09:24   CT HEAD WO CONTRAST ( )  Result Date: 06/28/2022 CLINICAL DATA:  Altered mental status EXAM: CT HEAD WITHOUT CONTRAST TECHNIQUE: Contiguous axial images were obtained from the base of the skull through the vertex without intravenous contrast. RADIATION DOSE REDUCTION: This exam was performed according to the departmental dose-optimization program which includes automated exposure control, adjustment of the mA and/or kV according to patient size and/or use of iterative reconstruction technique. COMPARISON:  03/13/2012 FINDINGS: Brain: There is no mass, hemorrhage or extra-axial collection. The size and configuration of the ventricles and extra-axial CSF spaces are normal. There is hypoattenuation of the white matter, most commonly indicating chronic small vessel disease. Old left cerebellar infarct. Vascular: No abnormal hyperdensity of the major intracranial arteries or dural venous sinuses. No intracranial atherosclerosis. Skull: The visualized skull base, calvarium and extracranial soft tissues are normal. Sinuses/Orbits: Bilateral ostiomeatal complex pattern sinus opacification. The orbits are normal. IMPRESSION: 1. No acute intracranial abnormality. 2. 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 3. Bilateral ostiomeatal complex pattern sinus opacification. Electronically Signed   By: Deatra Robinson M.D.   On: 06/28/2022 01:13   DG CHEST PORT 1  VIEW  Result Date: 06/27/2022 CLINICAL DATA:  Hypoxia EXAM: PORTABLE CHEST 1 VIEW COMPARISON:  Chest x-ray 06/27/2022, 02/17/2019 FINDINGS: Endotracheal tube tip is about 5 cm superior to carina. Cardiomegaly with consolidation at the left lung base and possible effusion. Right upper extremity central venous catheter tip at the SVC. No pneumothorax. IMPRESSION: 1. Endotracheal tube tip about 5 cm superior to carina. 2. Cardiomegaly with consolidation at the left lung base and possible effusion. Electronically Signed   By: Jasmine Pang M.D.   On: 06/27/2022 19:58   DG Abd Portable 1V  Result Date: 06/27/2022 CLINICAL DATA:  Evaluate NG tube placement EXAM: PORTABLE ABDOMEN - 1 VIEW COMPARISON:  None Available. FINDINGS: Bowel gas pattern in the upper abdomen is unremarkable. There is no demonstrable NG tube in the course of thoracic esophagus and stomach. Possibility of NG tube being coiled within pharynx should be considered. IMPRESSION: NG tube is not seen.Possibility of NG tube being coiled within pharynx should be considered. Electronically Signed   By: Ernie Avena M.D.   On: 06/27/2022 18:53   DG Chest Port 1 View  Result Date: 06/27/2022 CLINICAL DATA:  Difficulty breathing EXAM: PORTABLE CHEST 1 VIEW COMPARISON:  Previous studies including the examination done earlier today FINDINGS: Transverse diameter of heart is increased. There is increased opacity in left lung. No focal infiltrates are seen in right lung. There is blunting of left lateral CP angle. Right lateral CP angle is clear. There is no evidence of pneumothorax. Tip of endotracheal tube is 4 cm above the carina. NG tube is not seen. Tip of PICC line is seen in the region of superior vena cava. IMPRESSION: Cardiomegaly. There is decreased aeration in left lung suggesting atelectasis/pneumonia. Possibility of obstruction of central left bronchus by mucous plugs is not excluded. No focal infiltrates are seen in right lung. Tip of  endotracheal tube is 4 cm above the carina. NG tube is not seen. Electronically Signed   By: Ernie Avena M.D.   On: 06/27/2022 18:51   DG Abd 1 View  Result Date: 06/27/2022 CLINICAL DATA:  Abdominal pain. EXAM: ABDOMEN - 1 VIEW COMPARISON:  None Available.  FINDINGS: Divided views of the abdomen obtained. No bowel dilatation to suggest obstruction. Minimal formed stool in the colon. Calcification in the right pelvis typical of phleboliths. More detailed assessment is limited due to portable technique and body habitus. IMPRESSION: Normal bowel gas pattern. Electronically Signed   By: Narda Rutherford M.D.   On: 06/27/2022 16:44   DG Chest Port 1 View  Result Date: 06/27/2022 CLINICAL DATA:  Shortness of breath. EXAM: PORTABLE CHEST 1 VIEW COMPARISON:  Radiograph earlier today. FINDINGS: There is a new right upper extremity PICC with tip overlying the lower SVC. No pneumothorax. Cardiomegaly is stable. Worsening peribronchial and interstitial thickening likely pulmonary edema. There may be small pleural effusions. No focal airspace disease. IMPRESSION: 1. Tip of the new right upper extremity PICC projects over the lower SVC. 2. Worsening pulmonary edema. Stable cardiomegaly. Possible small pleural effusions. Electronically Signed   By: Narda Rutherford M.D.   On: 06/27/2022 16:42   [place] EKG SITE RITE  Result Date: 06/27/2022 If Site Rite image not attached, placement could not be confirmed due to current cardiac rhythm.    Medications:     Scheduled Medications:  arformoterol  15 mcg Nebulization BID   Chlorhexidine Gluconate Cloth  6 each Topical Daily   clopidogrel  75 mg Per Tube Q breakfast   docusate  100 mg Per Tube BID   pantoprazole (PROTONIX) IV  40 mg Intravenous Daily   polyethylene glycol  17 g Per Tube Daily   revefenacin  175 mcg Nebulization Daily   sodium chloride flush  10-40 mL Intracatheter Q12H   sodium chloride flush  3 mL Intravenous Q12H   sodium chloride flush   3 mL Intravenous Q12H    Infusions:  sodium chloride Stopped (06/28/22 1749)   sodium chloride     cefTRIAXone (ROCEPHIN)  IV Stopped (06/28/22 2322)   heparin 2,000 Units/hr (06/29/22 1200)   levETIRAcetam Stopped (06/29/22 0332)   metronidazole Stopped (06/29/22 0654)   norepinephrine (LEVOPHED) Adult infusion Stopped (06/29/22 1144)    PRN Medications: sodium chloride, sodium chloride, acetaminophen **OR** acetaminophen, glycopyrrolate, hydrALAZINE, ipratropium-albuterol, labetalol, midazolam, ondansetron (ZOFRAN) IV, mouth rinse, prochlorperazine, sodium chloride flush, sodium chloride flush, sodium chloride flush   Assessment/Plan:   1. Acute on chronic systolic HF - onset  2021  - Last echo 06/2021 showed EF 30-35% thought to be HTN - Echo this admit EF 20-25% - On NE 5 Co-ox 72% Wean NE as tolerated - CVP 5 - hold lasix - start GDMT as BP tolerates - will plan cMRI on Tuesday   2. Fluctuating ST elevation on ECG -> NSTEMI - did not meet STEMI criteria - hsTrop 153 -> 219 -> 302 -> 330 -> 591 -> 1,107 -> 2,350 - cath on 5/25 thrombotic subtotal occlusion of apical PDA. Otherwise normal - will need AC - Plan cMRI to look for LV thrombus - Plan statin/Plavix/DOAC   3. A/c hypoxic hypoxic respiratory failure - likely due to HF/PNA - continue abx per CCM   4. Encephalopathy - resolved - CCM managing   5. AKI - likely volume depletion - holding diuretics - scr impoved   6. Hypokalemia/hypomag - supp  - add spiro tomorrow  CRITICAL CARE Performed by: Arvilla Meres  Total critical care time: 35 minutes  Critical care time was exclusive of separately billable procedures and treating other patients.  Critical care was necessary to treat or prevent imminent or life-threatening deterioration.  Critical care was time spent personally by me (independent  of midlevel providers or residents) on the following activities: development of treatment plan with  patient and/or surrogate as well as nursing, discussions with consultants, evaluation of patient's response to treatment, examination of patient, obtaining history from patient or surrogate, ordering and performing treatments and interventions, ordering and review of laboratory studies, ordering and review of radiographic studies, pulse oximetry and re-evaluation of patient's condition.   Length of Stay: 2   Arvilla Meres MD 06/29/2022, 1:16 PM  Advanced Heart Failure Team Pager [phone] (M-F; 7a - 4p)  Please contact [facility] for night-coverage after hours (4p -7a ) and weekends on [email or web address]

## 2022-06-30 ENCOUNTER — Inpatient Hospital Stay (HOSPITAL_BASED_OUTPATIENT_CLINIC_OR_DEPARTMENT_OTHER): Payer: Medicaid Other

## 2022-06-30 DIAGNOSIS — I517 Cardiomegaly: Secondary | ICD-10-CM

## 2022-06-30 DIAGNOSIS — I2111 ST elevation (STEMI) myocardial infarction involving right coronary artery: Secondary | ICD-10-CM

## 2022-06-30 DIAGNOSIS — I5023 Acute on chronic systolic (congestive) heart failure: Secondary | ICD-10-CM | POA: Diagnosis not present

## 2022-06-30 LAB — CBC
HCT: 39.3 % (ref 39.0–52.0)
Hemoglobin: 12.2 g/dL — ABNORMAL LOW (ref 13.0–17.0)
MCH: 27.5 pg (ref 26.0–34.0)
MCHC: 31 g/dL (ref 30.0–36.0)
MCV: 88.5 fL (ref 80.0–100.0)
Platelets: 174 10*3/uL (ref 150–400)
RBC: 4.44 MIL/uL (ref 4.22–5.81)
RDW: 13.5 % (ref 11.5–15.5)
WBC: 9.9 10*3/uL (ref 4.0–10.5)
nRBC: 0 % (ref 0.0–0.2)

## 2022-06-30 LAB — COOXEMETRY PANEL
Carboxyhemoglobin: 1.9 % — ABNORMAL HIGH (ref 0.5–1.5)
Methemoglobin: <0.7 % (ref 0.0–1.5)
O2 Saturation: 81.5 %
Total hemoglobin: 12.5 g/dL (ref 12.0–16.0)

## 2022-06-30 LAB — GLUCOSE, CAPILLARY
Glucose-Capillary: 104 mg/dL — ABNORMAL HIGH (ref 70–99)
Glucose-Capillary: 111 mg/dL — ABNORMAL HIGH (ref 70–99)
Glucose-Capillary: 119 mg/dL — ABNORMAL HIGH (ref 70–99)
Glucose-Capillary: 83 mg/dL (ref 70–99)
Glucose-Capillary: 84 mg/dL (ref 70–99)
Glucose-Capillary: 95 mg/dL (ref 70–99)

## 2022-06-30 LAB — BASIC METABOLIC PANEL
Anion gap: 5 (ref 5–15)
BUN: 13 mg/dL (ref 6–20)
CO2: 26 mmol/L (ref 22–32)
Calcium: 8.3 mg/dL — ABNORMAL LOW (ref 8.9–10.3)
Chloride: 105 mmol/L (ref 98–111)
Creatinine, Ser: 0.88 mg/dL (ref 0.61–1.24)
GFR, Estimated: >60 mL/min (ref >60)
Glucose, Bld: 102 mg/dL — ABNORMAL HIGH (ref 70–99)
Potassium: 4.1 mmol/L (ref 3.5–5.1)
Sodium: 136 mmol/L (ref 135–145)

## 2022-06-30 LAB — MAGNESIUM: Magnesium: 1.7 mg/dL (ref 1.7–2.4)

## 2022-06-30 LAB — HEPARIN LEVEL (UNFRACTIONATED): Heparin Unfractionated: 0.44 IU/mL (ref 0.30–0.70)

## 2022-06-30 LAB — PHOSPHORUS: Phosphorus: 3.2 mg/dL (ref 2.5–4.6)

## 2022-06-30 MED ORDER — SACUBITRIL-VALSARTAN 24-26 MG PO TABS
1.0000 | ORAL_TABLET | Freq: Two times a day (BID) | ORAL | Status: DC
  Administered 2022-06-30 – 2022-07-01 (×3): 1 via ORAL
  Filled 2022-06-30 (×4): qty 1

## 2022-06-30 MED ORDER — MAGNESIUM SULFATE 2 GM/50ML IV SOLN
2.0000 g | Freq: Once | INTRAVENOUS | Status: AC
  Administered 2022-06-30: 2 g via INTRAVENOUS
  Filled 2022-06-30: qty 50

## 2022-06-30 MED ORDER — GADOBUTROL 1 MMOL/ML IV SOLN
15.0000 mL | Freq: Once | INTRAVENOUS | Status: AC | PRN
  Administered 2022-06-30: 15 mL via INTRAVENOUS

## 2022-06-30 MED ORDER — EMPAGLIFLOZIN 10 MG PO TABS
10.0000 mg | ORAL_TABLET | Freq: Every day | ORAL | Status: DC
  Administered 2022-06-30 – 2022-07-01 (×2): 10 mg via ORAL
  Filled 2022-06-30 (×2): qty 1

## 2022-06-30 MED ORDER — CARVEDILOL 6.25 MG PO TABS
6.2500 mg | ORAL_TABLET | Freq: Two times a day (BID) | ORAL | Status: DC
  Administered 2022-06-30 – 2022-07-01 (×3): 6.25 mg via ORAL
  Filled 2022-06-30 (×3): qty 1

## 2022-06-30 MED ORDER — APIXABAN 5 MG PO TABS
5.0000 mg | ORAL_TABLET | Freq: Two times a day (BID) | ORAL | Status: DC
  Administered 2022-06-30 – 2022-07-01 (×3): 5 mg via ORAL
  Filled 2022-06-30 (×3): qty 1

## 2022-06-30 NOTE — Progress Notes (Signed)
Rounding Note    Patient Name: Mark Garcia Date of Encounter: 06/30/2022  [facility] Cardiologist: Nayely Dingus [place], MD   Subjective   Feels well this am. No chest pain or dyspnea. States he had been out of his medication for several months but had recently resumed.   Inpatient Medications    Scheduled Meds:  arformoterol  15 mcg Nebulization BID   atorvastatin  40 mg Oral Daily   Chlorhexidine Gluconate Cloth  6 each Topical Daily   clopidogrel  75 mg Per Tube Q breakfast   docusate  100 mg Per Tube BID   polyethylene glycol  17 g Per Tube Daily   revefenacin  175 mcg Nebulization Daily   sodium chloride flush  10-40 mL Intracatheter Q12H   sodium chloride flush  3 mL Intravenous Q12H   sodium chloride flush  3 mL Intravenous Q12H   Continuous Infusions:  sodium chloride Stopped (06/29/22 1713)   sodium chloride     cefTRIAXone (ROCEPHIN)  IV Stopped (06/29/22 2217)   heparin 2,400 Units/hr (06/30/22 0445)   levETIRAcetam Stopped (06/30/22 0255)   magnesium sulfate bolus IVPB     metronidazole 500 mg (06/30/22 0619)   norepinephrine (LEVOPHED) Adult infusion Stopped (06/29/22 1144)   PRN Meds: sodium chloride, sodium chloride, acetaminophen **OR** acetaminophen, hydrALAZINE, ipratropium-albuterol, labetalol, ondansetron (ZOFRAN) IV, mouth rinse, prochlorperazine, sodium chloride flush, sodium chloride flush, sodium chloride flush   Vital Signs    Vitals:   06/30/22 0100 06/30/22 0200 06/30/22 0300 06/30/22 0400  BP: 112/83 122/86 128/79 130/78  Pulse: 88 89 87 84  Resp: (!) 29 19 (!) 39 (!) 29  Temp:      TempSrc:      SpO2: 92% 94% 91% 97%  Weight:      Height:        Intake/Output Summary (Last 24 hours) at 06/30/2022 0730 Last data filed at 06/30/2022 1610 Gross per 24 hour  Intake 2593.92 ml  Output 1175 ml  Net 1418.92 ml      06/29/2022    6:15 AM 06/28/2022    5:00 AM 06/27/2022    7:15 AM  Last 3 Weights  Weight (lbs) 290 lb 2 oz 287 lb  14.7 oz 294 lb  Weight (kg) 131.6 kg 130.6 kg 133.358 kg      Telemetry    NSR - Personally Reviewed  ECG    NSR with LVH and repolarization abnormality. - Personally Reviewed  Physical Exam   GEN: No acute distress.   Neck: No JVD Cardiac: RRR, no murmurs, rubs, or gallops.  Respiratory: Clear to auscultation bilaterally. GI: Soft, nontender, non-distended  MS: No edema; No deformity. Neuro:  Nonfocal  Psych: Normal affect   Labs    High Sensitivity Troponin:   Recent Labs  Lab 06/27/22 0919 06/27/22 1112 06/27/22 1800 06/28/22 1418 06/28/22 1744  TROPONINIHS 302* 330* 591* 1,107* 2,350*     Chemistry Recent Labs  Lab 06/27/22 2205 06/28/22 0028 06/28/22 0342 06/28/22 1418 06/28/22 1744 06/28/22 2259 06/29/22 0339 06/30/22 0421  NA  --    < > 134*   < > 135  --  138 136  K  --    < > 2.7*   < > 3.4*  --  3.3* 4.1  CL  --   --  95*   < > 98  --  102 105  CO2  --   --  27   < > 26  --  26  26  GLUCOSE  --   --  165*   < > 109*  --  121* 102*  BUN  --   --  17   < > 20  --  25* 13  CREATININE  --   --  1.44*   < > 1.46* 1.35* 1.33* 0.88  CALCIUM  --   --  9.0   < > 9.0  --  8.6* 8.3*  MG 2.4  --  2.0  --   --   --  1.9 1.7  PROT 7.3  --   --   --   --   --   --   --   ALBUMIN 3.5  --   --   --   --   --   --   --   AST 44*  --   --   --   --   --   --   --   ALT 42  --   --   --   --   --   --   --   ALKPHOS 44  --   --   --   --   --   --   --   BILITOT 0.7  --   --   --   --   --   --   --   GFRNONAA  --   --  >60   < > >60 >60 >60 >60  ANIONGAP  --   --  12   < > 11  --  10 5   < > = values in this interval not displayed.    Lipids  Recent Labs  Lab 06/27/22 0919 06/28/22 0342  CHOL 129  --   TRIG 64 442*  HDL 42  --   LDLCALC 74  --   CHOLHDL 3.1  --     Hematology Recent Labs  Lab 06/28/22 2259 06/29/22 0339 06/30/22 0421  WBC 8.0 7.3 9.9  RBC 4.65 4.51 4.44  HGB 12.9* 12.8* 12.2*  HCT 39.9 39.7 39.3  MCV 85.8 88.0 88.5  MCH  27.7 28.4 27.5  MCHC 32.3 32.2 31.0  RDW 13.3 13.3 13.5  PLT 212 201 174   Thyroid  Recent Labs  Lab 06/27/22 0933  TSH 2.244    BNP Recent Labs  Lab 06/27/22 0319  BNP 1,071.2*    DDimer No results for input(s): "DDIMER" in the last 168 hours.   Radiology    CARDIAC CATHETERIZATION  Result Date: 06/28/2022   Distal RPDA lesion is 90% stenosed -> thrombotic/embolic   LV end diastolic pressure is normal.   There is no aortic valve stenosis. 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 Essentially normal, large caliber patulous vessels very tortuous with a focal hazy thrombotic (possibly embolic) lesion in the very distal portion of the right PDA, otherwise no notable CAD the tortuosity. There was no culprit lesion unless this was the culprit lesion in the apical PDA.  More consistent however with thromboembolism. Relatively normal LVEDP of 16 mmHg RECOMMENDATIONS Return to CVICU in stable condition TR band removal per protocol Will run 3 hours of Aggrastat after bolus given in the Cath Lab (continue till 12:30 AM 06/29/2022) Restart IV heparin at 7 AM 06/29/2022 to complete a total of 48 hours IV heparin for MI. Will load with Plavix 300 mg today and start 75 mg  tomorrow Continue other care per primary rounding cardiology service and PCCM   DG Abd Portable 1V  Result Date: 06/28/2022 CLINICAL DATA:  OG tube placement. EXAM: PORTABLE ABDOMEN - 1 VIEW COMPARISON:  Earlier today FINDINGS: The tip and side port of the enteric tube are below the diaphragm in the stomach. Unchanged bowel gas pattern with no significant bowel dilatation. IMPRESSION: Tip and side port of the enteric tube below the diaphragm in the stomach. Electronically Signed   By: Narda Rutherford M.D.   On: 06/28/2022 14:58   DG Abd Portable 1V  Result Date: 06/28/2022 CLINICAL DATA:  Evaluate OG tube placement. EXAM: PORTABLE ABDOMEN - 1  VIEW COMPARISON:  06/28/2022, earlier today. FINDINGS: The enteric tube is again noted with tip the in the proximal stomach approximately 7.9 cm below the GE junction. Bowel gas pattern appears nonspecific with no dilated loops of small bowel. IMPRESSION: Enteric tube tip is in the proximal stomach approximately 7.9 cm below the GE junction. Electronically Signed   By: Signa Kell M.D.   On: 06/28/2022 11:50    Cardiac Studies   Echo 06/27/22: IMPRESSIONS     1. Left ventricular ejection fraction, by estimation, is 20 to 25%. The  left ventricle has severely decreased function. The left ventricle has no  regional wall motion abnormalities. The left ventricular internal cavity  size was moderately dilated. There   is mild concentric left ventricular hypertrophy. Left ventricular  diastolic parameters are consistent with Grade II diastolic dysfunction  (pseudonormalization).   2. Right ventricular systolic function is normal. The right ventricular  size is normal.   3. Left atrial size was mildly dilated.   4. The mitral valve is normal in structure. Trivial mitral valve  regurgitation. No evidence of mitral stenosis.   5. The aortic valve is normal in structure. Aortic valve regurgitation is  not visualized. No aortic stenosis is present.   6. The inferior vena cava is dilated in size with >50% respiratory  variability, suggesting right atrial pressure of 8 mmHg.   Cardiac cath 06/28/22:  LEFT HEART CATH AND CORONARY ANGIOGRAPHY   Conclusion      Distal RPDA lesion is 90% stenosed -> thrombotic/embolic   LV end diastolic pressure is normal.   There is no aortic valve stenosis.   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 Essentially normal, large caliber patulous vessels very tortuous with a focal hazy thrombotic (possibly embolic) lesion in the very distal portion of the right PDA, otherwise no  notable CAD the tortuosity. There was no culprit lesion unless this was the culprit lesion in the apical PDA.  More consistent however with thromboembolism. Relatively normal LVEDP of 16 mmHg      RECOMMENDATIONS Return to CVICU in stable condition TR band removal per protocol Will run 3 hours of Aggrastat after bolus given in the Cath Lab (continue till 12:30 AM 06/29/2022) Restart IV heparin at 7 AM 06/29/2022 to complete a total of 48 hours IV heparin for MI. Will load with Plavix 300 mg today and start 75 mg tomorrow Continue other care per primary rounding cardiology service and PCCM    Coronary Diagrams  Diagnostic Dominance: Right  Intervention     37 y.o. male with history of severe HTN, hypertensive cardiomyopathy presents with acute respiratory failure/CHF. On 5/25 had acute STEMI with ST elevation inferiorly  Assessment & Plan    1.Acute on chronic systolic  CHF  -diagnosed in 2021.  - last Echo in May 2023 showed EF 30-35% on good medical therapy - Echo this admit with EF 20-25%. Recent noncompliance with medical therapy - now off pressors, lungs clear - plan to resume GDMT. Will resume Entresto 24/26 mg bid. Coreg 6.25 mg bid. Add aldactone as tolerated. Also resume Jardiance.  - plan MRI tomorrow. - co-ox 81 today  2. Acute inferior STEMI. Cardiac cath demonstrated thrombus in very distal PDA. No other significant CAD. Suspect embolic  - plan MRI tomorrow.  - plan statin/plavix/DOAC. - can initiate Eliquis today and stop IV heparin  3. Acute hypoxic respiratory failure  - likely due to CHF exacerbation.  - on antibiotics per CCM for possible PNA  4. Encephalopathy- resolved  5. AKI.  - renal function back to baseline.  - can start Entresto today and monitor.  - hold diuretics for now.       For questions or updates, please contact [facility] Please consult [email or web address] for contact info under        Signed, Idy Rawling [place], MD  06/30/2022,  7:30 AM

## 2022-06-30 NOTE — Progress Notes (Signed)
NAME:  Mark Garcia, MRN:  [id number], DOB:  July 14, 1985, LOS: 3 ADMISSION DATE:  06/27/2022, CONSULTATION DATE:  06/27/2022 REFERRING MD:  Dr. Katrinka Blazing, [facility], CHIEF COMPLAINT:  AMS   History of Present Illness:  37 yo male presented to ER with dyspnea and leg swelling.  Hx of HFrEF, HTN, Seizures but wasn't able to take medications consistently--has been out of medications for months.  Had elevated troponin.  Started on lasix and cardiology consulted.  Hd nausea with vomiting.  Developed lethargy, which by report from his wife had been going on for several days prior to admission but worsening.  VBG showed hypercapnia.  PCCM consulted and transferred to ICU.  Pertinent  Medical History  HTN, HFrEF, Seizures  Significant Hospital Events: Including procedures, antibiotic start and stop dates in addition to other pertinent events   5/24 Admit, cardiology consult, transfer to ICU and start Bipap.  Required intubation for progressive encephalopathy. 5/25 EKG changes, rising trop> LHC, no stents 5/26 extubated  Interim History / Subjective:  Mild sore throat- improving today. Denies other complaints.   Objective   Blood pressure 101/61, pulse 79, temperature 98.1 F (36.7 C), temperature source Oral, resp. rate (!) 29, height 5\' 11"  (1.803 m), weight 131.6 kg, SpO2 97 %. CVP:  [3 mmHg-12 mmHg] 4 mmHg      Intake/Output Summary (Last 24 hours) at 06/30/2022 1205 Last data filed at 06/30/2022 1100 Gross per 24 hour  Intake 2439.16 ml  Output 1475 ml  Net 964.16 ml    Filed Weights   06/27/22 0715 06/28/22 0500 06/29/22 0615  Weight: 133.4 kg 130.6 kg 131.6 kg    Examination:  General -healthy appearing man sitting up in the recliner in NAD HENT - [place]/AT, eyes anicteric Cardiac - S1S2, RRR Chest -  breathing comfortably on [place], no rhales Abdomen - soft, NT Extremities -no cyanosis or edema Skin - warm, dry, no rashes Neuro -  awake, alert, moving all extremities, answering questions  appropriately.   LHC: Distal RPDA lesion is 90% stenosed -> thrombotic/embolic   LV end diastolic pressure is normal.   There is no aortic valve stenosis.   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 Essentially normal, large caliber patulous vessels very tortuous with a focal hazy thrombotic (possibly embolic) lesion in the very distal portion of the right PDA, otherwise no notable CAD the tortuosity. There was no culprit lesion unless this was the culprit lesion in the apical PDA.  More consistent however with thromboembolism. Relatively normal LVEDP of 16 mmHg   Na+ 136 K+ 4.1 BUN 13 Cr 0.88 Respiratory culture-no WBC, rare GPC, GNR> normal flora Blood culture- NGTD  Resolved Hospital Problem list     Assessment & Plan:   Acute metabolic encephalopathy mostly likely related to hypercapnia in setting of sleep disordered breathing.  Wonder if antiemetics had an excessive sedating effect on him-- resolved Hx of seizures. -avoid sedating meds -con't keppra -needs OP PSG  Acute on chronic hypoxic & hypercapnic respiratory failure with presumed obstructive sleep apnea and obesity hypoventilation syndrome, with acute pulmonary edema. Left aspiration pneumonia Possible asthma -needs PSG as OP to evaluate for OSA  -wean O2 as able to maintain SpO2 >90% -7 days of antibiotics for pneumonia  Acute on chronic HFrEF> NICM EKG changes due to NSTEMI Hx of HTN -Appreciate Cardiology & AHF's management  -cardiac MRI ordered today -plavix, starting apixaban -start coreg, Entresto -  consider starting spironolactone & farxiga tomorrow -needs follow up with heart failure clinic -does not have an AICD  Hypomagnesemia, resolved -repleted, montor  Hypokalemia, resolved - monitor  AKI-resolved -renally dose meds, avoid nephrotoxic meds, strict I/O  History of epilepsy -con't keppra   Stable  to transfer out of ICU today.  TRH to re-assume care tomorrow.   Best Practice (right click and "Reselect all SmartList Selections" daily)   Diet/type: Regular consistency (see orders) DVT prophylaxis: DOAC GI prophylaxis: PPI Lines: Central line- picc Foley:  Yes, and it is still needed Code Status:  full code Last date of multidisciplinary goals of care discussion [updated his wife at bedside 6/26]  Labs   CBC: Recent Labs  Lab 06/27/22 0319 06/28/22 0028 06/28/22 2259 06/29/22 0339 06/30/22 0421  WBC 6.9  --  8.0 7.3 9.9  NEUTROABS 4.1  --   --   --   --   HGB 13.0 13.9 12.9* 12.8* 12.2*  HCT 40.0 41.0 39.9 39.7 39.3  MCV 86.0  --  85.8 88.0 88.5  PLT 199  --  212 201 174     Basic Metabolic Panel: Recent Labs  Lab 06/27/22 0933 06/27/22 2205 06/28/22 0028 06/28/22 0342 06/28/22 1418 06/28/22 1744 06/28/22 2259 06/29/22 0339 06/30/22 0421  NA  --   --    < > 134* 136 135  --  138 136  K  --   --    < > 2.7* 4.0 3.4*  --  3.3* 4.1  CL  --   --   --  95* 97* 98  --  102 105  CO2  --   --   --  27 28 26   --  26 26  GLUCOSE  --   --   --  165* 111* 109*  --  121* 102*  BUN  --   --   --  17 18 20   --  25* 13  CREATININE  --   --   --  1.44* 1.36* 1.46* 1.35* 1.33* 0.88  CALCIUM  --   --   --  9.0 9.1 9.0  --  8.6* 8.3*  MG 1.4* 2.4  --  2.0  --   --   --  1.9 1.7  PHOS  --   --   --   --   --   --   --  2.6 3.2   < > = values in this interval not displayed.    GFR: Estimated Creatinine Clearance: 155.9 mL/min (by C-G formula based on SCr of 0.88 mg/dL). Recent Labs  Lab 06/27/22 0319 06/27/22 1230 06/27/22 1440 06/28/22 2259 06/29/22 0339 06/30/22 0421  WBC 6.9  --   --  8.0 7.3 9.9  LATICACIDVEN  --  1.2 1.1  --   --   --         Critical care time:      Steffanie Dunn, DO 06/30/22 1:28 PM [facility]  For contact information, see Amion. If no response to pager, please call PCCM consult pager. After hours, 7PM- 7AM,  please call Elink.

## 2022-06-30 NOTE — Progress Notes (Signed)
OT Cancellation Note  Patient Details Name: Mark Garcia MRN: [id number] DOB: [birth year]   Cancelled Treatment:    Reason Eval/Treat Not Completed: OT screened, no needs identified, will sign off (per discussion with PT pt independent with no skilled OT needs. Will s/o. Please reconsult if there is a change in pt status.)  Carver Fila, OTD, OTR/L SecureChat Preferred Acute Rehab [phone]   Dalphine Handing 06/30/2022, 9:04 AM

## 2022-06-30 NOTE — Progress Notes (Signed)
Received from 2H, alert and oriented, no complaints of any pain or discomfort, family at bedside.The patient indicates understanding of these issues and agrees with the plan. With previous RN's assessment.

## 2022-06-30 NOTE — Evaluation (Signed)
Physical Therapy Evaluation/ Discharge Patient Details Name: Mark Garcia MRN: [id number] DOB: 1985-08-18 Today's Date: 06/30/2022  History of Present Illness  37 yo male adm 5/24 with SOB acute CHF with metabolic encephalopathy. Intubated 5/24-5/26. 5/25 NSTEMI s/Garcia LHC. PMhx: obesity, HTN, HFrEF, seizure d/o  Clinical Impression  Pt pleasant, has a 39yo and 39yo at home and works full time. Pt without hx of falls and able to perform all mobility without assist. Pt walking long hall distance and does not currently require further therapy intervention. Pt encouraged to continue walking acutely and will sign off with pt aware and agreeable.        Recommendations for follow up therapy are one component of a multi-disciplinary discharge planning process, led by the attending physician.  Recommendations may be updated based on patient status, additional functional criteria and insurance authorization.  Follow Up Recommendations       Assistance Recommended at Discharge None  Patient can return home with the following       Equipment Recommendations None recommended by PT  Recommendations for Other Services       Functional Status Assessment Patient has not had a recent decline in their functional status     Precautions / Restrictions Precautions Precautions: None      Mobility  Bed Mobility Overal bed mobility: Modified Independent                  Transfers Overall transfer level: Modified independent                      Ambulation/Gait Ambulation/Gait assistance: Independent Gait Distance (Feet): 800 Feet Assistive device: None Gait Pattern/deviations: WFL(Within Functional Limits)   Gait velocity interpretation: >4.37 ft/sec, indicative of normal walking speed      Stairs            Wheelchair Mobility    Modified Rankin (Stroke Patients Only)       Balance Overall balance assessment: Independent                                            Pertinent Vitals/Pain Pain Assessment Pain Assessment: No/denies pain    Home Living Family/patient expects to be discharged to:: Private residence Living Arrangements: Spouse/significant other;Children Available Help at Discharge: Family Type of Home: Apartment Home Access: Level entry       Home Layout: One level Home Equipment: None      Prior Function Prior Level of Function : Independent/Modified Independent;Working/employed;Driving                     Hand Dominance        Extremity/Trunk Assessment   Upper Extremity Assessment Upper Extremity Assessment: Overall WFL for tasks assessed    Lower Extremity Assessment Lower Extremity Assessment: Overall WFL for tasks assessed    Cervical / Trunk Assessment Cervical / Trunk Assessment: Normal  Communication   Communication: No difficulties  Cognition Arousal/Alertness: Awake/alert Behavior During Therapy: WFL for tasks assessed/performed Overall Cognitive Status: Within Functional Limits for tasks assessed                                          General Comments      Exercises     Assessment/Plan  PT Assessment Patient does not need any further PT services  PT Problem List         PT Treatment Interventions      PT Goals (Current goals can be found in the Care Plan section)  Acute Rehab PT Goals PT Goal Formulation: All assessment and education complete, DC therapy    Frequency       Co-evaluation               AM-PAC PT "6 Clicks" Mobility  Outcome Measure Help needed turning from your back to your side while in a flat bed without using bedrails?: None Help needed moving from lying on your back to sitting on the side of a flat bed without using bedrails?: None Help needed moving to and from a bed to a chair (including a wheelchair)?: None Help needed standing up from a chair using your arms (e.g., wheelchair or bedside chair)?:  None Help needed to walk in hospital room?: None Help needed climbing 3-5 steps with a railing? : None 6 Click Score: 24    End of Session   Activity Tolerance: Patient tolerated treatment well Patient left: in chair;with call bell/phone within reach Nurse Communication: Mobility status PT Visit Diagnosis: Other abnormalities of gait and mobility (R26.89)    Time: 4098-1191 PT Time Calculation (min) (ACUTE ONLY): 20 min   Charges:   PT Evaluation $PT Eval Low Complexity: 1 Low          Donda Friedli Garcia, PT Acute Rehabilitation Services Office: [phone]   Cristine Polio 06/30/2022, 8:52 AM

## 2022-06-30 NOTE — TOC Initial Note (Signed)
Transition of Care [facility]) - Initial/Assessment Note    Patient Details  Name: Mark Garcia MRN: [id number] Date of Birth: 1985-06-07  [facility]) CM/SW Contact:    Elliot Cousin, RN Phone Number: [phone] 06/30/2022, 4:54 PM  Clinical Narrative:   CM spoke to pt's significant other at bedside. Provided pt with a scale. States his PCP on Medicaid card is [facility]. Will have arrange follow up appt with PCP. SO states pt plans to apply for SS disability, he applied in the past but was denied. Will continue to follow for dc needs.                 Expected Discharge Plan: Home/Self Care Barriers to Discharge: Continued Medical Work up   Patient Goals and CMS Choice            Expected Discharge Plan and Services   Discharge Planning Services: CM Consult   Living arrangements for the past 2 months: Single Family Home                                      Prior Living Arrangements/Services Living arrangements for the past 2 months: Single Family Home Lives with:: Significant Other, Minor Children   Do you feel safe going back to the place where you live?: Yes      Need for Family Participation in Patient Care: No (Comment) Care giver support system in place?: Yes (comment)      Activities of Daily Living Home Assistive Devices/Equipment: None ADL Screening (condition at time of admission) Patient's cognitive ability adequate to safely complete daily activities?: No Is the patient deaf or have difficulty hearing?: No Does the patient have difficulty seeing, even when wearing glasses/contacts?: No Does the patient have difficulty concentrating, remembering, or making decisions?: No Patient able to express need for assistance with ADLs?: No Does the patient have difficulty dressing or bathing?: No Independently performs ADLs?: Yes (appropriate for developmental age) Does the patient have difficulty walking or climbing stairs?:  No Weakness of Legs: None Weakness of Arms/Hands: None  Permission Sought/Granted Permission sought to share information with : Case Manager, Family Supports                Emotional Assessment           Psych Involvement: No (comment)  Admission diagnosis:  Acute on chronic systolic congestive heart failure (HCC) [I50.23] Elevated troponin I level [R79.89] Acute on chronic systolic CHF (congestive heart failure) (HCC) [I50.23] Heart failure with reduced ejection fraction (HCC) [I50.20] Patient Active Problem List   Diagnosis Date Noted   Acute on chronic HFrEF (heart failure with reduced ejection fraction) (HCC) 06/30/2022   Acute respiratory failure with hypoxia and hypercapnia (HCC) 06/29/2022   On mechanically assisted ventilation (HCC) 06/28/2022   Elevated troponin I level 06/28/2022   AKI (acute kidney injury) (HCC) 06/28/2022   Acute on chronic systolic CHF (congestive heart failure) (HCC) 06/27/2022   Heart failure with reduced ejection fraction (HCC) 06/27/2022   Acute respiratory failure with hypoxia (HCC) 06/27/2022   Elevated troponin 06/27/2022   Nausea and vomiting 06/27/2022   Sinus bradycardia 06/27/2022   Essential hypertension 06/27/2022   Acute metabolic encephalopathy 06/27/2022   Acute systolic CHF (congestive heart failure) (HCC) 02/18/2019   Morbid obesity (HCC) 02/18/2019   Congestive heart failure (CHF) (HCC) 02/17/2019   Acute CHF (congestive heart  failure) (HCC) 02/17/2019   Hypertensive heart disease with heart failure [facility])    PCP:  Patient, No Pcp Per Pharmacy:   [id number] Ginette Otto, [place] - [address] AT [facility] [address] [place] [address] Phone: [phone] Fax: [phone]  [facility] [address] [place] [address] Phone: [phone] Fax: [phone]     Social Determinants of Health (SDOH) Social History: SDOH  Screenings   Food Insecurity: Patient Unable To Answer (06/27/2022)  Housing: Patient Unable To Answer (06/27/2022)  Transportation Needs: Patient Unable To Answer (06/27/2022)  Financial Resource Strain: High Risk (10/24/2020)  Tobacco Use: Medium Risk (06/27/2022)   SDOH Interventions:     Readmission Risk Interventions     No data to display

## 2022-06-30 NOTE — Progress Notes (Signed)
[facility] ADULT ICU REPLACEMENT PROTOCOL   The patient does apply for the [facility] Adult ICU Electrolyte Replacment Protocol based on the criteria listed below:   1.Exclusion criteria: TCTS, ECMO, Dialysis, and Myasthenia Gravis patients 2. Is GFR >/= 30 ml/min? Yes.    Patient's GFR today is >60 3. Is SCr </= 2? Yes.   Patient's SCr is 0.88 mg/dL 4. Did SCr increase >/= 0.5 in 24 hours? No. 5.Pt's weight >40kg  Yes.   6. Abnormal electrolyte(s): Mag  7. Electrolytes replaced per protocol 8.  Call MD STAT for K+ </= 2.5, Phos </= 1, or Mag </= 1 Physician:  Merryl Hacker [facility] 06/30/2022 5:58 AM

## 2022-06-30 NOTE — Progress Notes (Addendum)
ANTICOAGULATION CONSULT NOTE - Follow Up Consult  Pharmacy Consult for heparin > apixaban Indication: NSTEMI w/ possible LV thrombus  Labs: Recent Labs    06/27/22 1800 06/28/22 0028 06/28/22 0342 06/28/22 1205 06/28/22 1206 06/28/22 1418 06/28/22 1744 06/28/22 2259 06/29/22 0339 06/29/22 1140 06/29/22 2126 06/30/22 0421  HGB  --    < >  --   --   --   --   --  12.9* 12.8*  --   --  12.2*  HCT  --    < >  --   --   --   --   --  39.9 39.7  --   --  39.3  PLT  --   --   --   --   --   --   --  212 201  --   --  174  APTT 47*  --  47* 51*  --   --   --   --   --   --   --   --   HEPARINUNFRC  --   --  0.34  --    < >  --   --   --   --  0.15* 0.25* 0.44  CREATININE  --    < > 1.44*  --   --  1.36* 1.46* 1.35* 1.33*  --   --   --   TROPONINIHS 591*  --   --   --   --  1,107* 2,350*  --   --   --   --   --    < > = values in this interval not displayed.    Assessment/Plan:  36yo male therapeutic on heparin after rate change. Will continue infusion at current rate of 2400 units/hr and confirm stable with additional level.  Vernard Gambles, PharmD, BCPS 06/30/2022 5:04 AM    Addendum  Change heaprin to apixaban 5mg  BID age < 80 wt > 60kg and Cr stable 1    Leota Sauers Pharm.D. CPP, BCPS Clinical Pharmacist [phone] 06/30/2022 8:53 AM

## 2022-07-01 ENCOUNTER — Telehealth (HOSPITAL_COMMUNITY): Payer: Self-pay | Admitting: Pharmacy Technician

## 2022-07-01 ENCOUNTER — Other Ambulatory Visit (HOSPITAL_COMMUNITY): Payer: Self-pay

## 2022-07-01 ENCOUNTER — Encounter (HOSPITAL_COMMUNITY): Payer: Self-pay | Admitting: Cardiology

## 2022-07-01 DIAGNOSIS — R7989 Other specified abnormal findings of blood chemistry: Secondary | ICD-10-CM | POA: Diagnosis not present

## 2022-07-01 DIAGNOSIS — I214 Non-ST elevation (NSTEMI) myocardial infarction: Secondary | ICD-10-CM | POA: Diagnosis not present

## 2022-07-01 DIAGNOSIS — J810 Acute pulmonary edema: Secondary | ICD-10-CM

## 2022-07-01 DIAGNOSIS — I428 Other cardiomyopathies: Secondary | ICD-10-CM | POA: Diagnosis not present

## 2022-07-01 DIAGNOSIS — I5023 Acute on chronic systolic (congestive) heart failure: Secondary | ICD-10-CM | POA: Diagnosis not present

## 2022-07-01 DIAGNOSIS — I513 Intracardiac thrombosis, not elsewhere classified: Secondary | ICD-10-CM

## 2022-07-01 LAB — CBC
HCT: 39.2 % (ref 39.0–52.0)
Hemoglobin: 12.5 g/dL — ABNORMAL LOW (ref 13.0–17.0)
MCH: 27.5 pg (ref 26.0–34.0)
MCHC: 31.9 g/dL (ref 30.0–36.0)
MCV: 86.3 fL (ref 80.0–100.0)
Platelets: 173 10*3/uL (ref 150–400)
RBC: 4.54 MIL/uL (ref 4.22–5.81)
RDW: 13 % (ref 11.5–15.5)
WBC: 7.1 10*3/uL (ref 4.0–10.5)
nRBC: 0 % (ref 0.0–0.2)

## 2022-07-01 LAB — GLUCOSE, CAPILLARY
Glucose-Capillary: 97 mg/dL (ref 70–99)
Glucose-Capillary: 97 mg/dL (ref 70–99)

## 2022-07-01 LAB — BASIC METABOLIC PANEL
Anion gap: 6 (ref 5–15)
BUN: 10 mg/dL (ref 6–20)
CO2: 25 mmol/L (ref 22–32)
Calcium: 8.1 mg/dL — ABNORMAL LOW (ref 8.9–10.3)
Chloride: 102 mmol/L (ref 98–111)
Creatinine, Ser: 0.85 mg/dL (ref 0.61–1.24)
GFR, Estimated: >60 mL/min (ref >60)
Glucose, Bld: 177 mg/dL — ABNORMAL HIGH (ref 70–99)
Potassium: 4 mmol/L (ref 3.5–5.1)
Sodium: 133 mmol/L — ABNORMAL LOW (ref 135–145)

## 2022-07-01 LAB — HEMOGLOBIN A1C
Hgb A1c MFr Bld: 6.3 % — ABNORMAL HIGH (ref 4.8–5.6)
Mean Plasma Glucose: 134 mg/dL

## 2022-07-01 LAB — PHOSPHORUS: Phosphorus: 2.9 mg/dL (ref 2.5–4.6)

## 2022-07-01 LAB — MAGNESIUM: Magnesium: 1.6 mg/dL — ABNORMAL LOW (ref 1.7–2.4)

## 2022-07-01 MED ORDER — ATORVASTATIN CALCIUM 40 MG PO TABS
40.0000 mg | ORAL_TABLET | Freq: Every day | ORAL | 3 refills | Status: AC
  Filled 2022-07-01: qty 30, 30d supply, fill #0

## 2022-07-01 MED ORDER — AMOXICILLIN-POT CLAVULANATE 875-125 MG PO TABS
1.0000 | ORAL_TABLET | Freq: Two times a day (BID) | ORAL | 0 refills | Status: AC
  Filled 2022-07-01: qty 6, 3d supply, fill #0

## 2022-07-01 MED ORDER — APIXABAN 5 MG PO TABS
5.0000 mg | ORAL_TABLET | Freq: Two times a day (BID) | ORAL | 3 refills | Status: AC
  Filled 2022-07-01: qty 60, 30d supply, fill #0

## 2022-07-01 MED ORDER — SPIRONOLACTONE 12.5 MG HALF TABLET
12.5000 mg | ORAL_TABLET | Freq: Every day | ORAL | Status: DC
  Administered 2022-07-01: 12.5 mg via ORAL
  Filled 2022-07-01: qty 1

## 2022-07-01 MED ORDER — SPIRONOLACTONE 25 MG PO TABS
12.5000 mg | ORAL_TABLET | Freq: Every day | ORAL | 3 refills | Status: AC
  Filled 2022-07-01: qty 30, 60d supply, fill #0

## 2022-07-01 MED ORDER — CARVEDILOL 6.25 MG PO TABS
6.2500 mg | ORAL_TABLET | Freq: Two times a day (BID) | ORAL | 3 refills | Status: AC
  Filled 2022-07-01: qty 60, 30d supply, fill #0

## 2022-07-01 MED ORDER — EMPAGLIFLOZIN 10 MG PO TABS
10.0000 mg | ORAL_TABLET | Freq: Every day | ORAL | 3 refills | Status: AC
  Filled 2022-07-01: qty 30, 30d supply, fill #0

## 2022-07-01 MED ORDER — SACUBITRIL-VALSARTAN 24-26 MG PO TABS
1.0000 | ORAL_TABLET | Freq: Two times a day (BID) | ORAL | 3 refills | Status: AC
  Filled 2022-07-01: qty 60, 30d supply, fill #0

## 2022-07-01 MED ORDER — MAGNESIUM SULFATE 4 GM/100ML IV SOLN
4.0000 g | Freq: Once | INTRAVENOUS | Status: AC
  Administered 2022-07-01: 4 g via INTRAVENOUS
  Filled 2022-07-01 (×2): qty 100

## 2022-07-01 MED ORDER — CLOPIDOGREL BISULFATE 75 MG PO TABS
75.0000 mg | ORAL_TABLET | Freq: Every day | ORAL | 3 refills | Status: AC
  Filled 2022-07-01: qty 30, 30d supply, fill #0

## 2022-07-01 NOTE — Discharge Summary (Signed)
Physician Discharge Summary  Patient ID: Mark Garcia MRN: [id number] DOB/AGE: 1985-06-26 37 y.o.  Admit date: 06/27/2022 Discharge date: 07/01/2022  Admission Diagnoses: acute on chronic HFrEF, acute respiratory failure with hypoxia. acute pulmonary edema  Discharge Diagnoses:  Principal Problem:   Acute on chronic systolic CHF (congestive heart failure) (HCC) Active Problems:   Morbid obesity (HCC)   Acute respiratory failure with hypoxia (HCC)   Elevated troponin   Nausea and vomiting   Sinus bradycardia   Essential hypertension   Acute metabolic encephalopathy   On mechanically assisted ventilation (HCC)   Elevated troponin I level   AKI (acute kidney injury) (HCC)   Acute respiratory failure with hypoxia and hypercapnia (HCC)   Acute on chronic HFrEF (heart failure with reduced ejection fraction) (HCC) NSTEMI Nonischemic cardiomyopathy Obstructive sleep apnea Acute pulmonary edema Acute metabolic encephalopathy Left lower lobe aspiration pneumonia Hypomagnesemia Hypokalemia History of epilepsy prediabetes, A1c 6.3  Discharged Condition: stable  Hospital Course: Mr. Stables was admitted for acute heart failure. Due to vomiting he received multiple doses of anti-emetics and developed severe respiratory depression and hypoxic & hypercapnic respiratory failure that failed to respond to BiPAP. He was intubated for 2 days before extubation to [place] and was weaned back off oxygen. Due to dynamic EKG changes and rising troponins he underwent LHC for NSTEMI that demonstrated a distal embolus to the PDA.  No stenting was required. Cardiac MRI suggests an apical thrombus that may have been the source of the embolus. Cardiac MRI performed during this admission does not suggest sarcoidosis and amyloidosis; rather it confirms NICM. He had his GDMT medications adjusted and will have close follow up in 1 week in AHF clinic for ongoing med titration. New meds the day of discharge: jardiance,  spironolactone. He will not be discharged on a home lasix regimen, but pending changes in his weight this may be needed in the future.   He will complete his course of antibiotics for aspiration pneumonia as an outpatient.   The day of discharge his RUE PICC line was removed. He was walked prior to discharge to ensure he did not have exertional hypoxia. He will need a sleep study at discharge (recommend in-lab sleep study based on severe underlying heart failure rather than home sleep apnea test).   Consults: cardiology and pulmonary/intensive care  Significant Diagnostic Studies:  LHC 5/25:   Distal RPDA lesion is 90% stenosed -> thrombotic/embolic   LV end diastolic pressure is normal.   There is no aortic valve stenosis.   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 Essentially normal, large caliber patulous vessels very tortuous with a focal hazy thrombotic (possibly embolic) lesion in the very distal portion of the right PDA, otherwise no notable CAD the tortuosity. There was no culprit lesion unless this was the culprit lesion in the apical PDA.  More consistent however with thromboembolism. Relatively normal LVEDP of 16 mmHg    Cardiac MRI: 1. Severe LV dilation with diffuse hypokinesis, EF 21%. Mild concentric LV hypertrophy. 2.  Normal RV size with EF 40%. 3. LGE pattern suggests myocardial infarction involving the apical inferior wall. 4.  Suspect small LV thrombus apical inferior wall. 5. Elevated extracellular volume percentage at 41% with normal T2. LGE pattern (and age) are not suggestive of cardiac amyloidosis. The elevated ECV suggests increased fibrotic content in the LV myocardium. Most likely nonischemic cardiomyopathy, apical inferior infarction is not enough scar to  explain cardiomyopathy.  Blood culture: No growth x 4 days, still pending  Respiratory culture-normal  flora  Treatments:  Respiratory failure, aspiration pneumonia, acute pulmonary edema: Diuresis, mechanical ventilation> supplemental oxygen, antibiotics, bronchodilators. He will be discharged  Acute on chronic heart failure- diuresis, inotropic support, anticoagulation, antiplatelet medications, resumed GDMT   Prediabetes: Discharged on Jardiance, needs outpatient follow-up   Discharge Exam: Blood pressure 119/86, pulse 89, temperature 98.1 F (36.7 C), temperature source Oral, resp. rate 18, height 5\' 11"  (1.803 m), weight 132.8 kg, SpO2 99 %.  Healthy-appearing man lying in bed no acute distress Breathing comfortably on room air, CTAB. S1-S2, regular rate and rhythm Abdomen soft, nontender, nondistended. No lower extremity edema, no clubbing or cyanosis Skin warm, dry, no diffuse rashes.  No erythema around right upper extremity PICC line. Awake and alert, answering questions appropriately, moving all extremities spontaneously  Disposition: Home  Discharge Instructions     AMB referral to Phase II Cardiac Rehabilitation   Complete by: As directed    Transient STE without c/o CP -- distal rPDA thrombus   Diagnosis:  Heart Failure (see criteria below if ordering Phase II) NSTEMI Other     Heart Failure Type: Chronic Systolic & Diastolic   After initial evaluation and assessments completed: Virtual Based Care may be provided alone or in conjunction with Phase 2 Cardiac Rehab based on patient barriers.: Yes   Intensive Cardiac Rehabilitation (ICR) [facility] location only OR Traditional Cardiac Rehabilitation (TCR) *If criteria for ICR are not met will enroll in TCR [facility] only): Yes      Allergies as of 07/01/2022   No Known Allergies      Medication List     STOP taking these medications    Entresto 97-103 MG Generic drug: sacubitril-valsartan Replaced by: sacubitril-valsartan 24-26 MG       TAKE these medications    amoxicillin-clavulanate 875-125 MG  tablet Commonly known as: AUGMENTIN Take 1 tablet by mouth 2 (two) times daily for 3 days.   apixaban 5 MG Tabs tablet Commonly known as: ELIQUIS Take 1 tablet (5 mg total) by mouth 2 (two) times daily.   atorvastatin 40 MG tablet Commonly known as: LIPITOR Take 1 tablet (40 mg total) by mouth daily. Start taking on: Jul 02, 2022   carvedilol 6.25 MG tablet Commonly known as: COREG Take 1 tablet (6.25 mg total) by mouth 2 (two) times daily with a meal. What changed:  medication strength how much to take   clopidogrel 75 MG tablet Commonly known as: PLAVIX Place 1 tablet (75 mg total) into feeding tube daily with breakfast. Start taking on: Jul 02, 2022   empagliflozin 10 MG Tabs tablet Commonly known as: JARDIANCE Take 1 tablet (10 mg total) by mouth daily. Start taking on: Jul 02, 2022 What changed: when to take this   levETIRAcetam 500 MG tablet Commonly known as: KEPPRA Take three tablets (1,500 mg dose) by mouth 2 (two) times daily. What changed: Another medication with the same name was removed. Continue taking this medication, and follow the directions you see here.   sacubitril-valsartan 24-26 MG Commonly known as: ENTRESTO Take 1 tablet by mouth 2 (two) times daily. Replaces: Entresto 97-103 MG   spironolactone 25 MG tablet Commonly known as: ALDACTONE Take 0.5 tablets (12.5 mg total) by mouth daily. What changed: how much to take        Follow-up Information     [facility] Follow up on 07/10/2022.  Specialty: Cardiology Why: at 9:30 Contact information: [address] [id number] [place]he[place] [address] [phone]        [facility]. Go on 07/26/2022.   Why: @10 :45am Contact information: [address] [place] [address] [phone]                   Follow up recommendations: -Needs BMP when he follows up next week to  monitor renal function and electrolytes. -Needs to monitor weights and determine if loop diuretic regimen is also needed. -Has been referred for OP pulmonary rehab. -Needs outpatient follow-up for prediabetes from PCP -Needs outpatient sleep study (recommend in-lab study rather than HSAT)   Signed: Steffanie Dunn 07/01/2022, 12:23 PM   Discharge time: 50 minutes

## 2022-07-01 NOTE — Hospital Course (Addendum)
37 yo AAM with hx of obesity, CHF, HTN, seizure disorder, noncompliant with meds admitted on 5/24 with shortness of breath x 24 hs, in ED:BNP 1071. trop 153. no chest pain. mild pulmonary edema. no hypoxic.  Patient was being admitted for acute on chronic CHF cardio was consulted but developed somnolence and respiratory failure w/ hypercapnia, question antiemetics for nausea causing sedation, He got intubated 5/24, s/p heart cath 5/25 (no stents, had distal embolization of clot), and was extubated on 5/26, off inotropes and started back on GDMT.  Echo showed EF 20-25%.  Renal failure has improved. 5/28 transferred back to [facility] service

## 2022-07-01 NOTE — Progress Notes (Signed)
Heart Failure Navigator Progress Note  Assessed for Heart & Vascular TOC clinic readiness.  Patient does not meet criteria due to Advanced Heart Failure Team patient of Dr. Bensimhon.   Navigator will sign off at this time.   Tyishia Aune, BSN, RN Heart Failure Nurse Navigator Secure Chat Only   

## 2022-07-01 NOTE — Telephone Encounter (Signed)
Patient Advocate Encounter   Received notification that prior authorization for Jardiance 10MG  tablets is required.   PA submitted on 07/01/2022 Key [id number]  Insurance [facility] Medicaid of [facility] Electronic Prior Authorization Request Form Status is pending       Roland Earl, CPhT Pharmacy Patient Advocate Specialist [facility] Patient Advocate Team Direct Number: [phone]  Fax: [phone]

## 2022-07-01 NOTE — Discharge Instructions (Signed)
Information about your medication: Plavix (anti-platelet agent)  Generic Name (Brand): clopidogrel (Plavix), once daily medication  PURPOSE: You are taking this medication along with aspirin to lower your chance of having a heart attack, stroke, or blood clots in your heart stent. These can be fatal. Plavix and aspirin help prevent platelets from sticking together and forming a clot that can block an artery or your stent.   Common SIDE EFFECTS you may experience include: bruising or bleeding more easily, shortness of breath  Do not stop taking PLAVIX without talking to the doctor who prescribes it for you. People who are treated with a stent and stop taking Plavix too soon, have a higher risk of getting a blood clot in the stent, having a heart attack, or dying. If you stop Plavix because of bleeding, or for other reasons, your risk of a heart attack or stroke may increase.   Avoid taking NSAID agents or anti-inflammatory medications such as ibuprofen, naproxen given increased bleed risk with plavix - can use acetaminophen (Tylenol) if needed for pain.  Avoid taking over the counter stomach medications omeprazole (Prilosec) or esomeprazole (Nexium) since these do interact and make plavix less effective - ask your pharmacist or doctor for alterative agents if needed for heartburn or GERD.   Tell all of your doctors and dentists that you are taking Plavix. They should talk to the doctor who prescribed Plavix for you before you have any surgery or invasive procedure.   Contact your health care provider if you experience: severe or uncontrollable bleeding, pink/red/brown urine, vomiting blood or vomit that looks like "coffee grounds", red or black stools (looks like tar), coughing up blood or blood clots ----------------------------------------------------------------------------------------------------------------------   Information on my medicine - ELIQUIS (apixaban)  Why was Eliquis  prescribed for you? Eliquis was prescribed to treat blood clots that may have been found in your heart and to reduce the risk of them occurring again.  What do You need to know about Eliquis ? The dose is 5 mg tablet taken TWICE daily.  Eliquis may be taken with or without food.   Try to take the dose about the same time in the morning and in the evening. If you have difficulty swallowing the tablet whole please discuss with your pharmacist how to take the medication safely.  Take Eliquis exactly as prescribed and DO NOT stop taking Eliquis without talking to the doctor who prescribed the medication.  Stopping may increase your risk of developing a new blood clot.  Refill your prescription before you run out.  After discharge, you should have regular check-up appointments with your healthcare provider that is prescribing your Eliquis.    What do you do if you miss a dose? If a dose of ELIQUIS is not taken at the scheduled time, take it as soon as possible on the same day and twice-daily administration should be resumed. The dose should not be doubled to make up for a missed dose.  Important Safety Information A possible side effect of Eliquis is bleeding. You should call your healthcare provider right away if you experience any of the following: Bleeding from an injury or your nose that does not stop. Unusual colored urine (red or dark brown) or unusual colored stools (red or black). Unusual bruising for unknown reasons. A serious fall or if you hit your head (even if there is no bleeding).  Some medicines may interact with Eliquis and might increase your risk of bleeding or clotting while on Eliquis. To  help avoid this, consult your healthcare provider or pharmacist prior to using any new prescription or non-prescription medications, including herbals, vitamins, non-steroidal anti-inflammatory drugs (NSAIDs) and supplements.  This website has more information on Eliquis  (apixaban): http://www.eliquis.com/eliquis/home

## 2022-07-01 NOTE — Progress Notes (Signed)
Nurse requested Mobility Specialist to perform oxygen saturation test with pt which includes removing pt from oxygen both at rest and while ambulating.  Below are the results from that testing.     Patient Saturations on Room Air at Rest = spO2 95%  Patient Saturations on Room Air while Ambulating = sp02 94% .   Patient Saturations on N/A Liters of oxygen while Ambulating = sp02 N/A%  At end of testing pt left in room on RA    Reported results to nurse.     Thompson Grayer Mobility Specialist  Please contact vis Secure Chat or  Rehab Office [phone]

## 2022-07-01 NOTE — TOC Benefit Eligibility Note (Addendum)
Patient Product/process development scientist completed.    The patient is currently admitted and upon discharge could be taking Eliquis 5 mg.  The current 30 day co-pay is $4.00.   The patient is currently admitted and upon discharge could be taking Entresto 24-26 mg.  The current 30 day co-pay is $4.00.   The patient is currently admitted and upon discharge could be taking Farxiga 10 mg.  Requires Prior Authorization  The patient is currently admitted and upon discharge could be taking Jardiance 10 mg.  Requires Prior Authorization  The patient is insured through [employer]   This test claim was processed through [facility]- copay amounts may vary at other pharmacies due to pharmacy/plan contracts, or as the patient moves through the different stages of their insurance plan.  Mark Garcia, CPHT Pharmacy Patient Advocate Specialist [facility] Patient Advocate Team Direct Number: [phone]  Fax: [phone]

## 2022-07-01 NOTE — Progress Notes (Addendum)
Rounding Note    Patient Name: Mark Garcia Date of Encounter: 07/01/2022  [facility] Cardiologist: Peter Swaziland, MD   Subjective   Feels well, no complaints this AM  Inpatient Medications    Scheduled Meds:  apixaban  5 mg Oral BID   arformoterol  15 mcg Nebulization BID   atorvastatin  40 mg Oral Daily   carvedilol  6.25 mg Oral BID WC   Chlorhexidine Gluconate Cloth  6 each Topical Daily   clopidogrel  75 mg Per Tube Q breakfast   docusate  100 mg Per Tube BID   empagliflozin  10 mg Oral Daily   polyethylene glycol  17 g Per Tube Daily   revefenacin  175 mcg Nebulization Daily   sacubitril-valsartan  1 tablet Oral BID   sodium chloride flush  10-40 mL Intracatheter Q12H   sodium chloride flush  3 mL Intravenous Q12H   sodium chloride flush  3 mL Intravenous Q12H   Continuous Infusions:  sodium chloride Stopped (06/29/22 1713)   sodium chloride     cefTRIAXone (ROCEPHIN)  IV Stopped (06/30/22 2331)   levETIRAcetam Stopped (07/01/22 0340)   magnesium sulfate bolus IVPB     metronidazole Stopped (07/01/22 0653)   PRN Meds: sodium chloride, sodium chloride, acetaminophen **OR** acetaminophen, hydrALAZINE, ipratropium-albuterol, labetalol, ondansetron (ZOFRAN) IV, mouth rinse, prochlorperazine, sodium chloride flush, sodium chloride flush, sodium chloride flush   Vital Signs    Vitals:   07/01/22 0002 07/01/22 0449 07/01/22 0726 07/01/22 0747  BP: 104/80 117/87 (!) 116/95   Pulse: 87 77 83   Resp: 18 20 18    Temp: 98.7 F (37.1 C) 98.5 F (36.9 C) 98.1 F (36.7 C)   TempSrc: Oral Oral Oral   SpO2: 94% 97% 94% 98%  Weight:  132.8 kg    Height:        Intake/Output Summary (Last 24 hours) at 07/01/2022 0932 Last data filed at 07/01/2022 5621 Gross per 24 hour  Intake 1351.86 ml  Output 600 ml  Net 751.86 ml      07/01/2022    4:49 AM 06/29/2022    6:15 AM 06/28/2022    5:00 AM  Last 3 Weights  Weight (lbs) 292 lb 12.3 oz 290 lb 2 oz 287 lb  14.7 oz  Weight (kg) 132.8 kg 131.6 kg 130.6 kg      Telemetry    NSR with ST elevation HR 80s - Personally Reviewed  ECG    No new  Physical Exam   GEN: No acute distress.   Neck: No JVD Cardiac: RRR, no murmurs, rubs, or gallops.  Respiratory: Clear to auscultation bilaterally.  MS: No edema; No deformity. Psych: Normal affect   Labs    High Sensitivity Troponin:   Recent Labs  Lab 06/27/22 0919 06/27/22 1112 06/27/22 1800 06/28/22 1418 06/28/22 1744  TROPONINIHS 302* 330* 591* 1,107* 2,350*     Chemistry Recent Labs  Lab 06/27/22 2205 06/28/22 0028 06/29/22 0339 06/30/22 0421 07/01/22 0220 07/01/22 0835  NA  --    < > 138 136  --  133*  K  --    < > 3.3* 4.1  --  4.0  CL  --    < > 102 105  --  102  CO2  --    < > 26 26  --  25  GLUCOSE  --    < > 121* 102*  --  177*  BUN  --    < >  25* 13  --  10  CREATININE  --    < > 1.33* 0.88  --  0.85  CALCIUM  --    < > 8.6* 8.3*  --  8.1*  MG 2.4   < > 1.9 1.7 1.6*  --   PROT 7.3  --   --   --   --   --   ALBUMIN 3.5  --   --   --   --   --   AST 44*  --   --   --   --   --   ALT 42  --   --   --   --   --   ALKPHOS 44  --   --   --   --   --   BILITOT 0.7  --   --   --   --   --   GFRNONAA  --    < > >60 >60  --  >60  ANIONGAP  --    < > 10 5  --  6   < > = values in this interval not displayed.    Lipids  Recent Labs  Lab 06/27/22 0919 06/28/22 0342  CHOL 129  --   TRIG 64 442*  HDL 42  --   LDLCALC 74  --   CHOLHDL 3.1  --     Hematology Recent Labs  Lab 06/29/22 0339 06/30/22 0421 07/01/22 0220  WBC 7.3 9.9 7.1  RBC 4.51 4.44 4.54  HGB 12.8* 12.2* 12.5*  HCT 39.7 39.3 39.2  MCV 88.0 88.5 86.3  MCH 28.4 27.5 27.5  MCHC 32.2 31.0 31.9  RDW 13.3 13.5 13.0  PLT 201 174 173   Thyroid  Recent Labs  Lab 06/27/22 0933  TSH 2.244    BNP Recent Labs  Lab 06/27/22 0319  BNP 1,071.2*     Radiology    MR CARDIAC VELOCITY FLOW MAP  Result Date: 06/30/2022 CLINICAL DATA:   Cardiomyopathy of uncertain etiology EXAM: CARDIAC MRI TECHNIQUE: The patient was scanned on a 1.5 Tesla GE magnet. A dedicated cardiac coil was used. Functional imaging was done using Fiesta sequences. 2,3, and 4 chamber views were done to assess for RWMA's. Modified Simpson's rule using a short axis stack was used to calculate an ejection fraction on a dedicated work [employer]. The patient received 10 cc of Gadavist. After 10 minutes inversion recovery sequences were used to assess for infiltration and scar tissue. FINDINGS: On limited images of the lung fields, there was bibasilar airspace disease, atelectasis versus infiltrate. Trivial pericardial effusion. Severe left ventricular dilation with mild concentric LV hypertrophy. Diffuse hypokinesis, EF 21%. Normal right ventricular size with mild hypokinesis, EF 40%. Moderate left atrial enlargement. Mild right atrial enlargement. Trileaflet aortic valve, no significant stenosis or regurgitation. There was mild mitral regurgitation with regurgitant fraction 15%. On delayed enhancement imaging, there was >50% wall thickness subendocardial late gadolinium enhancement (LGE) in the apical inferior wall. From delayed enhancement images, there may be a small thrombus along the apical inferior wall. MEASUREMENTS: MEASUREMENTS LVEDV 578 mL LVEDVi 225 mL/m2 LVSV 123 mL LVEF 21% RVEDV 292 mL RVEDVi 114 mL/m2 RVSV 116 mL RVEF 40% Aortic forward volume 104 mL Aortic regurgitant fraction 1% T1 1163, ECV 41% T2 48-49 IMPRESSION: 1. Severe LV dilation with diffuse hypokinesis, EF 21%. Mild concentric LV hypertrophy. 2.  Normal RV size with EF 40%. 3. LGE pattern suggests myocardial infarction involving  the apical inferior wall. 4.  Suspect small LV thrombus apical inferior wall. 5. Elevated extracellular volume percentage at 41% with normal T2. LGE pattern (and age) are not suggestive of cardiac amyloidosis. The elevated ECV suggests increased fibrotic content  in the LV myocardium. Most likely nonischemic cardiomyopathy, apical inferior infarction is not enough scar to explain cardiomyopathy. Dalton Mclean Electronically Signed   By: Marca Ancona M.D.   On: 06/30/2022 22:01   MR CARDIAC VELOCITY FLOW MAP  Result Date: 06/30/2022 CLINICAL DATA:  Cardiomyopathy of uncertain etiology EXAM: CARDIAC MRI TECHNIQUE: The patient was scanned on a 1.5 Tesla GE magnet. A dedicated cardiac coil was used. Functional imaging was done using Fiesta sequences. 2,3, and 4 chamber views were done to assess for RWMA's. Modified Simpson's rule using a short axis stack was used to calculate an ejection fraction on a dedicated work [employer] officer, [employer]. The patient received 10 cc of Gadavist. After 10 minutes inversion recovery sequences were used to assess for infiltration and scar tissue. FINDINGS: On limited images of the lung fields, there was bibasilar airspace disease, atelectasis versus infiltrate. Trivial pericardial effusion. Severe left ventricular dilation with mild concentric LV hypertrophy. Diffuse hypokinesis, EF 21%. Normal right ventricular size with mild hypokinesis, EF 40%. Moderate left atrial enlargement. Mild right atrial enlargement. Trileaflet aortic valve, no significant stenosis or regurgitation. There was mild mitral regurgitation with regurgitant fraction 15%. On delayed enhancement imaging, there was >50% wall thickness subendocardial late gadolinium enhancement (LGE) in the apical inferior wall. From delayed enhancement images, there may be a small thrombus along the apical inferior wall. MEASUREMENTS: MEASUREMENTS LVEDV 578 mL LVEDVi 225 mL/m2 LVSV 123 mL LVEF 21% RVEDV 292 mL RVEDVi 114 mL/m2 RVSV 116 mL RVEF 40% Aortic forward volume 104 mL Aortic regurgitant fraction 1% T1 1163, ECV 41% T2 48-49 IMPRESSION: 1. Severe LV dilation with diffuse hypokinesis, EF 21%. Mild concentric LV hypertrophy. 2.  Normal RV size with EF 40%. 3. LGE pattern  suggests myocardial infarction involving the apical inferior wall. 4.  Suspect small LV thrombus apical inferior wall. 5. Elevated extracellular volume percentage at 41% with normal T2. LGE pattern (and age) are not suggestive of cardiac amyloidosis. The elevated ECV suggests increased fibrotic content in the LV myocardium. Most likely nonischemic cardiomyopathy, apical inferior infarction is not enough scar to explain cardiomyopathy. Dalton Mclean Electronically Signed   By: Marca Ancona M.D.   On: 06/30/2022 22:01   MR CARDIAC MORPHOLOGY W WO CONTRAST  Result Date: 06/30/2022 CLINICAL DATA:  Cardiomyopathy of uncertain etiology EXAM: CARDIAC MRI TECHNIQUE: The patient was scanned on a 1.5 Tesla GE magnet. A dedicated cardiac coil was used. Functional imaging was done using Fiesta sequences. 2,3, and 4 chamber views were done to assess for RWMA's. Modified Simpson's rule using a short axis stack was used to calculate an ejection fraction on a dedicated work [employer] officer, [employer]. The patient received 10 cc of Gadavist. After 10 minutes inversion recovery sequences were used to assess for infiltration and scar tissue. FINDINGS: On limited images of the lung fields, there was bibasilar airspace disease, atelectasis versus infiltrate. Trivial pericardial effusion. Severe left ventricular dilation with mild concentric LV hypertrophy. Diffuse hypokinesis, EF 21%. Normal right ventricular size with mild hypokinesis, EF 40%. Moderate left atrial enlargement. Mild right atrial enlargement. Trileaflet aortic valve, no significant stenosis or regurgitation. There was mild mitral regurgitation with regurgitant fraction 15%. On delayed enhancement imaging, there was >50% wall thickness subendocardial late gadolinium enhancement (  LGE) in the apical inferior wall. From delayed enhancement images, there may be a small thrombus along the apical inferior wall. MEASUREMENTS: MEASUREMENTS LVEDV 578 mL LVEDVi 225 mL/m2  LVSV 123 mL LVEF 21% RVEDV 292 mL RVEDVi 114 mL/m2 RVSV 116 mL RVEF 40% Aortic forward volume 104 mL Aortic regurgitant fraction 1% T1 1163, ECV 41% T2 48-49 IMPRESSION: 1. Severe LV dilation with diffuse hypokinesis, EF 21%. Mild concentric LV hypertrophy. 2.  Normal RV size with EF 40%. 3. LGE pattern suggests myocardial infarction involving the apical inferior wall. 4.  Suspect small LV thrombus apical inferior wall. 5. Elevated extracellular volume percentage at 41% with normal T2. LGE pattern (and age) are not suggestive of cardiac amyloidosis. The elevated ECV suggests increased fibrotic content in the LV myocardium. Most likely nonischemic cardiomyopathy, apical inferior infarction is not enough scar to explain cardiomyopathy. Dalton Mclean Electronically Signed   By: Marca Ancona M.D.   On: 06/30/2022 22:01    Cardiac Studies   Echo 06/27/22: LVEF 20 to 25%.  Severely decreased.  Global HK with no RWMA.  Moderately dilated.  Mild concentric LVH.  GR 2 DD.  Mild LA dilation.  Normal RV function with normal RVEF PE and mildly elevated RAP with dilated IVC.Marland Kitchen  Normal valves.  LHC 06/28/22  Distal RPDA lesion is 90% stenosed -> thrombotic/embolic   LV end diastolic pressure is normal.   There is no aortic valve stenosis.   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 Essentially normal, large caliber patulous vessels very tortuous with a focal hazy thrombotic (possibly embolic) lesion in the very distal portion of the right PDA, otherwise no notable CAD the tortuosity. There was no culprit lesion unless this was the culprit lesion in the apical PDA.  More consistent however with thromboembolism. Relatively normal LVEDP of 16 mmHg   Cardiac MRI 06/30/22  1. Severe LV dilation with diffuse hypokinesis, EF 21%. Mild concentric LV hypertrophy.   2.  Normal RV size with EF 40%.   3. LGE pattern suggests  myocardial infarction involving the apical inferior wall.   4.  Suspect small LV thrombus apical inferior wall.   5. Elevated extracellular volume percentage at 41% with normal T2. LGE pattern (and age) are not suggestive of cardiac amyloidosis. The elevated ECV suggests increased fibrotic content in the LV myocardium.   Most likely nonischemic cardiomyopathy, apical inferior infarction is not enough scar to explain cardiomyopathy.  Patient Profile     Macade Robarts is a 37 y.o. male with a hx of chronic HFrEF, HTN, morbid obesity, suspected OSA, seizures (prior ED visits for breakthrough, previously followed by neurology at [facility]), pre-DM (A1C 6 in 2021) who was seen 06/27/2022 for the evaluation of CHF at the request of Dr. Katrinka Blazing.   Assessment & Plan    Acute on chronic HFrEF (Acute on Chronic Combined Systolic and Diastolic Heart Failure, Secondary to Nonischemic Cardiomyopathy) -- diagnosed 2021, recent non-compliance with medications -- Echo 06/27/22 showed worsened EF 20-25% -- GDMT resumed. Continue entresto 24/26mg  BID, coreg 6.25mg  BID, Jardiance 10mg  daily -- euvolemic on exam, no diuretic needed at this time => would discharge with PRN Lasix 40 mg for weight gain more than 3 pounds.  He should weigh himself once he gets home and that would be his dry weight.  Acute Inferior STEMI -- distal R PDA thrombotic lesion found of LHC 06/28/22 -> cardiac MRI suggested small LV  apical thrombus, most likely etiology of PDA occlusion would be cardioembolic. -- Started on Eliquis for LV thrombus -- continue plavix 75mg  daily monotherapy just in case there was plaque rupture; no aspirin because the need for DOAC   Small LV thrombus -- -- Small LV thrombus found on cardiac MRI 06/30/22 -- continue eliquis 5mg  BID  -- recheck Echo with contrast in 3mos   Acute hypoxic respiratory failure  -- resolved with improved fluid status  Encephalopathy -- resolved  AKI -- creatinine 0.85 this AM   -- continue to monitor  [facility] will sign off.   Medication Recommendations:  Continue entresto 24/26mg  BID, coreg 6.25mg  BID, Jardiance 10mg  daily, eliquis 5mg  BID and plavix 75mg  daily; furosemide 40 mg p.o. daily as needed weight gain greater than 3 pounds.  He should weigh himself upon returning home Other recommendations (labs, testing, etc):  N/A Follow up as an outpatient:  [facility] 07/10/2022 @ 9:30  For questions or updates, please contact  [facility] Please consult [email or web address] for contact info under        Signed, Osborne Oman, RN Student Nurse Practitioner 07/01/2022, 9:32 AM      ATTENDING ATTESTATION  I have seen, examined and evaluated the patient this morning along with  Deniece Ree, RN/NP Student and Laverda Page, NP are (supervising NP).  After reviewing all the available data and chart, we discussed the patients laboratory, study & physical findings as well as symptoms in detail.  I agree with her findings, examination as well as impression recommendations as per our discussion.    Attending adjustments noted in italics.   Looks remarkably well this morning, does appear to be euvolemic.  We talked the importance of staying on his medications.  He is also spoke in detail about the medications with our pharmacist.  Agree with the plan for discharge on current meds.  We are using Plavix monotherapy in addition to DOAC to treat for combination of ACS and likely thromboembolic event.  Cardiac MRI does show inferior infarct but also severely reduced EF consistent with nonischemic cardiomyopathy which is well-known. Based on the fact that he did present with acute heart failure symptoms, would recommend at least PRN Lasix on discharge as noted.  I think he is stable for discharge today.  Have discussed with hospitalist team.    Marykay Lex, MD, MS Bryan Lemma, M.D., M.S. Interventional Cardiologist  [facility]   Pager # [phone] Phone # [phone] [address] [place], [place] [address]

## 2022-07-01 NOTE — Telephone Encounter (Signed)
Patient Advocate Encounter  Prior Authorization for Jardiance 10 mg has been approved.    PA# [id number] Insurance [facility] Medicaid of [facility] Electronic Prior Authorization Request Form  Effective dates: 07/01/2022 through 07/01/2023  Patients co-pay is $4.00.     Roland Earl, CPhT Pharmacy Patient Advocate Specialist [facility] Patient Advocate Team Direct Number: [phone]  Fax: [phone]

## 2022-07-01 NOTE — Progress Notes (Signed)
Mobility Specialist Progress Note:    07/01/22 1354  Mobility  Activity Ambulated independently in hallway  Level of Assistance Independent  Assistive Device None  Distance Ambulated (ft) 250 ft  Activity Response Tolerated well  Mobility Referral Yes  $Mobility charge 1 Mobility  Mobility Specialist Start Time (ACUTE ONLY) 1325  Mobility Specialist Stop Time (ACUTE ONLY) 1333  Mobility Specialist Time Calculation (min) (ACUTE ONLY) 8 min   Pt received ambulating in room, agreeable to mobilize further. No c/o throughout. Pt returned to room sitting EOB with call bell at hand. VSS on RA.   Thompson Grayer Mobility Specialist  Please contact vis Secure Chat or  Rehab Office [phone]

## 2022-07-01 NOTE — TOC Transition Note (Signed)
Transition of Care [facility]) - CM/SW Discharge Note   Patient Details  Name: Mark Garcia MRN: [id number] Date of Birth: [birth year]  [facility] of [facility] CM/SW Contact:  Leone Haven, RN Phone Number: 07/01/2022, 12:35 PM   Clinical Narrative:    Patient is for dc today, he states his grandmother and mother will be transporting him home.  TOC to fill medications.  He has no needs.     Barriers to Discharge: Continued Medical Work up   Patient Goals and CMS Choice      Discharge Placement                         Discharge Plan and Services Additional resources added to the After Visit Summary for     Discharge Planning Services: CM Consult                                 Social Determinants of Health (SDOH) Interventions SDOH Screenings   Food Insecurity: Patient Unable To Answer (06/27/2022)  Housing: Patient Unable To Answer (06/27/2022)  Transportation Needs: Patient Unable To Answer (06/27/2022)  Financial Resource Strain: High Risk (10/24/2020)  Tobacco Use: Medium Risk (07/01/2022)     Readmission Risk Interventions     No data to display

## 2022-07-02 LAB — CULTURE, BLOOD (SINGLE): Culture: NO GROWTH

## 2022-07-04 LAB — LIPOPROTEIN A (LPA): Lipoprotein (a): 68.6 nmol/L — ABNORMAL HIGH (ref <75.0)

## 2022-07-04 NOTE — Progress Notes (Signed)
CARDIAC REHAB PHASE I    Unable to reach via phone. Will mail post MI education to address on file. Referral has been  sent to [facility] for CRP2.   Woodroe Chen, RN BSN 07/04/2022 2:57 PM

## 2022-07-08 ENCOUNTER — Telehealth (HOSPITAL_COMMUNITY): Payer: Self-pay

## 2022-07-08 ENCOUNTER — Ambulatory Visit: Payer: Medicaid Other | Admitting: Student

## 2022-07-08 DIAGNOSIS — I5020 Unspecified systolic (congestive) heart failure: Secondary | ICD-10-CM

## 2022-07-08 NOTE — Telephone Encounter (Signed)
Attempted to call patient in regards to Cardiac Rehab - LM on VM 

## 2022-07-10 ENCOUNTER — Inpatient Hospital Stay (HOSPITAL_COMMUNITY): Admit: 2022-07-10 | Discharge: 2022-07-10 | Disposition: A | Discharge status: Home / Self Care | Payer: Medicaid Other

## 2022-08-04 DEATH — deceased
